# Patient Record
Sex: Female | Born: 1980
Health system: Southern US, Community
[De-identification: ages and names within clinical notes are randomized; demographics above are authoritative.]

## PROBLEM LIST (undated history)

## (undated) DIAGNOSIS — T8859XA Other complications of anesthesia, initial encounter: Secondary | ICD-10-CM

## (undated) DIAGNOSIS — E119 Type 2 diabetes mellitus without complications: Secondary | ICD-10-CM

## (undated) DIAGNOSIS — J45990 Exercise induced bronchospasm: Secondary | ICD-10-CM

## (undated) DIAGNOSIS — Z794 Long term (current) use of insulin: Secondary | ICD-10-CM

## (undated) DIAGNOSIS — F40240 Claustrophobia: Secondary | ICD-10-CM

## (undated) DIAGNOSIS — N200 Calculus of kidney: Secondary | ICD-10-CM

## (undated) DIAGNOSIS — Z87442 Personal history of urinary calculi: Secondary | ICD-10-CM

## (undated) DIAGNOSIS — R3915 Urgency of urination: Secondary | ICD-10-CM

## (undated) DIAGNOSIS — R350 Frequency of micturition: Secondary | ICD-10-CM

## (undated) DIAGNOSIS — K219 Gastro-esophageal reflux disease without esophagitis: Secondary | ICD-10-CM

## (undated) DIAGNOSIS — G629 Polyneuropathy, unspecified: Secondary | ICD-10-CM

## (undated) DIAGNOSIS — N281 Cyst of kidney, acquired: Secondary | ICD-10-CM

## (undated) DIAGNOSIS — Z8719 Personal history of other diseases of the digestive system: Secondary | ICD-10-CM

## (undated) DIAGNOSIS — Z973 Presence of spectacles and contact lenses: Secondary | ICD-10-CM

## (undated) DIAGNOSIS — F319 Bipolar disorder, unspecified: Secondary | ICD-10-CM

## (undated) DIAGNOSIS — R109 Unspecified abdominal pain: Secondary | ICD-10-CM

## (undated) DIAGNOSIS — T4145XA Adverse effect of unspecified anesthetic, initial encounter: Secondary | ICD-10-CM

## (undated) DIAGNOSIS — Z87440 Personal history of urinary (tract) infections: Secondary | ICD-10-CM

## (undated) HISTORY — PX: KNEE ARTHROSCOPY: SUR90

---
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
## 2001-07-12 ENCOUNTER — Emergency Department (HOSPITAL_COMMUNITY): Admission: EM | Admit: 2001-07-12 | Discharge: 2001-07-12 | Payer: Self-pay | Admitting: Emergency Medicine

## 2001-09-03 ENCOUNTER — Emergency Department (HOSPITAL_COMMUNITY): Admission: EM | Admit: 2001-09-03 | Discharge: 2001-09-03 | Payer: Self-pay

## 2002-02-03 ENCOUNTER — Emergency Department (HOSPITAL_COMMUNITY): Admission: EM | Admit: 2002-02-03 | Discharge: 2002-02-04 | Payer: Self-pay | Admitting: Emergency Medicine

## 2002-04-18 ENCOUNTER — Emergency Department (HOSPITAL_COMMUNITY): Admission: EM | Admit: 2002-04-18 | Discharge: 2002-04-18 | Payer: Self-pay | Admitting: *Deleted

## 2002-11-04 ENCOUNTER — Emergency Department (HOSPITAL_COMMUNITY): Admission: EM | Admit: 2002-11-04 | Discharge: 2002-11-04 | Payer: Self-pay | Admitting: Emergency Medicine

## 2003-10-06 ENCOUNTER — Emergency Department (HOSPITAL_COMMUNITY): Admission: EM | Admit: 2003-10-06 | Discharge: 2003-10-06 | Payer: Self-pay | Admitting: Emergency Medicine

## 2004-03-14 ENCOUNTER — Inpatient Hospital Stay (HOSPITAL_COMMUNITY): Admission: AD | Admit: 2004-03-14 | Discharge: 2004-03-14 | Payer: Self-pay | Admitting: Family Medicine

## 2004-08-09 ENCOUNTER — Other Ambulatory Visit: Admission: RE | Admit: 2004-08-09 | Discharge: 2004-08-09 | Payer: Self-pay | Admitting: Obstetrics and Gynecology

## 2004-12-28 ENCOUNTER — Inpatient Hospital Stay (HOSPITAL_COMMUNITY): Admission: AD | Admit: 2004-12-28 | Discharge: 2004-12-28 | Payer: Self-pay | Admitting: Obstetrics and Gynecology

## 2005-01-03 ENCOUNTER — Inpatient Hospital Stay (HOSPITAL_COMMUNITY): Admission: AD | Admit: 2005-01-03 | Discharge: 2005-01-04 | Payer: Self-pay | Admitting: Obstetrics and Gynecology

## 2005-01-22 ENCOUNTER — Inpatient Hospital Stay (HOSPITAL_COMMUNITY): Admission: AD | Admit: 2005-01-22 | Discharge: 2005-01-24 | Payer: Self-pay | Admitting: Obstetrics and Gynecology

## 2005-02-02 ENCOUNTER — Encounter: Admission: RE | Admit: 2005-02-02 | Discharge: 2005-03-04 | Payer: Self-pay | Admitting: Obstetrics and Gynecology

## 2005-09-07 ENCOUNTER — Other Ambulatory Visit: Admission: RE | Admit: 2005-09-07 | Discharge: 2005-09-07 | Payer: Self-pay | Admitting: Obstetrics and Gynecology

## 2006-02-09 ENCOUNTER — Emergency Department (HOSPITAL_COMMUNITY): Admission: EM | Admit: 2006-02-09 | Discharge: 2006-02-09 | Payer: Self-pay | Admitting: Emergency Medicine

## 2006-05-07 ENCOUNTER — Inpatient Hospital Stay (HOSPITAL_COMMUNITY): Admission: AD | Admit: 2006-05-07 | Discharge: 2006-05-07 | Payer: Self-pay | Admitting: Obstetrics and Gynecology

## 2006-10-02 ENCOUNTER — Encounter: Admission: RE | Admit: 2006-10-02 | Discharge: 2006-10-02 | Payer: Self-pay | Admitting: Obstetrics and Gynecology

## 2006-10-22 ENCOUNTER — Inpatient Hospital Stay (HOSPITAL_COMMUNITY): Admission: AD | Admit: 2006-10-22 | Discharge: 2006-10-23 | Payer: Self-pay | Admitting: Obstetrics and Gynecology

## 2006-11-16 ENCOUNTER — Inpatient Hospital Stay (HOSPITAL_COMMUNITY): Admission: AD | Admit: 2006-11-16 | Discharge: 2006-11-18 | Payer: Self-pay | Admitting: Obstetrics and Gynecology

## 2009-05-29 ENCOUNTER — Ambulatory Visit (HOSPITAL_COMMUNITY): Admission: RE | Admit: 2009-05-29 | Discharge: 2009-05-29 | Payer: Self-pay | Admitting: Obstetrics and Gynecology

## 2009-06-15 ENCOUNTER — Inpatient Hospital Stay (HOSPITAL_COMMUNITY): Admission: RE | Admit: 2009-06-15 | Discharge: 2009-06-17 | Payer: Self-pay | Admitting: Obstetrics and Gynecology

## 2009-09-05 ENCOUNTER — Emergency Department (HOSPITAL_COMMUNITY): Admission: EM | Admit: 2009-09-05 | Discharge: 2009-09-05 | Payer: Self-pay | Admitting: Emergency Medicine

## 2010-01-27 ENCOUNTER — Emergency Department (HOSPITAL_COMMUNITY): Admission: EM | Admit: 2010-01-27 | Discharge: 2010-01-27 | Payer: Self-pay | Admitting: Emergency Medicine

## 2010-01-28 ENCOUNTER — Ambulatory Visit (HOSPITAL_COMMUNITY): Admission: RE | Admit: 2010-01-28 | Discharge: 2010-01-28 | Payer: Self-pay | Admitting: Gastroenterology

## 2010-01-28 HISTORY — PX: ERCP W/ SPHICTEROTOMY: SHX1523

## 2010-01-29 ENCOUNTER — Inpatient Hospital Stay (HOSPITAL_COMMUNITY): Admission: EM | Admit: 2010-01-29 | Discharge: 2010-02-21 | Payer: Self-pay | Admitting: Emergency Medicine

## 2010-03-11 ENCOUNTER — Encounter: Admission: RE | Admit: 2010-03-11 | Discharge: 2010-03-11 | Payer: Self-pay | Admitting: Gastroenterology

## 2010-03-23 ENCOUNTER — Ambulatory Visit (HOSPITAL_COMMUNITY): Admission: RE | Admit: 2010-03-23 | Discharge: 2010-03-23 | Payer: Self-pay | Admitting: Gastroenterology

## 2010-03-25 ENCOUNTER — Emergency Department (HOSPITAL_COMMUNITY): Admission: EM | Admit: 2010-03-25 | Discharge: 2010-03-25 | Payer: Self-pay | Admitting: Emergency Medicine

## 2010-03-26 ENCOUNTER — Observation Stay (HOSPITAL_COMMUNITY): Admission: EM | Admit: 2010-03-26 | Discharge: 2010-03-28 | Payer: Self-pay | Admitting: Emergency Medicine

## 2010-07-24 ENCOUNTER — Encounter: Payer: Self-pay | Admitting: Obstetrics and Gynecology

## 2010-07-25 ENCOUNTER — Ambulatory Visit (HOSPITAL_COMMUNITY)
Admission: RE | Admit: 2010-07-25 | Discharge: 2010-07-25 | Payer: Self-pay | Source: Home / Self Care | Attending: General Surgery | Admitting: General Surgery

## 2010-07-25 HISTORY — PX: LAPAROSCOPIC CHOLECYSTECTOMY: SUR755

## 2010-07-25 LAB — COMPREHENSIVE METABOLIC PANEL
Albumin: 3.4 g/dL — ABNORMAL LOW (ref 3.5–5.2)
BUN: 5 mg/dL — ABNORMAL LOW (ref 6–23)
Calcium: 9.5 mg/dL (ref 8.4–10.5)
GFR calc Af Amer: >60 mL/min (ref >60)
Glucose, Bld: 109 mg/dL — ABNORMAL HIGH (ref 70–99)
Potassium: 4.2 mEq/L (ref 3.5–5.1)
Total Bilirubin: 0.3 mg/dL (ref 0.3–1.2)

## 2010-07-25 LAB — CBC
HCT: 36.5 % (ref 36.0–46.0)
Hemoglobin: 12 g/dL (ref 12.0–15.0)
MCHC: 32.9 g/dL (ref 30.0–36.0)
MCV: 86.5 fL (ref 78.0–100.0)

## 2010-07-25 LAB — SURGICAL PCR SCREEN
MRSA, PCR: NEGATIVE
Staphylococcus aureus: NEGATIVE

## 2010-07-26 NOTE — Op Note (Signed)
NAMEKENNIA, [name]              ACCOUNT NO.:  [id number]  MEDICAL RECORD NO.:  [id number]          PATIENT TYPE:  AMB  LOCATION:  SDS                          FACILITY:  [facility]  PHYSICIAN:  Anne Dare. Janee Ortiz, M.D.DATE OF BIRTH:  1981-03-29  DATE OF PROCEDURE:  07/25/2010 DATE OF DISCHARGE:  07/25/2010                              OPERATIVE REPORT   PREOPERATIVE DIAGNOSIS:  Biliary pancreatitis.  POSTOPERATIVE DIAGNOSIS:  Biliary pancreatitis.  PROCEDURE:  Laparoscopic cholecystectomy.  SURGEON:  Anne Dare. Janee Morn, MD.  ASSISTANT:  Anselm Pancoast. Zachery Dakins, MD.  HISTORY OF PRESENT ILLNESS:  [name] is a 30 year old female who had a common bile duct stone.  She had an ERCP with significant postoperative pancreatitis.  We were allowing this to resolve prior to removing her gallbladder when she also became pregnant.  She has continued to have right upper quadrant symptoms.  We will wait until her second trimester of pregnancy in order to proceed with surgery.  She presents today for elective cholecystectomy.  She has had some recent increase in her psychiatric issues; however, these have been brought under control with adjustments in her medication.  PROCEDURE IN DETAIL:  The patient was identified in the preop holding area.  Informed consent was obtained.  She received intravenous antibiotics.  She was brought to the operating room.  General endotracheal anesthesia was administered by anesthesia staff.  Her abdomen was prepped and draped in sterile fashion.  We did time-out procedure.  Supraumbilical region was infiltrated with 0.25% Marcaine with epinephrine.  Supraumbilical incision was made.  Subcutaneous tissues were dissected down revealing the anterior fascia.  This was divided sharply along the midline and the peritoneal cavity is entered under direct vision without difficulty and 0 Vicryl pursestring suture was placed around the fascial opening and the [facility] trocar  was inserted into the abdomen.  The abdomen was insufflated with carbon dioxide in a standard fashion.  Under direct vision, a 5-mm epigastric and two 5-mm lateral ports were placed.  Local anesthetic was used at these port sites as well.  Laparoscopic exploration revealed the gallbladder with some significant filmy adhesions from the omentum and duodenum.  These were carefully and gradually swept down.  They also were stuck up to the liver somewhat.  We were able to clear all of that away without causing any damage.  This allowed [place] to visualize the infundibulum.  Dissection began laterally and progressed medially.  We continued dissecting until a large window was created between the cystic duct and the infundibulum of the gallbladder and liver.  Once we had excellent visualization with critical view, decision was made to forego the cholangiogram as we had good visualization of the anatomy and due to the patient's pregnancy, so the cystic duct was clipped three times proximally and once distally and divided.  There was anterior branch in the cystic artery.  This was dissected around, clipped twice proximally, once distally, and divided. We then began taking the gallbladder off the liver bed with cautery.  We did encounter a posterior branch of cystic artery which was clipped twice proximally and divided distally with cautery.  The  gallbladder was taken off the liver bed and rest away with Bovie cautery and placed in an EndoCatch bag intact and removed from the abdomen via the infraumbilical port site.  We then irrigated the liver bed.  We ensured meticulous hemostasis.  All clips remained in good position.  The irrigation fluid returned clear.  Liver bed was checked one more time and it remained dry.  The ports were removed under direct vision. Pneumoperitoneum was released.  Infraumbilical fascia was closed by tying the 0 Vicryl pursestring suture with care not to trap any  intra- abdominal contents.  All four wounds were copiously irrigated.  The skin of each was closed with running 4-0 Vicryl subcuticular stitch followed by Dermabond.  Sponge, needle, and instrument counts were all correct. The patient tolerated the procedure well without apparent complication and was taken to recovery room in stable condition.     [name], M.D.     BET/MEDQ  D:  07/25/2010  T:  07/26/2010  Job:  [id number]  cc:   Shirley Friar, MD [facility]  Electronically Signed by Violeta Gelinas M.D. on 07/26/2010 03:59:35 PM

## 2010-09-15 LAB — CBC
HCT: 32.4 % — ABNORMAL LOW (ref 36.0–46.0)
HCT: 32.5 % — ABNORMAL LOW (ref 36.0–46.0)
HCT: 35.7 % — ABNORMAL LOW (ref 36.0–46.0)
Hemoglobin: 10.4 g/dL — ABNORMAL LOW (ref 12.0–15.0)
Hemoglobin: 11.2 g/dL — ABNORMAL LOW (ref 12.0–15.0)
Hemoglobin: 12 g/dL (ref 12.0–15.0)
Hemoglobin: 13.3 g/dL (ref 12.0–15.0)
MCH: 27.3 pg (ref 26.0–34.0)
MCH: 27.3 pg (ref 26.0–34.0)
MCHC: 31.2 g/dL (ref 30.0–36.0)
MCHC: 32 g/dL (ref 30.0–36.0)
MCHC: 32.1 g/dL (ref 30.0–36.0)
MCHC: 32.2 g/dL (ref 30.0–36.0)
MCV: 85 fL (ref 78.0–100.0)
RBC: 4.4 MIL/uL (ref 3.87–5.11)
RDW: 14 % (ref 11.5–15.5)
RDW: 14.2 % (ref 11.5–15.5)
RDW: 14.3 % (ref 11.5–15.5)
WBC: 5.1 10*3/uL (ref 4.0–10.5)
WBC: 7 10*3/uL (ref 4.0–10.5)

## 2010-09-15 LAB — COMPREHENSIVE METABOLIC PANEL
ALT: 26 U/L (ref 0–35)
ALT: 31 U/L (ref 0–35)
ALT: 32 U/L (ref 0–35)
ALT: 37 U/L — ABNORMAL HIGH (ref 0–35)
AST: 24 U/L (ref 0–37)
AST: 27 U/L (ref 0–37)
Albumin: 4 g/dL (ref 3.5–5.2)
Alkaline Phosphatase: 72 U/L (ref 39–117)
Alkaline Phosphatase: 74 U/L (ref 39–117)
BUN: 8 mg/dL (ref 6–23)
CO2: 23 mEq/L (ref 19–32)
CO2: 25 mEq/L (ref 19–32)
CO2: 26 mEq/L (ref 19–32)
Calcium: 8.7 mg/dL (ref 8.4–10.5)
Calcium: 9.5 mg/dL (ref 8.4–10.5)
Calcium: 9.8 mg/dL (ref 8.4–10.5)
Chloride: 108 mEq/L (ref 96–112)
Chloride: 110 mEq/L (ref 96–112)
Creatinine, Ser: 0.63 mg/dL (ref 0.4–1.2)
Creatinine, Ser: 0.67 mg/dL (ref 0.4–1.2)
GFR calc Af Amer: >60 mL/min (ref >60)
GFR calc Af Amer: >60 mL/min (ref >60)
GFR calc non Af Amer: >60 mL/min (ref >60)
GFR calc non Af Amer: >60 mL/min (ref >60)
GFR calc non Af Amer: >60 mL/min (ref >60)
GFR calc non Af Amer: >60 mL/min (ref >60)
Glucose, Bld: 103 mg/dL — ABNORMAL HIGH (ref 70–99)
Glucose, Bld: 112 mg/dL — ABNORMAL HIGH (ref 70–99)
Glucose, Bld: 83 mg/dL (ref 70–99)
Potassium: 3.8 mEq/L (ref 3.5–5.1)
Potassium: 3.9 mEq/L (ref 3.5–5.1)
Sodium: 137 mEq/L (ref 135–145)
Sodium: 137 mEq/L (ref 135–145)
Sodium: 141 mEq/L (ref 135–145)
Total Bilirubin: 0.6 mg/dL (ref 0.3–1.2)
Total Bilirubin: 1 mg/dL (ref 0.3–1.2)
Total Protein: 6.7 g/dL (ref 6.0–8.3)
Total Protein: 7.1 g/dL (ref 6.0–8.3)
Total Protein: 8.4 g/dL — ABNORMAL HIGH (ref 6.0–8.3)

## 2010-09-15 LAB — URINALYSIS, ROUTINE W REFLEX MICROSCOPIC
Bilirubin Urine: NEGATIVE
Glucose, UA: NEGATIVE mg/dL
Nitrite: NEGATIVE
Protein, ur: 30 mg/dL — AB
Specific Gravity, Urine: 1.027 (ref 1.005–1.030)
Urobilinogen, UA: 0.2 mg/dL (ref 0.0–1.0)
pH: 5.5 (ref 5.0–8.0)
pH: 5.5 (ref 5.0–8.0)

## 2010-09-15 LAB — URINE MICROSCOPIC-ADD ON

## 2010-09-15 LAB — CULTURE, BLOOD (ROUTINE X 2)
Culture  Setup Time: 201109241342
Culture: NO GROWTH

## 2010-09-15 LAB — DIFFERENTIAL
Basophils Absolute: 0 10*3/uL (ref 0.0–0.1)
Eosinophils Relative: 1 % (ref 0–5)
Lymphocytes Relative: 16 % (ref 12–46)
Lymphocytes Relative: 22 % (ref 12–46)
Lymphs Abs: 2.6 10*3/uL (ref 0.7–4.0)
Monocytes Absolute: 0.6 10*3/uL (ref 0.1–1.0)
Monocytes Relative: 5 % (ref 3–12)
Monocytes Relative: 6 % (ref 3–12)
Neutrophils Relative %: 70 % (ref 43–77)
Neutrophils Relative %: 78 % — ABNORMAL HIGH (ref 43–77)

## 2010-09-15 LAB — LIPASE, BLOOD
Lipase: 39 U/L (ref 11–59)
Lipase: 50 U/L (ref 11–59)
Lipase: 55 U/L (ref 11–59)
Lipase: 73 U/L — ABNORMAL HIGH (ref 11–59)

## 2010-09-15 LAB — POCT PREGNANCY, URINE: Preg Test, Ur: NEGATIVE

## 2010-09-16 LAB — BASIC METABOLIC PANEL
BUN: 6 mg/dL (ref 6–23)
BUN: 7 mg/dL (ref 6–23)
BUN: 7 mg/dL (ref 6–23)
CO2: 28 mEq/L (ref 19–32)
CO2: 30 mEq/L (ref 19–32)
CO2: 30 mEq/L (ref 19–32)
CO2: 30 mEq/L (ref 19–32)
Calcium: 8.7 mg/dL (ref 8.4–10.5)
Calcium: 9 mg/dL (ref 8.4–10.5)
Calcium: 9.1 mg/dL (ref 8.4–10.5)
Calcium: 9.2 mg/dL (ref 8.4–10.5)
Calcium: 9.4 mg/dL (ref 8.4–10.5)
Chloride: 105 mEq/L (ref 96–112)
Creatinine, Ser: 0.53 mg/dL (ref 0.4–1.2)
Creatinine, Ser: 0.59 mg/dL (ref 0.4–1.2)
Creatinine, Ser: 0.6 mg/dL (ref 0.4–1.2)
Creatinine, Ser: 0.67 mg/dL (ref 0.4–1.2)
GFR calc Af Amer: >60 mL/min (ref >60)
GFR calc non Af Amer: >60 mL/min (ref >60)
GFR calc non Af Amer: >60 mL/min (ref >60)
GFR calc non Af Amer: >60 mL/min (ref >60)
GFR calc non Af Amer: >60 mL/min (ref >60)
GFR calc non Af Amer: >60 mL/min (ref >60)
Glucose, Bld: 126 mg/dL — ABNORMAL HIGH (ref 70–99)
Glucose, Bld: 126 mg/dL — ABNORMAL HIGH (ref 70–99)
Glucose, Bld: 143 mg/dL — ABNORMAL HIGH (ref 70–99)
Glucose, Bld: 205 mg/dL — ABNORMAL HIGH (ref 70–99)
Glucose, Bld: 208 mg/dL — ABNORMAL HIGH (ref 70–99)
Potassium: 3.8 mEq/L (ref 3.5–5.1)
Sodium: 135 mEq/L (ref 135–145)
Sodium: 137 mEq/L (ref 135–145)
Sodium: 138 mEq/L (ref 135–145)
Sodium: 138 mEq/L (ref 135–145)
Sodium: 139 mEq/L (ref 135–145)

## 2010-09-16 LAB — DIFFERENTIAL
Basophils Absolute: 0 10*3/uL (ref 0.0–0.1)
Basophils Absolute: 0 10*3/uL (ref 0.0–0.1)
Basophils Absolute: 0 10*3/uL (ref 0.0–0.1)
Basophils Absolute: 0 10*3/uL (ref 0.0–0.1)
Basophils Absolute: 0 10*3/uL (ref 0.0–0.1)
Basophils Absolute: 0 10*3/uL (ref 0.0–0.1)
Basophils Absolute: 0 10*3/uL (ref 0.0–0.1)
Basophils Relative: 0 % (ref 0–1)
Basophils Relative: 0 % (ref 0–1)
Basophils Relative: 0 % (ref 0–1)
Basophils Relative: 0 % (ref 0–1)
Basophils Relative: 0 % (ref 0–1)
Basophils Relative: 1 % (ref 0–1)
Basophils Relative: 0 % (ref 0–1)
Basophils Relative: 0 % (ref 0–1)
Basophils Relative: 0 % (ref 0–1)
Basophils Relative: 0 % (ref 0–1)
Basophils Relative: 0 % (ref 0–1)
Basophils Relative: 1 % (ref 0–1)
Basophils Relative: 1 % (ref 0–1)
Eosinophils Absolute: 0.1 10*3/uL (ref 0.0–0.7)
Eosinophils Absolute: 0.1 10*3/uL (ref 0.0–0.7)
Eosinophils Absolute: 0.2 10*3/uL (ref 0.0–0.7)
Eosinophils Absolute: 0.2 10*3/uL (ref 0.0–0.7)
Eosinophils Absolute: 0.2 10*3/uL (ref 0.0–0.7)
Eosinophils Absolute: 0.2 10*3/uL (ref 0.0–0.7)
Eosinophils Absolute: 0.2 10*3/uL (ref 0.0–0.7)
Eosinophils Absolute: 0 10*3/uL (ref 0.0–0.7)
Eosinophils Absolute: 0 10*3/uL (ref 0.0–0.7)
Eosinophils Absolute: 0 10*3/uL (ref 0.0–0.7)
Eosinophils Absolute: 0.1 10*3/uL (ref 0.0–0.7)
Eosinophils Absolute: 0.2 10*3/uL (ref 0.0–0.7)
Eosinophils Relative: 3 % (ref 0–5)
Eosinophils Relative: 3 % (ref 0–5)
Eosinophils Relative: 4 % (ref 0–5)
Eosinophils Relative: 0 % (ref 0–5)
Eosinophils Relative: 0 % (ref 0–5)
Eosinophils Relative: 0 % (ref 0–5)
Eosinophils Relative: 0 % (ref 0–5)
Eosinophils Relative: 1 % (ref 0–5)
Eosinophils Relative: 1 % (ref 0–5)
Eosinophils Relative: 2 % (ref 0–5)
Eosinophils Relative: 3 % (ref 0–5)
Lymphocytes Relative: 28 % (ref 12–46)
Lymphocytes Relative: 39 % (ref 12–46)
Lymphocytes Relative: 40 % (ref 12–46)
Lymphocytes Relative: 11 % — ABNORMAL LOW (ref 12–46)
Lymphocytes Relative: 12 % (ref 12–46)
Lymphocytes Relative: 12 % (ref 12–46)
Lymphocytes Relative: 15 % (ref 12–46)
Lymphs Abs: 1.7 10*3/uL (ref 0.7–4.0)
Lymphs Abs: 1.4 10*3/uL (ref 0.7–4.0)
Lymphs Abs: 1.5 10*3/uL (ref 0.7–4.0)
Lymphs Abs: 1.5 10*3/uL (ref 0.7–4.0)
Lymphs Abs: 1.8 10*3/uL (ref 0.7–4.0)
Lymphs Abs: 1.9 10*3/uL (ref 0.7–4.0)
Lymphs Abs: 2 10*3/uL (ref 0.7–4.0)
Lymphs Abs: 2 10*3/uL (ref 0.7–4.0)
Monocytes Absolute: 0.4 10*3/uL (ref 0.1–1.0)
Monocytes Absolute: 0.6 10*3/uL (ref 0.1–1.0)
Monocytes Absolute: 0.6 10*3/uL (ref 0.1–1.0)
Monocytes Absolute: 0.6 10*3/uL (ref 0.1–1.0)
Monocytes Absolute: 0.7 10*3/uL (ref 0.1–1.0)
Monocytes Absolute: 0.9 10*3/uL (ref 0.1–1.0)
Monocytes Absolute: 1 10*3/uL (ref 0.1–1.0)
Monocytes Absolute: 1.3 10*3/uL — ABNORMAL HIGH (ref 0.1–1.0)
Monocytes Absolute: 1.3 10*3/uL — ABNORMAL HIGH (ref 0.1–1.0)
Monocytes Relative: 10 % (ref 3–12)
Monocytes Relative: 7 % (ref 3–12)
Monocytes Relative: 9 % (ref 3–12)
Monocytes Relative: 9 % (ref 3–12)
Monocytes Relative: 3 % (ref 3–12)
Monocytes Relative: 6 % (ref 3–12)
Monocytes Relative: 7 % (ref 3–12)
Monocytes Relative: 8 % (ref 3–12)
Monocytes Relative: 8 % (ref 3–12)
Monocytes Relative: 8 % (ref 3–12)
Monocytes Relative: 8 % (ref 3–12)
Monocytes Relative: 9 % (ref 3–12)
Monocytes Relative: 9 % (ref 3–12)
Monocytes Relative: 9 % (ref 3–12)
Monocytes Relative: 9 % (ref 3–12)
Neutro Abs: 2 10*3/uL (ref 1.7–7.7)
Neutro Abs: 2.2 10*3/uL (ref 1.7–7.7)
Neutro Abs: 2.9 10*3/uL (ref 1.7–7.7)
Neutro Abs: 3 10*3/uL (ref 1.7–7.7)
Neutro Abs: 4.8 10*3/uL (ref 1.7–7.7)
Neutro Abs: 5.9 10*3/uL (ref 1.7–7.7)
Neutro Abs: 10.4 10*3/uL — ABNORMAL HIGH (ref 1.7–7.7)
Neutro Abs: 12 10*3/uL — ABNORMAL HIGH (ref 1.7–7.7)
Neutro Abs: 13.1 10*3/uL — ABNORMAL HIGH (ref 1.7–7.7)
Neutro Abs: 13.1 10*3/uL — ABNORMAL HIGH (ref 1.7–7.7)
Neutro Abs: 15.5 10*3/uL — ABNORMAL HIGH (ref 1.7–7.7)
Neutro Abs: 17.2 10*3/uL — ABNORMAL HIGH (ref 1.7–7.7)
Neutro Abs: 19 10*3/uL — ABNORMAL HIGH (ref 1.7–7.7)
Neutro Abs: 5.1 10*3/uL (ref 1.7–7.7)
Neutro Abs: 9 10*3/uL — ABNORMAL HIGH (ref 1.7–7.7)
Neutrophils Relative %: 47 % (ref 43–77)
Neutrophils Relative %: 50 % (ref 43–77)
Neutrophils Relative %: 56 % (ref 43–77)
Neutrophils Relative %: 57 % (ref 43–77)
Neutrophils Relative %: 70 % (ref 43–77)
Neutrophils Relative %: 65 % (ref 43–77)
Neutrophils Relative %: 76 % (ref 43–77)
Neutrophils Relative %: 79 % — ABNORMAL HIGH (ref 43–77)
Neutrophils Relative %: 79 % — ABNORMAL HIGH (ref 43–77)
Neutrophils Relative %: 83 % — ABNORMAL HIGH (ref 43–77)
Neutrophils Relative %: 86 % — ABNORMAL HIGH (ref 43–77)
Neutrophils Relative %: 89 % — ABNORMAL HIGH (ref 43–77)
WBC Morphology: INCREASED
WBC Morphology: INCREASED

## 2010-09-16 LAB — CBC
HCT: 27.7 % — ABNORMAL LOW (ref 36.0–46.0)
HCT: 30 % — ABNORMAL LOW (ref 36.0–46.0)
HCT: 27.6 % — ABNORMAL LOW (ref 36.0–46.0)
HCT: 28.9 % — ABNORMAL LOW (ref 36.0–46.0)
HCT: 30.1 % — ABNORMAL LOW (ref 36.0–46.0)
HCT: 30.4 % — ABNORMAL LOW (ref 36.0–46.0)
HCT: 32.1 % — ABNORMAL LOW (ref 36.0–46.0)
HCT: 33 % — ABNORMAL LOW (ref 36.0–46.0)
HCT: 33.8 % — ABNORMAL LOW (ref 36.0–46.0)
HCT: 34.3 % — ABNORMAL LOW (ref 36.0–46.0)
HCT: 35.6 % — ABNORMAL LOW (ref 36.0–46.0)
Hemoglobin: 8.8 g/dL — ABNORMAL LOW (ref 12.0–15.0)
Hemoglobin: 9.1 g/dL — ABNORMAL LOW (ref 12.0–15.0)
Hemoglobin: 9.2 g/dL — ABNORMAL LOW (ref 12.0–15.0)
Hemoglobin: 10 g/dL — ABNORMAL LOW (ref 12.0–15.0)
Hemoglobin: 10.6 g/dL — ABNORMAL LOW (ref 12.0–15.0)
Hemoglobin: 11 g/dL — ABNORMAL LOW (ref 12.0–15.0)
Hemoglobin: 11.7 g/dL — ABNORMAL LOW (ref 12.0–15.0)
Hemoglobin: 9.2 g/dL — ABNORMAL LOW (ref 12.0–15.0)
Hemoglobin: 9.6 g/dL — ABNORMAL LOW (ref 12.0–15.0)
MCH: 27.5 pg (ref 26.0–34.0)
MCH: 27.6 pg (ref 26.0–34.0)
MCH: 28.2 pg (ref 26.0–34.0)
MCH: 28.4 pg (ref 26.0–34.0)
MCH: 28.8 pg (ref 26.0–34.0)
MCH: 28.6 pg (ref 26.0–34.0)
MCH: 28.7 pg (ref 26.0–34.0)
MCH: 29.2 pg (ref 26.0–34.0)
MCH: 29.2 pg (ref 26.0–34.0)
MCH: 30.4 pg (ref 26.0–34.0)
MCHC: 31.2 g/dL (ref 30.0–36.0)
MCHC: 31.5 g/dL (ref 30.0–36.0)
MCHC: 31.8 g/dL (ref 30.0–36.0)
MCHC: 32.1 g/dL (ref 30.0–36.0)
MCHC: 32.4 g/dL (ref 30.0–36.0)
MCHC: 31.4 g/dL (ref 30.0–36.0)
MCHC: 31.6 g/dL (ref 30.0–36.0)
MCHC: 31.7 g/dL (ref 30.0–36.0)
MCHC: 31.8 g/dL (ref 30.0–36.0)
MCHC: 31.9 g/dL (ref 30.0–36.0)
MCHC: 32.1 g/dL (ref 30.0–36.0)
MCHC: 32.9 g/dL (ref 30.0–36.0)
MCV: 87.1 fL (ref 78.0–100.0)
MCV: 89 fL (ref 78.0–100.0)
MCV: 89 fL (ref 78.0–100.0)
MCV: 89.2 fL (ref 78.0–100.0)
MCV: 88.9 fL (ref 78.0–100.0)
MCV: 88.9 fL (ref 78.0–100.0)
MCV: 89.1 fL (ref 78.0–100.0)
MCV: 89.3 fL (ref 78.0–100.0)
MCV: 89.6 fL (ref 78.0–100.0)
MCV: 89.8 fL (ref 78.0–100.0)
Platelets: 316 10*3/uL (ref 150–400)
Platelets: 330 10*3/uL (ref 150–400)
Platelets: 391 10*3/uL (ref 150–400)
Platelets: 420 10*3/uL — ABNORMAL HIGH (ref 150–400)
Platelets: 452 10*3/uL — ABNORMAL HIGH (ref 150–400)
Platelets: 203 10*3/uL (ref 150–400)
Platelets: 287 10*3/uL (ref 150–400)
Platelets: 315 10*3/uL (ref 150–400)
Platelets: 334 10*3/uL (ref 150–400)
Platelets: 370 10*3/uL (ref 150–400)
Platelets: 439 10*3/uL — ABNORMAL HIGH (ref 150–400)
Platelets: 468 10*3/uL — ABNORMAL HIGH (ref 150–400)
Platelets: 491 10*3/uL — ABNORMAL HIGH (ref 150–400)
Platelets: 498 10*3/uL — ABNORMAL HIGH (ref 150–400)
RBC: 3.3 MIL/uL — ABNORMAL LOW (ref 3.87–5.11)
RBC: 3.37 MIL/uL — ABNORMAL LOW (ref 3.87–5.11)
RBC: 3.18 MIL/uL — ABNORMAL LOW (ref 3.87–5.11)
RBC: 3.22 MIL/uL — ABNORMAL LOW (ref 3.87–5.11)
RBC: 3.33 MIL/uL — ABNORMAL LOW (ref 3.87–5.11)
RBC: 3.41 MIL/uL — ABNORMAL LOW (ref 3.87–5.11)
RBC: 3.63 MIL/uL — ABNORMAL LOW (ref 3.87–5.11)
RBC: 3.69 MIL/uL — ABNORMAL LOW (ref 3.87–5.11)
RBC: 3.83 MIL/uL — ABNORMAL LOW (ref 3.87–5.11)
RBC: 4 MIL/uL (ref 3.87–5.11)
RBC: 4.17 MIL/uL (ref 3.87–5.11)
RDW: 12.7 % (ref 11.5–15.5)
RDW: 12.9 % (ref 11.5–15.5)
RDW: 13 % (ref 11.5–15.5)
RDW: 13.1 % (ref 11.5–15.5)
RDW: 13.2 % (ref 11.5–15.5)
RDW: 13.5 % (ref 11.5–15.5)
RDW: 13.6 % (ref 11.5–15.5)
RDW: 13.7 % (ref 11.5–15.5)
RDW: 13.9 % (ref 11.5–15.5)
RDW: 13.9 % (ref 11.5–15.5)
RDW: 14.1 % (ref 11.5–15.5)
RDW: 14.1 % (ref 11.5–15.5)
RDW: 14.1 % (ref 11.5–15.5)
WBC: 4.3 10*3/uL (ref 4.0–10.5)
WBC: 4.4 10*3/uL (ref 4.0–10.5)
WBC: 6.6 10*3/uL (ref 4.0–10.5)
WBC: 7.7 10*3/uL (ref 4.0–10.5)
WBC: 10 10*3/uL (ref 4.0–10.5)
WBC: 13.6 10*3/uL — ABNORMAL HIGH (ref 4.0–10.5)
WBC: 14 10*3/uL — ABNORMAL HIGH (ref 4.0–10.5)
WBC: 16.6 10*3/uL — ABNORMAL HIGH (ref 4.0–10.5)
WBC: 19.7 10*3/uL — ABNORMAL HIGH (ref 4.0–10.5)
WBC: 21.3 10*3/uL — ABNORMAL HIGH (ref 4.0–10.5)
WBC: 22.1 10*3/uL — ABNORMAL HIGH (ref 4.0–10.5)
WBC: 6.2 10*3/uL (ref 4.0–10.5)

## 2010-09-16 LAB — COMPREHENSIVE METABOLIC PANEL
ALT: 29 U/L (ref 0–35)
ALT: 30 U/L (ref 0–35)
ALT: 36 U/L — ABNORMAL HIGH (ref 0–35)
ALT: 36 U/L — ABNORMAL HIGH (ref 0–35)
ALT: 47 U/L — ABNORMAL HIGH (ref 0–35)
ALT: 66 U/L — ABNORMAL HIGH (ref 0–35)
ALT: 75 U/L — ABNORMAL HIGH (ref 0–35)
ALT: 76 U/L — ABNORMAL HIGH (ref 0–35)
AST: 27 U/L (ref 0–37)
AST: 28 U/L (ref 0–37)
AST: 32 U/L (ref 0–37)
AST: 33 U/L (ref 0–37)
AST: 50 U/L — ABNORMAL HIGH (ref 0–37)
AST: 54 U/L — ABNORMAL HIGH (ref 0–37)
AST: 67 U/L — ABNORMAL HIGH (ref 0–37)
AST: 74 U/L — ABNORMAL HIGH (ref 0–37)
AST: 91 U/L — ABNORMAL HIGH (ref 0–37)
Albumin: 2.9 g/dL — ABNORMAL LOW (ref 3.5–5.2)
Albumin: 2.2 g/dL — ABNORMAL LOW (ref 3.5–5.2)
Albumin: 2.3 g/dL — ABNORMAL LOW (ref 3.5–5.2)
Albumin: 2.3 g/dL — ABNORMAL LOW (ref 3.5–5.2)
Albumin: 2.4 g/dL — ABNORMAL LOW (ref 3.5–5.2)
Albumin: 2.5 g/dL — ABNORMAL LOW (ref 3.5–5.2)
Albumin: 2.6 g/dL — ABNORMAL LOW (ref 3.5–5.2)
Alkaline Phosphatase: 98 U/L (ref 39–117)
Alkaline Phosphatase: 111 U/L (ref 39–117)
Alkaline Phosphatase: 111 U/L (ref 39–117)
Alkaline Phosphatase: 132 U/L — ABNORMAL HIGH (ref 39–117)
Alkaline Phosphatase: 136 U/L — ABNORMAL HIGH (ref 39–117)
Alkaline Phosphatase: 153 U/L — ABNORMAL HIGH (ref 39–117)
Alkaline Phosphatase: 76 U/L (ref 39–117)
Alkaline Phosphatase: 86 U/L (ref 39–117)
BUN: 7 mg/dL (ref 6–23)
BUN: 2 mg/dL — ABNORMAL LOW (ref 6–23)
BUN: 5 mg/dL — ABNORMAL LOW (ref 6–23)
BUN: 5 mg/dL — ABNORMAL LOW (ref 6–23)
BUN: 7 mg/dL (ref 6–23)
BUN: <1 mg/dL — ABNORMAL LOW (ref 6–23)
BUN: <1 mg/dL — ABNORMAL LOW (ref 6–23)
CO2: 23 mEq/L (ref 19–32)
CO2: 26 mEq/L (ref 19–32)
CO2: 26 mEq/L (ref 19–32)
CO2: 29 mEq/L (ref 19–32)
CO2: 32 mEq/L (ref 19–32)
Calcium: 7.5 mg/dL — ABNORMAL LOW (ref 8.4–10.5)
Calcium: 7.8 mg/dL — ABNORMAL LOW (ref 8.4–10.5)
Calcium: 8 mg/dL — ABNORMAL LOW (ref 8.4–10.5)
Calcium: 8.2 mg/dL — ABNORMAL LOW (ref 8.4–10.5)
Calcium: 8.4 mg/dL (ref 8.4–10.5)
Calcium: 8.7 mg/dL (ref 8.4–10.5)
Calcium: 8.7 mg/dL (ref 8.4–10.5)
Calcium: 9.1 mg/dL (ref 8.4–10.5)
Chloride: 96 mEq/L (ref 96–112)
Chloride: 100 mEq/L (ref 96–112)
Chloride: 101 mEq/L (ref 96–112)
Chloride: 104 mEq/L (ref 96–112)
Chloride: 95 mEq/L — ABNORMAL LOW (ref 96–112)
Chloride: 97 mEq/L (ref 96–112)
Chloride: 97 mEq/L (ref 96–112)
Creatinine, Ser: 0.52 mg/dL (ref 0.4–1.2)
Creatinine, Ser: 0.53 mg/dL (ref 0.4–1.2)
Creatinine, Ser: 0.57 mg/dL (ref 0.4–1.2)
Creatinine, Ser: 0.6 mg/dL (ref 0.4–1.2)
Creatinine, Ser: 0.79 mg/dL (ref 0.4–1.2)
GFR calc Af Amer: >60 mL/min (ref >60)
GFR calc Af Amer: >60 mL/min (ref >60)
GFR calc Af Amer: >60 mL/min (ref >60)
GFR calc Af Amer: >60 mL/min (ref >60)
GFR calc Af Amer: >60 mL/min (ref >60)
GFR calc Af Amer: >60 mL/min (ref >60)
GFR calc Af Amer: >60 mL/min (ref >60)
GFR calc Af Amer: >60 mL/min (ref >60)
GFR calc Af Amer: >60 mL/min (ref >60)
GFR calc Af Amer: >60 mL/min (ref >60)
GFR calc non Af Amer: >60 mL/min (ref >60)
GFR calc non Af Amer: >60 mL/min (ref >60)
GFR calc non Af Amer: >60 mL/min (ref >60)
GFR calc non Af Amer: >60 mL/min (ref >60)
GFR calc non Af Amer: >60 mL/min (ref >60)
GFR calc non Af Amer: >60 mL/min (ref >60)
Glucose, Bld: 230 mg/dL — ABNORMAL HIGH (ref 70–99)
Glucose, Bld: 108 mg/dL — ABNORMAL HIGH (ref 70–99)
Glucose, Bld: 122 mg/dL — ABNORMAL HIGH (ref 70–99)
Glucose, Bld: 147 mg/dL — ABNORMAL HIGH (ref 70–99)
Glucose, Bld: 173 mg/dL — ABNORMAL HIGH (ref 70–99)
Glucose, Bld: 188 mg/dL — ABNORMAL HIGH (ref 70–99)
Glucose, Bld: 232 mg/dL — ABNORMAL HIGH (ref 70–99)
Glucose, Bld: 234 mg/dL — ABNORMAL HIGH (ref 70–99)
Glucose, Bld: 307 mg/dL — ABNORMAL HIGH (ref 70–99)
Potassium: 4.2 mEq/L (ref 3.5–5.1)
Potassium: 2.9 mEq/L — ABNORMAL LOW (ref 3.5–5.1)
Potassium: 2.9 mEq/L — ABNORMAL LOW (ref 3.5–5.1)
Potassium: 3.1 mEq/L — ABNORMAL LOW (ref 3.5–5.1)
Potassium: 3.1 mEq/L — ABNORMAL LOW (ref 3.5–5.1)
Potassium: 3.2 mEq/L — ABNORMAL LOW (ref 3.5–5.1)
Potassium: 3.7 mEq/L (ref 3.5–5.1)
Potassium: 4.3 mEq/L (ref 3.5–5.1)
Potassium: 4.5 mEq/L (ref 3.5–5.1)
Sodium: 137 mEq/L (ref 135–145)
Sodium: 130 mEq/L — ABNORMAL LOW (ref 135–145)
Sodium: 135 mEq/L (ref 135–145)
Sodium: 135 mEq/L (ref 135–145)
Sodium: 137 mEq/L (ref 135–145)
Sodium: 137 mEq/L (ref 135–145)
Sodium: 138 mEq/L (ref 135–145)
Sodium: 138 mEq/L (ref 135–145)
Sodium: 138 mEq/L (ref 135–145)
Sodium: 139 mEq/L (ref 135–145)
Total Bilirubin: 0.4 mg/dL (ref 0.3–1.2)
Total Bilirubin: 0.4 mg/dL (ref 0.3–1.2)
Total Bilirubin: 0.7 mg/dL (ref 0.3–1.2)
Total Bilirubin: 0.9 mg/dL (ref 0.3–1.2)
Total Bilirubin: 1.1 mg/dL (ref 0.3–1.2)
Total Bilirubin: 1.3 mg/dL — ABNORMAL HIGH (ref 0.3–1.2)
Total Protein: 5.5 g/dL — ABNORMAL LOW (ref 6.0–8.3)
Total Protein: 6.1 g/dL (ref 6.0–8.3)
Total Protein: 6.2 g/dL (ref 6.0–8.3)
Total Protein: 6.2 g/dL (ref 6.0–8.3)
Total Protein: 6.2 g/dL (ref 6.0–8.3)
Total Protein: 6.4 g/dL (ref 6.0–8.3)
Total Protein: 6.5 g/dL (ref 6.0–8.3)
Total Protein: 6.6 g/dL (ref 6.0–8.3)
Total Protein: 6.6 g/dL (ref 6.0–8.3)
Total Protein: 6.9 g/dL (ref 6.0–8.3)

## 2010-09-16 LAB — LIPASE, BLOOD
Lipase: 101 U/L — ABNORMAL HIGH (ref 11–59)
Lipase: 107 U/L — ABNORMAL HIGH (ref 11–59)
Lipase: 89 U/L — ABNORMAL HIGH (ref 11–59)
Lipase: 30 U/L (ref 11–59)
Lipase: 396 U/L — ABNORMAL HIGH (ref 11–59)
Lipase: 60 U/L — ABNORMAL HIGH (ref 11–59)
Lipase: 84 U/L — ABNORMAL HIGH (ref 11–59)
Lipase: 87 U/L — ABNORMAL HIGH (ref 11–59)
Lipase: 89 U/L — ABNORMAL HIGH (ref 11–59)

## 2010-09-16 LAB — GLUCOSE, CAPILLARY
Glucose-Capillary: 104 mg/dL — ABNORMAL HIGH (ref 70–99)
Glucose-Capillary: 112 mg/dL — ABNORMAL HIGH (ref 70–99)
Glucose-Capillary: 117 mg/dL — ABNORMAL HIGH (ref 70–99)
Glucose-Capillary: 117 mg/dL — ABNORMAL HIGH (ref 70–99)
Glucose-Capillary: 126 mg/dL — ABNORMAL HIGH (ref 70–99)
Glucose-Capillary: 130 mg/dL — ABNORMAL HIGH (ref 70–99)
Glucose-Capillary: 147 mg/dL — ABNORMAL HIGH (ref 70–99)
Glucose-Capillary: 147 mg/dL — ABNORMAL HIGH (ref 70–99)
Glucose-Capillary: 149 mg/dL — ABNORMAL HIGH (ref 70–99)
Glucose-Capillary: 155 mg/dL — ABNORMAL HIGH (ref 70–99)
Glucose-Capillary: 155 mg/dL — ABNORMAL HIGH (ref 70–99)
Glucose-Capillary: 164 mg/dL — ABNORMAL HIGH (ref 70–99)
Glucose-Capillary: 168 mg/dL — ABNORMAL HIGH (ref 70–99)
Glucose-Capillary: 174 mg/dL — ABNORMAL HIGH (ref 70–99)
Glucose-Capillary: 174 mg/dL — ABNORMAL HIGH (ref 70–99)
Glucose-Capillary: 176 mg/dL — ABNORMAL HIGH (ref 70–99)
Glucose-Capillary: 186 mg/dL — ABNORMAL HIGH (ref 70–99)
Glucose-Capillary: 188 mg/dL — ABNORMAL HIGH (ref 70–99)
Glucose-Capillary: 203 mg/dL — ABNORMAL HIGH (ref 70–99)
Glucose-Capillary: 206 mg/dL — ABNORMAL HIGH (ref 70–99)
Glucose-Capillary: 210 mg/dL — ABNORMAL HIGH (ref 70–99)
Glucose-Capillary: 242 mg/dL — ABNORMAL HIGH (ref 70–99)
Glucose-Capillary: 97 mg/dL (ref 70–99)
Glucose-Capillary: 109 mg/dL — ABNORMAL HIGH (ref 70–99)
Glucose-Capillary: 121 mg/dL — ABNORMAL HIGH (ref 70–99)
Glucose-Capillary: 121 mg/dL — ABNORMAL HIGH (ref 70–99)
Glucose-Capillary: 122 mg/dL — ABNORMAL HIGH (ref 70–99)
Glucose-Capillary: 125 mg/dL — ABNORMAL HIGH (ref 70–99)
Glucose-Capillary: 126 mg/dL — ABNORMAL HIGH (ref 70–99)
Glucose-Capillary: 130 mg/dL — ABNORMAL HIGH (ref 70–99)
Glucose-Capillary: 136 mg/dL — ABNORMAL HIGH (ref 70–99)
Glucose-Capillary: 137 mg/dL — ABNORMAL HIGH (ref 70–99)
Glucose-Capillary: 140 mg/dL — ABNORMAL HIGH (ref 70–99)
Glucose-Capillary: 141 mg/dL — ABNORMAL HIGH (ref 70–99)
Glucose-Capillary: 142 mg/dL — ABNORMAL HIGH (ref 70–99)
Glucose-Capillary: 145 mg/dL — ABNORMAL HIGH (ref 70–99)
Glucose-Capillary: 147 mg/dL — ABNORMAL HIGH (ref 70–99)
Glucose-Capillary: 147 mg/dL — ABNORMAL HIGH (ref 70–99)
Glucose-Capillary: 151 mg/dL — ABNORMAL HIGH (ref 70–99)
Glucose-Capillary: 153 mg/dL — ABNORMAL HIGH (ref 70–99)
Glucose-Capillary: 157 mg/dL — ABNORMAL HIGH (ref 70–99)
Glucose-Capillary: 159 mg/dL — ABNORMAL HIGH (ref 70–99)
Glucose-Capillary: 164 mg/dL — ABNORMAL HIGH (ref 70–99)
Glucose-Capillary: 170 mg/dL — ABNORMAL HIGH (ref 70–99)
Glucose-Capillary: 174 mg/dL — ABNORMAL HIGH (ref 70–99)
Glucose-Capillary: 180 mg/dL — ABNORMAL HIGH (ref 70–99)
Glucose-Capillary: 181 mg/dL — ABNORMAL HIGH (ref 70–99)
Glucose-Capillary: 182 mg/dL — ABNORMAL HIGH (ref 70–99)
Glucose-Capillary: 190 mg/dL — ABNORMAL HIGH (ref 70–99)
Glucose-Capillary: 191 mg/dL — ABNORMAL HIGH (ref 70–99)
Glucose-Capillary: 191 mg/dL — ABNORMAL HIGH (ref 70–99)
Glucose-Capillary: 195 mg/dL — ABNORMAL HIGH (ref 70–99)
Glucose-Capillary: 199 mg/dL — ABNORMAL HIGH (ref 70–99)
Glucose-Capillary: 199 mg/dL — ABNORMAL HIGH (ref 70–99)
Glucose-Capillary: 211 mg/dL — ABNORMAL HIGH (ref 70–99)
Glucose-Capillary: 216 mg/dL — ABNORMAL HIGH (ref 70–99)
Glucose-Capillary: 224 mg/dL — ABNORMAL HIGH (ref 70–99)
Glucose-Capillary: 226 mg/dL — ABNORMAL HIGH (ref 70–99)
Glucose-Capillary: 230 mg/dL — ABNORMAL HIGH (ref 70–99)
Glucose-Capillary: 233 mg/dL — ABNORMAL HIGH (ref 70–99)
Glucose-Capillary: 233 mg/dL — ABNORMAL HIGH (ref 70–99)
Glucose-Capillary: 238 mg/dL — ABNORMAL HIGH (ref 70–99)
Glucose-Capillary: 262 mg/dL — ABNORMAL HIGH (ref 70–99)
Glucose-Capillary: 268 mg/dL — ABNORMAL HIGH (ref 70–99)
Glucose-Capillary: 294 mg/dL — ABNORMAL HIGH (ref 70–99)
Glucose-Capillary: 294 mg/dL — ABNORMAL HIGH (ref 70–99)
Glucose-Capillary: 95 mg/dL (ref 70–99)

## 2010-09-16 LAB — MAGNESIUM
Magnesium: 2.1 mg/dL (ref 1.5–2.5)
Magnesium: 2.2 mg/dL (ref 1.5–2.5)

## 2010-09-16 LAB — URINALYSIS, ROUTINE W REFLEX MICROSCOPIC
Bilirubin Urine: NEGATIVE
Hgb urine dipstick: NEGATIVE
Nitrite: NEGATIVE
Protein, ur: NEGATIVE mg/dL
Specific Gravity, Urine: 1.008 (ref 1.005–1.030)
Urobilinogen, UA: 1 mg/dL (ref 0.0–1.0)

## 2010-09-16 LAB — CK TOTAL AND CKMB (NOT AT ARMC)
Relative Index: INVALID (ref 0.0–2.5)
Total CK: 24 U/L (ref 7–177)

## 2010-09-16 LAB — URINE CULTURE

## 2010-09-16 LAB — CULTURE, BLOOD (ROUTINE X 2)
Culture: NO GROWTH
Culture: NO GROWTH

## 2010-09-16 LAB — RPR: RPR Ser Ql: NONREACTIVE

## 2010-09-16 LAB — PHOSPHORUS: Phosphorus: 1.8 mg/dL — ABNORMAL LOW (ref 2.3–4.6)

## 2010-09-17 LAB — COMPREHENSIVE METABOLIC PANEL
ALT: 156 U/L — ABNORMAL HIGH (ref 0–35)
AST: 178 U/L — ABNORMAL HIGH (ref 0–37)
Albumin: 3.6 g/dL (ref 3.5–5.2)
Albumin: 4 g/dL (ref 3.5–5.2)
Alkaline Phosphatase: 152 U/L — ABNORMAL HIGH (ref 39–117)
Alkaline Phosphatase: 171 U/L — ABNORMAL HIGH (ref 39–117)
BUN: 23 mg/dL (ref 6–23)
BUN: 36 mg/dL — ABNORMAL HIGH (ref 6–23)
BUN: 9 mg/dL (ref 6–23)
CO2: 21 mEq/L (ref 19–32)
CO2: 24 mEq/L (ref 19–32)
Calcium: 8.1 mg/dL — ABNORMAL LOW (ref 8.4–10.5)
Chloride: 101 mEq/L (ref 96–112)
Chloride: 109 mEq/L (ref 96–112)
Creatinine, Ser: 1.53 mg/dL — ABNORMAL HIGH (ref 0.4–1.2)
GFR calc non Af Amer: 40 mL/min — ABNORMAL LOW (ref >60)
GFR calc non Af Amer: >60 mL/min (ref >60)
GFR calc non Af Amer: >60 mL/min (ref >60)
Glucose, Bld: 177 mg/dL — ABNORMAL HIGH (ref 70–99)
Glucose, Bld: 219 mg/dL — ABNORMAL HIGH (ref 70–99)
Potassium: 4.8 mEq/L (ref 3.5–5.1)
Potassium: 4 mEq/L (ref 3.5–5.1)
Sodium: 138 mEq/L (ref 135–145)
Total Bilirubin: 3.2 mg/dL — ABNORMAL HIGH (ref 0.3–1.2)
Total Bilirubin: 4.3 mg/dL — ABNORMAL HIGH (ref 0.3–1.2)
Total Protein: 6.2 g/dL (ref 6.0–8.3)

## 2010-09-17 LAB — GLUCOSE, CAPILLARY
Glucose-Capillary: 171 mg/dL — ABNORMAL HIGH (ref 70–99)
Glucose-Capillary: 184 mg/dL — ABNORMAL HIGH (ref 70–99)

## 2010-09-17 LAB — CBC
HCT: 48.3 % — ABNORMAL HIGH (ref 36.0–46.0)
HCT: 54.6 % — ABNORMAL HIGH (ref 36.0–46.0)
Hemoglobin: 16.5 g/dL — ABNORMAL HIGH (ref 12.0–15.0)
Hemoglobin: 14.1 g/dL (ref 12.0–15.0)
MCH: 30.2 pg (ref 26.0–34.0)
MCH: 30.4 pg (ref 26.0–34.0)
MCHC: 34.2 g/dL (ref 30.0–36.0)
MCV: 88.5 fL (ref 78.0–100.0)
MCV: 88.8 fL (ref 78.0–100.0)
MCV: 87.9 fL (ref 78.0–100.0)
Platelets: 312 10*3/uL (ref 150–400)
Platelets: 290 10*3/uL (ref 150–400)
RBC: 6.17 MIL/uL — ABNORMAL HIGH (ref 3.87–5.11)
RBC: 4.64 MIL/uL (ref 3.87–5.11)
RDW: 13.1 % (ref 11.5–15.5)
WBC: 8.3 10*3/uL (ref 4.0–10.5)

## 2010-09-17 LAB — URINALYSIS, ROUTINE W REFLEX MICROSCOPIC
Nitrite: POSITIVE — AB
Protein, ur: 100 mg/dL — AB
Specific Gravity, Urine: 1.031 — ABNORMAL HIGH (ref 1.005–1.030)
Urobilinogen, UA: 0.2 mg/dL (ref 0.0–1.0)

## 2010-09-17 LAB — DIFFERENTIAL
Basophils Absolute: 0 10*3/uL (ref 0.0–0.1)
Basophils Absolute: 0 10*3/uL (ref 0.0–0.1)
Basophils Relative: 0 % (ref 0–1)
Basophils Relative: 0 % (ref 0–1)
Eosinophils Absolute: 0 10*3/uL (ref 0.0–0.7)
Eosinophils Absolute: 0 10*3/uL (ref 0.0–0.7)
Eosinophils Relative: 2 % (ref 0–5)
Lymphocytes Relative: 5 % — ABNORMAL LOW (ref 12–46)
Lymphocytes Relative: 6 % — ABNORMAL LOW (ref 12–46)
Monocytes Absolute: 0.9 10*3/uL (ref 0.1–1.0)
Monocytes Absolute: 0.4 10*3/uL (ref 0.1–1.0)
Monocytes Relative: 3 % (ref 3–12)
Neutro Abs: 29 10*3/uL — ABNORMAL HIGH (ref 1.7–7.7)
Neutro Abs: 5.6 10*3/uL (ref 1.7–7.7)
Neutrophils Relative %: 91 % — ABNORMAL HIGH (ref 43–77)
Neutrophils Relative %: 91 % — ABNORMAL HIGH (ref 43–77)
WBC Morphology: INCREASED
WBC Morphology: INCREASED

## 2010-09-17 LAB — MAGNESIUM: Magnesium: 1.8 mg/dL (ref 1.5–2.5)

## 2010-09-17 LAB — CK TOTAL AND CKMB (NOT AT ARMC)
CK, MB: 4.4 ng/mL — ABNORMAL HIGH (ref 0.3–4.0)
Total CK: 46 U/L (ref 7–177)

## 2010-09-17 LAB — TYPE AND SCREEN: Antibody Screen: NEGATIVE

## 2010-09-17 LAB — URINE MICROSCOPIC-ADD ON

## 2010-09-17 LAB — PROTIME-INR
INR: 1.07 (ref 0.00–1.49)
Prothrombin Time: 13.8 seconds (ref 11.6–15.2)

## 2010-09-17 LAB — TROPONIN I
Troponin I: 0.07 ng/mL — ABNORMAL HIGH (ref 0.00–0.06)
Troponin I: 0.09 ng/mL — ABNORMAL HIGH (ref 0.00–0.06)

## 2010-09-17 LAB — HEMOGLOBIN A1C: Mean Plasma Glucose: 120 mg/dL — ABNORMAL HIGH (ref <117)

## 2010-09-17 LAB — MRSA PCR SCREENING: MRSA by PCR: NEGATIVE

## 2010-09-17 LAB — LIPASE, BLOOD
Lipase: 1741 U/L — ABNORMAL HIGH (ref 11–59)
Lipase: 884 U/L — ABNORMAL HIGH (ref 11–59)

## 2010-09-26 LAB — URINE MICROSCOPIC-ADD ON

## 2010-09-26 LAB — DIFFERENTIAL
Basophils Absolute: 0.7 10*3/uL — ABNORMAL HIGH (ref 0.0–0.1)
Lymphs Abs: 1.2 10*3/uL (ref 0.7–4.0)
Monocytes Relative: 3 % (ref 3–12)
Neutro Abs: 22.2 10*3/uL — ABNORMAL HIGH (ref 1.7–7.7)

## 2010-09-26 LAB — URINE CULTURE

## 2010-09-26 LAB — URINALYSIS, ROUTINE W REFLEX MICROSCOPIC
Nitrite: POSITIVE — AB
Specific Gravity, Urine: 1.02 (ref 1.005–1.030)
pH: 6 (ref 5.0–8.0)

## 2010-09-26 LAB — CBC
HCT: 39.5 % (ref 36.0–46.0)
Hemoglobin: 13.7 g/dL (ref 12.0–15.0)
MCHC: 34.6 g/dL (ref 30.0–36.0)
RDW: 13.9 % (ref 11.5–15.5)

## 2010-09-26 LAB — BASIC METABOLIC PANEL
Calcium: 8.6 mg/dL (ref 8.4–10.5)
GFR calc Af Amer: >60 mL/min (ref >60)
GFR calc non Af Amer: >60 mL/min (ref >60)
Glucose, Bld: 191 mg/dL — ABNORMAL HIGH (ref 70–99)
Sodium: 138 mEq/L (ref 135–145)

## 2010-09-26 LAB — GC/CHLAMYDIA PROBE AMP, GENITAL: Chlamydia, DNA Probe: NEGATIVE

## 2010-09-26 LAB — PREGNANCY, URINE: Preg Test, Ur: NEGATIVE

## 2010-09-26 LAB — WET PREP, GENITAL: Clue Cells Wet Prep HPF POC: NONE SEEN

## 2010-10-04 LAB — CBC
HCT: 33.7 % — ABNORMAL LOW (ref 36.0–46.0)
MCV: 86.6 fL (ref 78.0–100.0)
Platelets: 209 10*3/uL (ref 150–400)
RBC: 3.63 MIL/uL — ABNORMAL LOW (ref 3.87–5.11)
WBC: 10.3 10*3/uL (ref 4.0–10.5)
WBC: 9.2 10*3/uL (ref 4.0–10.5)

## 2010-10-04 LAB — RPR: RPR Ser Ql: NONREACTIVE

## 2010-10-04 LAB — GLUCOSE, RANDOM: Glucose, Bld: 74 mg/dL (ref 70–99)

## 2010-11-01 ENCOUNTER — Observation Stay (HOSPITAL_COMMUNITY)
Admission: AD | Admit: 2010-11-01 | Discharge: 2010-11-02 | DRG: 781 | Disposition: A | Discharge status: Home / Self Care | Payer: Medicaid Other | Source: Ambulatory Visit | Attending: Obstetrics and Gynecology | Admitting: Obstetrics and Gynecology

## 2010-11-01 DIAGNOSIS — E119 Type 2 diabetes mellitus without complications: Secondary | ICD-10-CM | POA: Insufficient documentation

## 2010-11-01 DIAGNOSIS — O24919 Unspecified diabetes mellitus in pregnancy, unspecified trimester: Principal | ICD-10-CM | POA: Insufficient documentation

## 2010-11-01 LAB — GLUCOSE, CAPILLARY
Glucose-Capillary: 115 mg/dL — ABNORMAL HIGH (ref 70–99)
Glucose-Capillary: 128 mg/dL — ABNORMAL HIGH (ref 70–99)
Glucose-Capillary: 172 mg/dL — ABNORMAL HIGH (ref 70–99)

## 2010-11-02 LAB — GLUCOSE, CAPILLARY
Glucose-Capillary: 94 mg/dL (ref 70–99)
Glucose-Capillary: 138 mg/dL — ABNORMAL HIGH (ref 70–99)

## 2010-11-07 NOTE — H&P (Signed)
[name], [name]              ACCOUNT NO.:  [id number]  MEDICAL RECORD NO.:  [id number]           PATIENT TYPE:  I  LOCATION:  9152                          [facility]ILITY:  [facility]  PHYSICIAN:  Huel Cote, M.D. DATE OF BIRTH:  1981-06-12  DATE OF ADMISSION:  11/01/2010 DATE OF DISCHARGE:                             HISTORY & PHYSICAL   HISTORY OF PRESENT ILLNESS:  Patient is a 30 year old G5, P 3-0-1-3 who is coming in at 20 weeks' gestation with an estimated due date of January 17, 2011 and that is by a early ultrasound performed at 8 weeks' gestation.  The patient is being admitted to the antepartum unit for diabetes control on outpatient basis.  She has been managed with gestational diabetes, diagnosed at approximately 20 weeks of gestation when she presented for prenatal care and has tried diet controlled as well as glyburide and then was changed to insulin most recently despite adjusting her insulin ranges, the patient's blood sugars have been suboptimally controlled with her reporting in the last few days postprandial 190-300 as well as fasting blood sugars, which are over 120 at times.  She is currently on a regimen of NPH insulin and these was last adjusted up to 22 units and 22 units.  However, her sugars have actually worsened over that time.  She states she is eating appropriately on the diet and does not feel that this is the issue. Other prenatal issues are a laparoscopic cholecystectomy that the patient had performed shortly after the first trimester on July 25, 2010 for significant gallbladder disease.  She also has a history of bipolar disorder which has been stable on Zyprexa, Depakote and Zoloft and for which she sees a counselor regularly.  Her prenatal labs are as follows; blood type is O+, antibody screen negative, rubella immune, RPR nonreactive, hepatitis B surface antigen negative, HIV negative, GC negative, Chlamydia negative, cystic fibrosis negative,  quad screen negative.  Her 1-hour Glucola early was 188 and her 3-hour Glucola was abnormal at 97, 237, 156 and 116.  She had a normal anatomic ultrasound on June 07, 2010 that showed normal anatomy and a female fetus.  PAST MEDICAL HISTORY:  As stated is significant for the bipolar disease, history of gestational diabetes in her other pregnancies, some gestational hypertension in the past and pancreatitis which went with her gallbladder disease prior to surgery.  PAST SURGICAL HISTORY:  Significant for the laparoscopic cholecystectomy performed in January 2012.  PAST OBSTETRICAL HISTORY:  She has had three prior vaginal deliveries; the first in 2006 of 6 pounds 5 ounces infant at 39 weeks', second in 2008 of 6 pounds 11 ounces infant and third in 2010 a 6 pounds 11 ounces infant.  She has had one therapeutic abortion in 1998.  ALLERGIES:  PENICILLIN.  She has no latex allergy.  CURRENT MEDICATIONS: 1. NPH insulin 22 units in the morning, 22 units at night. 2. Depakote for her bipolar disorder. 3. Zyprexa.4. Zoloft.  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  She is not currently smoking, she quit in the past and denies any alcohol or drug use.  PHYSICAL EXAM:  VITAL SIGNS:  The patient's weight is 174 pounds, blood pressure 110/60. CARDIAC:  Regular rate and rhythm. LUNGS:  Clear. ABDOMEN:  Gravid, nontender. CERVIX:  Deferred.  ASSESSMENT AND PLAN:  The patient was admitted for the plan to gain control of her blood sugars and possibly add short-acting Humalog to her regimen to assist with coverage of her meals.  We will also assess her diet and have her be with the diabetes educator to ensure that this is being followed appropriately.  Hopefully, this will be a short term admission for 24-48 hours and the patient will be able to be discharged home once her regimen is improved.     Huel Cote, M.D.     KR/MEDQ  D:  11/01/2010  T:  11/01/2010   Job:  [id number]  Electronically Signed by Huel Cote M.D. on 11/07/2010 09:15:38 AM
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
## 2010-11-18 NOTE — Discharge Summary (Signed)
NAMEMARGARUITE, [name]              ACCOUNT NO.:  [id number]   MEDICAL RECORD NO.:  [id number]          PATIENT TYPE:  INP   LOCATION:  9103                          [facility]ILITY[facility]  [facility]   PHYSICIAN:  Zenaida Niece, M.D.DATE OF BIRTH:  02-15-1981   DATE OF ADMISSION:  01/22/2005  DATE OF DISCHARGE:  01/24/2005                                 DISCHARGE SUMMARY   ADMISSION DIAGNOSIS:  Intrauterine pregnancy at 38+ weeks and gestational  hypertension.   DISCHARGE DIAGNOSIS:  Intrauterine pregnancy at 38+ weeks and gestational  hypertension.   PROCEDURES:  On January 22, 2005, she had a spontaneous vaginal delivery.   HISTORY AND PHYSICAL:  This is a 30 year old white female gravida 2, para 0-  0-1-0 with an EGA of 38+ weeks who presents with complaint of regular  contractions. On evaluation in triage she was 4 cm dilated with regular  contractions. Prenatal care essentially uncomplicated.   PRENATAL LABS:  Blood type is O+ with a negative antibody screen, RPR  nonreactive, rubella immune, hepatitis B surface antigen negative, HIV  negative, gonorrhea and chlamydia negative, triple screen normal, 1-hour  Glucola 151, 3-hour GTT 81, 123, 173 and 129, group B strep is negative.   PAST OB HISTORY:  One elective termination.   PAST MEDICAL HISTORY:  Of bipolar disorder.   ALLERGIES:  PENICILLIN.   PHYSICAL EXAM:  VITAL SIGNS:  She is afebrile with stable vital signs with  labile blood pressure 130-150 over 80-100. Fetal heart tracing is reactive  with contractions every 2-3 minutes.  ABDOMEN:  Gravid, nontender with an estimated fetal weight 7-1/2 pounds.  PELVIC:  Cervix; on my first exam she was 6,90, 0, vertex presentation,  adequate pelvis and amniotomy revealed clear fluid.   HOSPITAL COURSE:  The patient was admitted and continued to labor on her  own. Blood pressure stabilized. On my first exam I performed amniotomy and  she then received an epidural. She progressed to complete,  pushed well, and  on the afternoon of July 23 had a vaginal delivery of a viable female infant  with Apgars of 9 and 9 with weight 6 pounds 5 ounces. Placenta delivered  spontaneous and was intact. She had a small first-degree laceration on her  right repaired with 3-0 Vicryl for hemostasis and several small abrasions  which were hemostatic. Estimated blood loss was less than 500 mL. Postpartum  she had no significant complications. She breast fed without problems. Pre  delivery hemoglobin was 13.3, postdelivery 10.6. On postpartum day #2 she  was felt to be stable enough for discharge home.   DISCHARGE INSTRUCTIONS:  Regular diet, pelvic rest, follow-up in 6 weeks.   DISCHARGE MEDICATIONS:  1.  Percocet #20 one to two p.o. q.4-6 hours p.r.n. pain  2.  Over-the-counter ibuprofen as needed.   She is also given our discharge pamphlet.       TDM/MEDQ  D:  01/24/2005  T:  01/24/2005  Job:  [id number]

## 2010-12-01 ENCOUNTER — Other Ambulatory Visit (HOSPITAL_COMMUNITY): Payer: Self-pay | Admitting: Obstetrics and Gynecology

## 2010-12-01 ENCOUNTER — Ambulatory Visit (HOSPITAL_COMMUNITY)
Admission: RE | Admit: 2010-12-01 | Discharge: 2010-12-01 | Disposition: A | Discharge status: Home / Self Care | Payer: Medicaid Other | Source: Ambulatory Visit | Attending: Obstetrics and Gynecology | Admitting: Obstetrics and Gynecology

## 2010-12-01 DIAGNOSIS — O288 Other abnormal findings on antenatal screening of mother: Secondary | ICD-10-CM

## 2010-12-01 DIAGNOSIS — O368190 Decreased fetal movements, unspecified trimester, not applicable or unspecified: Secondary | ICD-10-CM | POA: Insufficient documentation

## 2010-12-01 DIAGNOSIS — Z3689 Encounter for other specified antenatal screening: Secondary | ICD-10-CM | POA: Insufficient documentation

## 2010-12-09 NOTE — Discharge Summary (Signed)
  NAMEJI, FAIRBURN              ACCOUNT NO.:  [id number]  MEDICAL RECORD NO.:  [id number]           PATIENT TYPE:  O  LOCATION:  9155                          [facility]  PHYSICIAN:  Huel Cote, M.D. DATE OF BIRTH:  03/31/1981  DATE OF ADMISSION:  11/01/2010 DATE OF DISCHARGE:  11/02/2010                              DISCHARGE SUMMARY   DISCHARGE DIAGNOSES: 1. Preterm pregnancy at 71 weeks' gestation. 2. Uncontrolled gestational diabetes.  DISCHARGE MEDICATIONS: 1. NPH insulin 22 units q.a.m. and q.p.m. 2. NovoLog insulin sliding scale coverage for her meals. 3. Prenatal vitamins.  DISCHARGE FOLLOWUP:  The patient is to follow up in the office in 1 week for review of her blood sugars.  HOSPITAL COURSE:  The patient is a 30 year old G5 P3-0-1-3 who came in at 51 weeks' gestation as an admission to gain control of her blood sugars which had become substantially worse over the week prior to admission.  The patient was initially diagnosed with gestational diabetes at 50 weeks' gestation when she presented for prenatal care and tried diet controlled with glyburide with no success.  She was then changed to insulin and despite increasing her insulin consistently her blood sugars have remained 190-300 in the postprandial range and her fastings were well over 120.  We decided to admit her to see if we could get a good control of her sugars and perhaps add sliding scale coverage to her meals.  She was admitted around lunch time on Nov 01, 2010, and was placed on a diabetic diet.  She was kept in-house for 24 hours and her blood sugars actually were fairly normal.  Her post breakfast 115 post lunch 126 and post dinner 176.  Her fasting blood sugar was 96 on the morning after admission.  We discussed that the patient had a relatively good control of her blood sugars.  She was eating appropriately and she met with the diabetes educator and reviewed how to purchase foods more  appropriately.  We also discussed sliding scale coverage for her carb counting so that if she did eat a meal higher in carb she could account for this with her Humalog after the patient had been educated appropriately she was felt stable for discharge home on her regimen and was to follow up in the office in 1 week.     Huel Cote, M.D.     KR/MEDQ  D:  11/23/2010  T:  11/24/2010  Job:  [id number]  Electronically Signed by Huel Cote M.D. on 12/09/2010 10:58:33 PM

## 2010-12-24 ENCOUNTER — Other Ambulatory Visit: Payer: Self-pay | Admitting: Obstetrics and Gynecology

## 2010-12-24 ENCOUNTER — Inpatient Hospital Stay (HOSPITAL_COMMUNITY)
Admission: AD | Admit: 2010-12-24 | Discharge: 2010-12-26 | DRG: 775 | Disposition: A | Discharge status: Home / Self Care | Payer: Medicaid Other | Source: Ambulatory Visit | Attending: Obstetrics and Gynecology | Admitting: Obstetrics and Gynecology

## 2010-12-24 DIAGNOSIS — O99814 Abnormal glucose complicating childbirth: Principal | ICD-10-CM | POA: Diagnosis present

## 2010-12-24 DIAGNOSIS — O99344 Other mental disorders complicating childbirth: Secondary | ICD-10-CM | POA: Diagnosis present

## 2010-12-24 DIAGNOSIS — F319 Bipolar disorder, unspecified: Secondary | ICD-10-CM | POA: Diagnosis present

## 2010-12-24 LAB — CBC
MCHC: 33.7 g/dL (ref 30.0–36.0)
Platelets: 199 10*3/uL (ref 150–400)
RDW: 15.6 % — ABNORMAL HIGH (ref 11.5–15.5)

## 2010-12-24 LAB — GLUCOSE, CAPILLARY
Glucose-Capillary: 177 mg/dL — ABNORMAL HIGH (ref 70–99)
Glucose-Capillary: 173 mg/dL — ABNORMAL HIGH (ref 70–99)

## 2010-12-25 LAB — GLUCOSE, CAPILLARY: Glucose-Capillary: 118 mg/dL — ABNORMAL HIGH (ref 70–99)

## 2010-12-25 LAB — CBC
HCT: 29.4 % — ABNORMAL LOW (ref 36.0–46.0)
MCV: 86.5 fL (ref 78.0–100.0)
RBC: 3.4 MIL/uL — ABNORMAL LOW (ref 3.87–5.11)
WBC: 10.3 10*3/uL (ref 4.0–10.5)

## 2010-12-28 NOTE — Discharge Summary (Signed)
[name], PEVEHOUSE              ACCOUNT NO.:  [id number]  MEDICAL RECORD NO.:  [id number]  LOCATION:  9108                          [facility]ILITY[facility]  PHYSICIAN:  Malachi Pro. Ambrose Mantle, M.D. DATE OF BIRTH:  05/05/1981  DATE OF ADMISSION:  12/24/2010 DATE OF DISCHARGE:  12/26/2010                              DISCHARGE SUMMARY   A 30 year old white female, para 3-0-1-3, gravida 5, [facility] January 17, 2011 admitted at 9 cm dilatation.  Blood group and type O+, negative antibody.  Pap smear normal, rubella immune, RPR nonreactive.  Urine culture and sensitivity negative, hepatitis B surface antigen negative, HIV negative, GC and Chlamydia negative, cystic fibrosis negative, quad screen negative, 1-hour Glucola 188, 3-hour GTT 97, 237, 158, and 116. Group B strep was negative.  Vaginal ultrasound on June 07, 2010, estimated gestational age [redacted] weeks 0 days, [facility] January 17, 2011.  Ultrasound on September 12, 2010, estimated gestational age [redacted] weeks 3 days, [facility] January 13, 2011.  The patient was begun on glyburide 2.5 mg twice a day on September 12, 2010.  It was increased to 5 mg twice a day on September 27, 2010. Because of poor control, she was begun on NPH insulin 10 units twice daily on April 17 and then on October 18, 2010, it was increased to 16 units in the morning, 18 units in the evening.  On October 27, 2010, NPH was increased to 22 units in the mornings, 22 units at night.  She began a sliding scale of Humalog on Nov 16, 2010.  Nonstress test were reactive by December 15, 2010 with NPH at 26 units in the morning and evening.  Blood sugars were better controlled.  She stopped her Humalog. The patient began contracting at 2:00 a.m. on the day of admission.  She came here and was 8-9 cm.  She has allergy to PENICILLIN.  She had cholecystectomy in 2012.  She has bipolar illness and cholecystitis, gestational diabetes, gestational high blood pressure, and pancreatitis.  MEDICATIONS:  Ambien 10 mg at night.  She also  takes Depakote, Zyprexa, and Zoloft.  FAMILY HISTORY:  Father with bipolar, mother with depression and diabetes.  Maternal grandmother with CVA.  Paternal grandmother with bipolar.  Alcohol, tobacco, and drugs:  None.  OBSTETRICAL HISTORY:  In January 1998, a TAB.  July 2006, 6 pounds 5 ounces female vaginally with epidural.  May 2008, a 6 pound 11 ounce female vaginally with an epidural.  December 2010, 6 pounds 11 ounces female vaginally with an epidural.  PHYSICAL EXAMINATION:  On admission, her temperature, pulse, blood pressure, and respirations were normal.  Heart and lungs normal.  The abdomen was soft.  Fundal height 36 cm on December 15, 2010.  Fetal heart tones were normal.  Cervix 9 cm, 100% vertex at -4.  The patient received an epidural and then had a gush of bloody fluid.  Artificial rupture of membranes produced clear fluid.  There was a suggestion of a partial separation.  The patient pushed with 2 contractions, deliver OA over an intact perineum by Dr. Ambrose Mantle, a living female infant 6 pounds 14 ounces with Apgar's of 7 at 1, 8 at 5, and 10  at 6 minutes.  At 1 minute, 1 was off the tone, 2 off the color at 5 minutes, 1 off the tone and color at 6 minutes.  The Apgar was 10.  Placenta was intact.  There was a probable marginal blood clot.  Uterus was normal.  Blood loss about 400 mL.  Postpartum, the patient did quite well.  Her 1 hour blood sugars were continued since her blood sugars during pregnancy were at times poorly controlled and the 1-hour blood sugars, 4 of them were 149, 114, 144, and 173.  On the second postpartum day, the patient was afebrile.  Blood pressure was normal and she was ready for discharge. Initial hemoglobin 11.6, hematocrit 34.4, white count 15,800, platelet count 199,000.  Followup hematocrit 29.4, RPR was nonreactive.  FINAL DIAGNOSIS:  Intrauterine pregnancy 36 weeks and 4 days, delivered OA, gestational diabetes mellitus on insulin, bipolar  disorder, probable marginal separation of the placenta.  FINAL CONDITION:  Improved.  OPERATIONS:  Spontaneous delivery OA.  The patient is given a discharge instruction booklet.  She declines medication for pain.  She was seen in consultation by the certified social worker and no problems were found. She does have a appointment in August for the [facility].  The patient is advised to return to the office in 6 weeks for followup examination.     Malachi Pro. Ambrose Mantle, M.D.     TFH/MEDQ  D:  12/26/2010  T:  12/26/2010  Job:  [id number]  Electronically Signed by Tracey Harries M.D. on 12/28/2010 12:03:07 PM

## 2011-06-23 ENCOUNTER — Inpatient Hospital Stay (HOSPITAL_COMMUNITY)
Admission: EM | Admit: 2011-06-23 | Discharge: 2011-06-26 | DRG: 204 | Disposition: A | Discharge status: Home / Self Care | Payer: BC Managed Care – PPO | Attending: Internal Medicine | Admitting: Internal Medicine

## 2011-06-23 ENCOUNTER — Inpatient Hospital Stay (HOSPITAL_COMMUNITY): Payer: BC Managed Care – PPO

## 2011-06-23 ENCOUNTER — Encounter: Payer: Self-pay | Admitting: *Deleted

## 2011-06-23 DIAGNOSIS — K859 Acute pancreatitis without necrosis or infection, unspecified: Secondary | ICD-10-CM | POA: Diagnosis present

## 2011-06-23 DIAGNOSIS — K863 Pseudocyst of pancreas: Principal | ICD-10-CM | POA: Diagnosis present

## 2011-06-23 DIAGNOSIS — Z880 Allergy status to penicillin: Secondary | ICD-10-CM

## 2011-06-23 DIAGNOSIS — F319 Bipolar disorder, unspecified: Secondary | ICD-10-CM | POA: Diagnosis present

## 2011-06-23 DIAGNOSIS — D72829 Elevated white blood cell count, unspecified: Secondary | ICD-10-CM | POA: Diagnosis present

## 2011-06-23 DIAGNOSIS — F17200 Nicotine dependence, unspecified, uncomplicated: Secondary | ICD-10-CM | POA: Diagnosis present

## 2011-06-23 DIAGNOSIS — K862 Cyst of pancreas: Principal | ICD-10-CM | POA: Diagnosis present

## 2011-06-23 DIAGNOSIS — R509 Fever, unspecified: Secondary | ICD-10-CM | POA: Diagnosis present

## 2011-06-23 HISTORY — DX: Bipolar disorder, unspecified: F31.9

## 2011-06-23 LAB — CBC
HCT: 38.7 % (ref 36.0–46.0)
Hemoglobin: 13.5 g/dL (ref 12.0–15.0)
MCH: 30.2 pg (ref 26.0–34.0)
MCHC: 34.9 g/dL (ref 30.0–36.0)
RDW: 13.2 % (ref 11.5–15.5)

## 2011-06-23 LAB — DIFFERENTIAL
Basophils Absolute: 0 10*3/uL (ref 0.0–0.1)
Basophils Relative: 0 % (ref 0–1)
Eosinophils Absolute: 0.1 10*3/uL (ref 0.0–0.7)
Eosinophils Relative: 0 % (ref 0–5)
Monocytes Absolute: 1.3 10*3/uL — ABNORMAL HIGH (ref 0.1–1.0)
Monocytes Relative: 8 % (ref 3–12)

## 2011-06-23 LAB — COMPREHENSIVE METABOLIC PANEL
ALT: 41 U/L — ABNORMAL HIGH (ref 0–35)
AST: 27 U/L (ref 0–37)
AST: 29 U/L (ref 0–37)
Albumin: 3.9 g/dL (ref 3.5–5.2)
Albumin: 3.5 g/dL (ref 3.5–5.2)
Alkaline Phosphatase: 73 U/L (ref 39–117)
BUN: 6 mg/dL (ref 6–23)
Calcium: 9.4 mg/dL (ref 8.4–10.5)
Chloride: 99 mEq/L (ref 96–112)
Creatinine, Ser: 0.73 mg/dL (ref 0.50–1.10)
Glucose, Bld: 126 mg/dL — ABNORMAL HIGH (ref 70–99)
Potassium: 3.3 mEq/L — ABNORMAL LOW (ref 3.5–5.1)
Sodium: 134 mEq/L — ABNORMAL LOW (ref 135–145)
Total Bilirubin: 0.8 mg/dL (ref 0.3–1.2)
Total Protein: 8 g/dL (ref 6.0–8.3)
Total Protein: 7.3 g/dL (ref 6.0–8.3)

## 2011-06-23 LAB — URINALYSIS, ROUTINE W REFLEX MICROSCOPIC
Glucose, UA: NEGATIVE mg/dL
Ketones, ur: 15 mg/dL — AB
Protein, ur: 100 mg/dL — AB
pH: 6 (ref 5.0–8.0)

## 2011-06-23 LAB — URINE MICROSCOPIC-ADD ON

## 2011-06-23 LAB — PREGNANCY, URINE: Preg Test, Ur: NEGATIVE

## 2011-06-23 LAB — LIPASE, BLOOD: Lipase: 462 U/L — ABNORMAL HIGH (ref 11–59)

## 2011-06-23 MED ORDER — IOHEXOL 300 MG/ML  SOLN
100.0000 mL | Freq: Once | INTRAMUSCULAR | Status: AC | PRN
  Administered 2011-06-23: 100 mL via INTRAVENOUS

## 2011-06-23 MED ORDER — ONDANSETRON HCL 4 MG PO TABS: 4.0000 mg | ORAL_TABLET | Freq: Four times a day (QID) | ORAL | Status: DC | PRN

## 2011-06-23 MED ORDER — ONDANSETRON HCL 4 MG/2ML IJ SOLN: 4.0000 mg | Freq: Four times a day (QID) | INTRAMUSCULAR | Status: DC | PRN

## 2011-06-23 MED ORDER — SODIUM CHLORIDE 0.9 % IV SOLN
INTRAVENOUS | Status: AC
  Administered 2011-06-23: 15:00:00 via INTRAVENOUS

## 2011-06-23 MED ORDER — DIVALPROEX SODIUM ER 500 MG PO TB24
500.0000 mg | ORAL_TABLET | Freq: Every day | ORAL | Status: DC
  Administered 2011-06-23 – 2011-06-25 (×3): 500 mg via ORAL
  Filled 2011-06-23 (×4): qty 1

## 2011-06-23 MED ORDER — SODIUM CHLORIDE 0.9 % IV BOLUS (SEPSIS)
1000.0000 mL | Freq: Once | INTRAVENOUS | Status: AC
  Administered 2011-06-23: 1000 mL via INTRAVENOUS

## 2011-06-23 MED ORDER — ONDANSETRON HCL 4 MG/2ML IJ SOLN
4.0000 mg | Freq: Three times a day (TID) | INTRAMUSCULAR | Status: DC | PRN
Start: 2011-06-23 — End: 2011-06-23

## 2011-06-23 MED ORDER — HYDROMORPHONE HCL PF 1 MG/ML IJ SOLN
2.0000 mg | INTRAMUSCULAR | Status: DC | PRN
  Administered 2011-06-23 – 2011-06-26 (×14): 2 mg via INTRAVENOUS
  Filled 2011-06-23: qty 1
  Filled 2011-06-23 (×5): qty 2
  Filled 2011-06-23: qty 1
  Filled 2011-06-23 (×6): qty 2
  Filled 2011-06-23 (×2): qty 1
  Filled 2011-06-23: qty 2
  Filled 2011-06-23 (×2): qty 1

## 2011-06-23 MED ORDER — POLYETHYLENE GLYCOL 3350 17 G PO PACK
17.0000 g | PACK | Freq: Every day | ORAL | Status: DC | PRN
  Filled 2011-06-23: qty 1

## 2011-06-23 MED ORDER — SODIUM CHLORIDE 0.9 % IV SOLN: Freq: Once | INTRAVENOUS | Status: DC

## 2011-06-23 MED ORDER — OLANZAPINE 10 MG PO TABS
10.0000 mg | ORAL_TABLET | Freq: Every day | ORAL | Status: DC
  Administered 2011-06-23 – 2011-06-25 (×3): 10 mg via ORAL
  Filled 2011-06-23 (×4): qty 1

## 2011-06-23 MED ORDER — ZOLPIDEM TARTRATE 5 MG PO TABS: 10.0000 mg | ORAL_TABLET | Freq: Every evening | ORAL | Status: DC | PRN

## 2011-06-23 MED ORDER — PANTOPRAZOLE SODIUM 40 MG IV SOLR
40.0000 mg | Freq: Every day | INTRAVENOUS | Status: DC
  Administered 2011-06-23 – 2011-06-25 (×3): 40 mg via INTRAVENOUS
  Filled 2011-06-23 (×4): qty 40

## 2011-06-23 MED ORDER — LAMOTRIGINE 100 MG PO TABS
100.0000 mg | ORAL_TABLET | Freq: Every day | ORAL | Status: DC
  Administered 2011-06-23 – 2011-06-25 (×3): 100 mg via ORAL
  Filled 2011-06-23 (×4): qty 1

## 2011-06-23 MED ORDER — HYDROMORPHONE HCL PF 1 MG/ML IJ SOLN: 1.0000 mg | INTRAMUSCULAR | Status: DC | PRN

## 2011-06-23 MED ORDER — SODIUM CHLORIDE 0.9 % IV SOLN
INTRAVENOUS | Status: DC
  Administered 2011-06-23 – 2011-06-26 (×6): via INTRAVENOUS

## 2011-06-23 MED ORDER — ONDANSETRON HCL 4 MG/2ML IJ SOLN
4.0000 mg | Freq: Once | INTRAMUSCULAR | Status: AC
  Administered 2011-06-23: 4 mg via INTRAVENOUS
  Filled 2011-06-23: qty 2

## 2011-06-23 MED ORDER — HYDROMORPHONE HCL PF 1 MG/ML IJ SOLN
1.0000 mg | Freq: Once | INTRAMUSCULAR | Status: AC
  Administered 2011-06-23: 1 mg via INTRAVENOUS
  Filled 2011-06-23: qty 1

## 2011-06-23 NOTE — ED Notes (Signed)
Patient states that she has had abdominal pain and nausea and low grade fever  x 3 days pain located mostly on right side. Patient denies vomiting. Patient denies chest pain.

## 2011-06-23 NOTE — ED Notes (Signed)
4501-01 READY

## 2011-06-23 NOTE — ED Notes (Signed)
Patient resting with NAD at this time.  

## 2011-06-23 NOTE — ED Notes (Signed)
Patient given contrast

## 2011-06-23 NOTE — ED Notes (Signed)
Patient resting and drinking contrast for CT. NAD at this time.

## 2011-06-23 NOTE — ED Notes (Addendum)
Patient states pain in the abdomen, especially on the left side and nausea with low grade fever x 3 days. Patient denies vomiting. Patient denies eating anything different that has caused her any abdominal problems. Last BM yesterday afternoon.

## 2011-06-23 NOTE — ED Notes (Signed)
Report given to Tammy, RN on 4500. Patient being admitted to 4501-02.

## 2011-06-23 NOTE — H&P (Addendum)
Hospital Admission Note Date: 06/23/2011  PCP: No primary provider on file.  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 We'll have her baby about 6 months ago. He came into the emergency room for abdominal pain the prior to admission. She relates this all started the night prior to admission she took some Dilaudid this helped. The pain progressively got worse. To the floor she cannot take anything by mouth because it hurts her. She relates that intake of food or water reason pills makes her abdominal pain worse the pain medication makes it better. She relates no chills. She had a temperature here in the hospital one 101.3  She relates she last bowel movement was the day prior to admission, he had some nausea but no vomiting. No diarrhea. She's currently having her period. The lateral discharge.  Allergies: Penicillins Past Medical History  Diagnosis Date  . Pancreatitis   . Bipolar 1 disorder    Prior to Admission medications   Medication Sig Start Date End Date Taking? Authorizing Provider  divalproex (DEPAKOTE ER) 500 MG 24 hr tablet Take 500 mg by mouth at bedtime.     Yes Historical Provider, MD  lamoTRIgine (LAMICTAL) 100 MG tablet Take 100 mg by mouth at bedtime.     Yes Historical Provider, MD  OLANZapine (ZYPREXA) 10 MG tablet Take 10 mg by mouth at bedtime.     Yes Historical Provider, MD   Past Surgical History  Procedure Date  . Cholecystectomy    History reviewed. No pertinent family history. History   Social History  . Marital Status: Married    Spouse Name: N/A    Number of Children: N/A  . Years of Education: N/A   Occupational History  . Not on file.   Social History Main Topics  . Smoking status: Current Everyday Smoker -- 0.2 packs/day    Types: Cigarettes  . Smokeless tobacco: Not on file  . Alcohol Use: No  .  Drug Use: No  . Sexually Active: Yes    Birth Control/ Protection: Implant   Other Topics Concern  . Not on file   Social History Narrative  . No narrative on file   Review of Systems: Pertinent items are noted in HPI. Physical Exam: Filed Vitals:   06/23/11 1500 06/23/11 1515 06/23/11 1533 06/23/11 1605  BP: 113/67 119/67 118/68 115/72  Pulse:   114 102  Temp:   99.4 F (37.4 C) 101.1 F (38.4 C)  TempSrc:   Oral Oral  Resp: 26 14 20 20   Height:      Weight:      SpO2:   99% 93%   No intake or output data in the 24 hours ending 06/23/11 1615 BP 115/72  Pulse 102  Temp(Src) 101.1 F (38.4 C) (Oral)  Resp 20  Ht 5\' 2"  (1.575 m)  Wt 76.204 kg (168 lb)  BMI 30.73 kg/m2  SpO2 93%  LMP 06/23/2011  General Appearance:    Alert, cooperative, no distress, appears stated age, obese  Head:    Normocephalic, without obvious abnormality, atraumatic  Eyes:    PERRL, conjunctiva/corneas clear, EOM's intact, fundi    benign, both eyes  Ears:    Normal TM's and external ear canals, both ears  Nose:   Nares normal, septum midline, mucosa normal, no drainage    or sinus tenderness  Throat:   Lips, mucosa, and tongue  normal; teeth and gums normal  Neck:   Supple, symmetrical, trachea midline, no adenopathy;    thyroid:  no enlargement/tenderness/nodules; no carotid   bruit or JVD  Back:     Symmetric, no curvature, ROM normal, no CVA tenderness  Lungs:     Clear to auscultation bilaterally, respirations unlabored  Chest Wall:    No tenderness or deformity   Heart:    Regular rate and rhythm, S1 and S2 normal, no murmur, rub   or gallop  Breast Exam:    No tenderness, masses, or nipple abnormality  Abdomen:     Soft, non-tender, bowel sounds active all four quadrants,    no masses, no organomegaly  Genitalia:  defered  Rectal:  defered  Extremities:   Extremities normal, atraumatic, no cyanosis or edema  Pulses:   2+ and symmetric all extremities  Skin:   Skin color, texture,  turgor normal, no rashes or lesions  Lymph nodes:   Cervical, supraclavicular, and axillary nodes normal  Neurologic:   CNII-XII intact, normal strength, sensation and reflexes    throughout   Lab results:  Basename 06/23/11 1300  NA 136  K 3.5  CL 99  CO2 24  GLUCOSE 169*  BUN 6  CREATININE 0.73  CALCIUM 9.4  MG --  PHOS --    Basename 06/23/11 1300  AST 27  ALT 38*  ALKPHOS 81  BILITOT 0.8  PROT 8.0  ALBUMIN 3.9    Basename 06/23/11 1300  LIPASE 462*  AMYLASE --    Basename 06/23/11 1300  WBC 17.2*  NEUTROABS 13.8*  HGB 13.5  HCT 38.7  MCV 86.6  PLT 196    Imaging results:  No results found. Other results: EKG: none.   Patient Active Hospital Problem List: -Abdominal pain (06/23/2011)  30 year old with past medical history of pancreatitis with a lipase of 462 the most likely cause acute pancreatitis. We'll get a CT scan of the abdomen with contrast to rule out pseudocyst. She previously had a CT scan that showed a pseudocyst.  At this time we'll place the patient n.p.o. will continue IV narcotics Zofran for nausea. She had a mild temperature here that was 101.3 question if necrotizing prostatitis so we'll have to follow up on CT scan. IV at 125 cc/hrs  Urine pregnancy test negative. -Fever (06/23/2011)  Temperature here in the hospital 101.1 we'll get a chest x-ray blood cultures and urine cultures. At this time I will not start her on antibiotics as we have not identified a source. She does have a white count of 17.2, this could be stress demargination. Check a CBC in am.  -Leukocytosis: To be stress demargination. She does have a fever. We'll get a chest x-ray, blood cultures x2 and CT scan of the abdomen and pelvis to evaluate the pancreas for any kind of necrosis. U/a no signs of infection. Marinda Elk M.D. [facility] [phone] 06/23/2011, 4:15 PM

## 2011-06-23 NOTE — ED Provider Notes (Signed)
History     CSN: [id number]  Arrival date & time 06/23/11  1230   First MD Initiated Contact with Patient 06/23/11 1241      No chief complaint on file.   (Consider location/radiation/quality/duration/timing/severity/associated sxs/prior treatment) Patient is a 30 y.o. female presenting with abdominal pain. The history is provided by the patient.  Abdominal Pain The primary symptoms of the illness include abdominal pain and nausea. The primary symptoms of the illness do not include fever, fatigue, shortness of breath, vomiting, diarrhea, dysuria, vaginal discharge or vaginal bleeding. The current episode started 2 days ago. The onset of the illness was gradual. The problem has been gradually worsening.  The patient states that she believes she is currently not pregnant. Additional symptoms associated with the illness include chills and anorexia. Symptoms associated with the illness do not include diaphoresis, constipation, urgency, hematuria, frequency or back pain.  Pt states pain is mainly in the left abdomen. States it is constant. Sharp. Went to [facility] medicine clinic, was told to come here for imaging. Hx of necrotic pancreatitis. Denies fever, vomiting, CP, SOB, diarrhea.  Past Medical History  Diagnosis Date  . Pancreatitis   . Bipolar 1 disorder     Past Surgical History  Procedure Date  . Cholecystectomy     No family history on file.  History  Substance Use Topics  . Smoking status: Current Everyday Smoker  . Smokeless tobacco: Not on file  . Alcohol Use: No    OB History    Grav Para Term Preterm Abortions TAB SAB Ect Mult Living                  Review of Systems  Constitutional: Positive for chills. Negative for fever, diaphoresis and fatigue.  HENT: Negative.   Eyes: Negative.   Respiratory: Negative.  Negative for shortness of breath.   Cardiovascular: Negative.   Gastrointestinal: Positive for nausea, abdominal pain and anorexia. Negative for  vomiting, diarrhea and constipation.  Genitourinary: Negative for dysuria, urgency, frequency, hematuria, vaginal bleeding and vaginal discharge.  Musculoskeletal: Negative.  Negative for back pain.  Skin: Negative.   Neurological: Negative.   Psychiatric/Behavioral: Negative.     Allergies  Penicillins  Home Medications   Current Outpatient Rx  Name Route Sig Dispense Refill  . DIVALPROEX SODIUM ER 500 MG PO TB24 Oral Take 500 mg by mouth at bedtime.      Marland Kitchen LAMOTRIGINE 100 MG PO TABS Oral Take 100 mg by mouth at bedtime.      Marland Kitchen OLANZAPINE 10 MG PO TABS Oral Take 10 mg by mouth at bedtime.        BP 93/44  Pulse 132  Temp(Src) 97.5 F (36.4 C) (Oral)  Resp 20  Ht 5\' 2"  (1.575 m)  Wt 168 lb (76.204 kg)  BMI 30.73 kg/m2  SpO2 96%  Physical Exam  Nursing note and vitals reviewed. Constitutional: She is oriented to person, place, and time. She appears well-developed and well-nourished.       Uncomfortable appearing  Eyes: Conjunctivae are normal.  Cardiovascular: Regular rhythm, normal heart sounds and normal pulses.  Tachycardia present.   Pulmonary/Chest: Effort normal and breath sounds normal. No respiratory distress.  Abdominal: Soft. Bowel sounds are normal. She exhibits no distension. There is tenderness in the left upper quadrant and left lower quadrant. There is guarding. There is no rigidity and no rebound.  Musculoskeletal: Normal range of motion. She exhibits no edema.  Neurological: She is alert and oriented  to person, place, and time.  Skin: Skin is warm and dry. No rash noted.  Psychiatric: She has a normal mood and affect.    ED Course  Procedures (including critical care time)  Pt with hx of pancreatitis due to gallstones and common bile duct stones. Cholecystectomy 6 mon ago. Pain similar to that. Will get labs and will monitor.  Results for orders placed during the hospital encounter of 06/23/11  PREGNANCY, URINE      Component Value Range   Preg  Test, Ur NEGATIVE    URINALYSIS, ROUTINE W REFLEX MICROSCOPIC      Component Value Range   Color, Urine AMBER (*) YELLOW    APPearance CLOUDY (*) CLEAR    Specific Gravity, Urine 1.028  1.005 - 1.030    pH 6.0  5.0 - 8.0    Glucose, UA NEGATIVE  NEGATIVE (mg/dL)   Hgb urine dipstick LARGE (*) NEGATIVE    Bilirubin Urine SMALL (*) NEGATIVE    Ketones, ur 15 (*) NEGATIVE (mg/dL)   Protein, ur 478 (*) NEGATIVE (mg/dL)   Urobilinogen, UA 1.0  0.0 - 1.0 (mg/dL)   Nitrite NEGATIVE  NEGATIVE    Leukocytes, UA SMALL (*) NEGATIVE   CBC      Component Value Range   WBC 17.2 (*) 4.0 - 10.5 (K/uL)   RBC 4.47  3.87 - 5.11 (MIL/uL)   Hemoglobin 13.5  12.0 - 15.0 (g/dL)   HCT 29.5  62.1 - 30.8 (%)   MCV 86.6  78.0 - 100.0 (fL)   MCH 30.2  26.0 - 34.0 (pg)   MCHC 34.9  30.0 - 36.0 (g/dL)   RDW 65.7  84.6 - 96.2 (%)   Platelets 196  150 - 400 (K/uL)  DIFFERENTIAL      Component Value Range   Neutrophils Relative 80 (*) 43 - 77 (%)   Neutro Abs 13.8 (*) 1.7 - 7.7 (K/uL)   Lymphocytes Relative 12  12 - 46 (%)   Lymphs Abs 2.0  0.7 - 4.0 (K/uL)   Monocytes Relative 8  3 - 12 (%)   Monocytes Absolute 1.3 (*) 0.1 - 1.0 (K/uL)   Eosinophils Relative 0  0 - 5 (%)   Eosinophils Absolute 0.1  0.0 - 0.7 (K/uL)   Basophils Relative 0  0 - 1 (%)   Basophils Absolute 0.0  0.0 - 0.1 (K/uL)  COMPREHENSIVE METABOLIC PANEL      Component Value Range   Sodium 136  135 - 145 (mEq/L)   Potassium 3.5  3.5 - 5.1 (mEq/L)   Chloride 99  96 - 112 (mEq/L)   CO2 24  19 - 32 (mEq/L)   Glucose, Bld 169 (*) 70 - 99 (mg/dL)   BUN 6  6 - 23 (mg/dL)   Creatinine, Ser 9.52  0.50 - 1.10 (mg/dL)   Calcium 9.4  8.4 - 84.1 (mg/dL)   Total Protein 8.0  6.0 - 8.3 (g/dL)   Albumin 3.9  3.5 - 5.2 (g/dL)   AST 27  0 - 37 (U/L)   ALT 38 (*) 0 - 35 (U/L)   Alkaline Phosphatase 81  39 - 117 (U/L)   Total Bilirubin 0.8  0.3 - 1.2 (mg/dL)   GFR calc non Af Amer >90  >90 (mL/min)   GFR calc Af Amer >90  >90 (mL/min)  LIPASE,  BLOOD      Component Value Range   Lipase 462 (*) 11 - 59 (U/L)  URINE MICROSCOPIC-ADD ON  Component Value Range   Squamous Epithelial / LPF MANY (*) RARE    WBC, UA 3-6  <3 (WBC/hpf)   RBC / HPF 3-6  <3 (RBC/hpf)   Bacteria, UA FEW (*) RARE    No results found.  WBC elevated, lipase 462. Suspect pancreatitis. Fluids currently running. Pain under control with IV dilaudid and zofran. Will get CT abdomen for further evaluation of cause. VS improving, HR down to 109, BO 106/60. Will continue to monitor and admit.  2:34 PM Spoke with [facility], will admit.   MDM          Lottie Mussel, PA 06/23/11 1435

## 2011-06-24 ENCOUNTER — Inpatient Hospital Stay (HOSPITAL_COMMUNITY): Payer: BC Managed Care – PPO

## 2011-06-24 DIAGNOSIS — K863 Pseudocyst of pancreas: Secondary | ICD-10-CM

## 2011-06-24 DIAGNOSIS — K862 Cyst of pancreas: Secondary | ICD-10-CM

## 2011-06-24 LAB — COMPREHENSIVE METABOLIC PANEL
CO2: 23 mEq/L (ref 19–32)
Calcium: 8.7 mg/dL (ref 8.4–10.5)
Chloride: 104 mEq/L (ref 96–112)
Creatinine, Ser: 0.61 mg/dL (ref 0.50–1.10)
GFR calc Af Amer: >90 mL/min (ref >90)
GFR calc non Af Amer: >90 mL/min (ref >90)
Glucose, Bld: 101 mg/dL — ABNORMAL HIGH (ref 70–99)
Total Bilirubin: 0.8 mg/dL (ref 0.3–1.2)

## 2011-06-24 LAB — CBC
Hemoglobin: 11.2 g/dL — ABNORMAL LOW (ref 12.0–15.0)
MCHC: 33.5 g/dL (ref 30.0–36.0)
Platelets: 154 10*3/uL (ref 150–400)
RBC: 3.78 MIL/uL — ABNORMAL LOW (ref 3.87–5.11)

## 2011-06-24 MED ORDER — SODIUM CHLORIDE 0.9 % IV SOLN
500.0000 mg | Freq: Three times a day (TID) | INTRAVENOUS | Status: DC
  Administered 2011-06-24 (×2): 500 mg via INTRAVENOUS
  Filled 2011-06-24 (×4): qty 500

## 2011-06-24 MED ORDER — SODIUM CHLORIDE 0.9 % IV SOLN: 500.0000 mg | Freq: Three times a day (TID) | INTRAVENOUS | Status: DC

## 2011-06-24 NOTE — Consult Note (Signed)
I spoke at length with the patient and her husband. Questions answered. Patient examined and I agree with the assessment and plan  [name] E

## 2011-06-24 NOTE — Consult Note (Signed)
Reason for Consult:Pancreatitis with pseudocyst Referring Physician: Radonna Ricker   HPI: [name] is an 30 y.o. female who we have seen in the past. She had previously gotten post ERCP pancreatitis in 2011 during pregnancy, but she eventually go a lap chole by Dr. Janee Morn in January of 2012. 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 She has been on IV antibiotics and bowel rest. Surgery consult requested. She has had endoscopic pseudocyst aspiration in the past by GI. She states her pain is some better. She is still having intermittent fevers. States her BMs have been normal. NO current N/V this am.  Past Medical History:  Past Medical History  Diagnosis Date  . Pancreatitis   . Bipolar 1 disorder     Surgical History:  Past Surgical History  Procedure Date  . Cholecystectomy     Family History: History reviewed. No pertinent family history.  Social History:  reports that she has been smoking Cigarettes.  She has been smoking about .25 packs per day. She does not have any smokeless tobacco history on file. She reports that she does not drink alcohol or use illicit drugs.  Allergies:  Allergies  Allergen Reactions  . Penicillins Anaphylaxis    Medications: I have reviewed the patient's current medications.  ROS: See HPI for pertinent findings, otherwise complete 10 system review negative.  Physical Exam: Blood pressure 103/72, pulse 101, temperature 98.4 F (36.9 C), temperature source Oral, resp. rate 20, height 5\' 2"  (1.575 m), weight 76.204 kg (168 lb), last menstrual period 06/23/2011, SpO2 91.00%.  General Appearance:  Alert, cooperative, no distress, appears stated age  Head:  Normocephalic, without obvious abnormality, atraumatic  ENT: Unremarkable  Neck: Supple, symmetrical, trachea midline, no adenopathy, thyroid: not enlarged, symmetric, no tenderness/mass/nodules  Lungs:   Clear to auscultation bilaterally, no w/r/r, respirations unlabored without  use of accessory muscles.  Chest Wall:  No tenderness or deformity  Heart:  Regular rate and rhythm, S1, S2 normal, no murmur, rub or gallop. Carotids 2+ without bruit.  Abdomen:   Soft,mild-moderate tenderness epigastrum and L>R, non distended. Bowel sounds active all four quadrants,  no masses, no organomegaly.     Labs: CBC  Basename 06/24/11 0630 06/23/11 1300  WBC 12.8* 17.2*  HGB 11.2* 13.5  HCT 33.4* 38.7  PLT 154 196   MET  Basename 06/24/11 0630 06/23/11 1748  NA 139 134*  K 3.2* 3.3*  CL 104 101  CO2 23 21  GLUCOSE 101* 126*  BUN 5* 6  CREATININE 0.61 0.58  CALCIUM 8.7 8.4    Basename 06/24/11 0630 06/23/11 1748  PROT 7.1 --  ALBUMIN 3.0* --  AST 27 --  ALT 42* --  ALKPHOS 85 --  BILITOT 0.8 --  BILIDIR -- --  IBILI -- --  LIPASE -- 285*   PT/INR No results found for this basename: LABPROT:2,INR:2 in the last 72 hours ABG No results found for this basename: PHART:2,PCO2:2,PO2:2,HCO3:2 in the last 72 hours    Ct Abdomen Pelvis W Contrast  06/23/2011  *RADIOLOGY REPORT*  Clinical Data: Abdominal pain, nausea, fever, history of pancreatitis  CT ABDOMEN AND PELVIS WITH CONTRAST  Technique:  Multidetector CT imaging of the abdomen and pelvis was performed following the standard protocol during bolus administration of intravenous contrast.  Contrast: OMNIPAQUE IOHEXOL 300 MG/ML IV SOLN  Comparison: CT abdomen pelvis of 03/26/2010.  Findings: The lung bases are clear other than linear scarring the right lower lobe.  The liver appears diffusely  low in attenuation consistent with fatty infiltration with areas of sparing. A low attenuation area in the medial aspect of the medial segment of the left lobe of liver may be postoperative in nature with adjacent surgical clips from prior cholecystectomy noted.  The head of the pancreas is unremarkable.  However there is a large pseudocyst occupying much of the body and proximal tail of the pancreas measuring 9.3 x 6.5  cm.  In addition, there is fluid along the anterior pararenal space on the left tracking into the left pericolic gutter consistent with acute pancreatitis and pancreatic exudate.  There is some edema of the adjacent proximal descending colon as result of this local inflammatory process.  The  kidneys enhance with no calculus or mass and no hydronephrosis is seen.  The abdominal aorta is normal in caliber.  No adenopathy is seen.  The uterus is normal in size.  An IUD is present centrally.  Small ovarian follicles are present.  No free fluid is noted.  The urinary bladder is not well distended but is unremarkable.  No abnormality of the colon is seen.  No bony abnormality is seen.  IMPRESSION:  1.  Large pancreatic pseudocyst occupying much of the mid body and proximal tail of the pancreas. 2.  Strandiness along the tail of the pancreas with fluid in the left anterior pararenal space and left paracolic gutter consistent with acute pancreatitis and pancreatic exudate. 3.  Fatty infiltration of the liver with areas of sparing.  Original Report Authenticated By: Juline Patch, M.D.    Assessment/Plan: Pancreatitis with large Pseudocyst(recurrent) Agree with antibiotics and bowel rest. Recommend GI consult for possible endoscopic pseudocyst aspiration.  Marianna Fuss 06/24/2011, 2:58 PM

## 2011-06-24 NOTE — Progress Notes (Signed)
Subjective: Patient continues to have pain 6/10. Objective: Filed Vitals:   06/23/11 1900 06/24/11 0143 06/24/11 0534 06/24/11 0628  BP: 101/68 111/73 103/69 137/55  Pulse: 98 104 99 51  Temp: 100.8 F (38.2 C) 101.1 F (38.4 C) 98.2 F (36.8 C) 97.5 F (36.4 C)  TempSrc: Oral Oral Oral Oral  Resp: 20 20 20 20   Height:      Weight:      SpO2: 93% 95% 92% 97%   Weight change:  No intake or output data in the 24 hours ending 06/24/11 1334  General: Alert, awake, oriented x3, in no acute distress.  HEENT: No bruits, no goiter.  Heart: Regular rate and rhythm, without murmurs, rubs, gallops.  Lungs: Good air movement and clear to auscultation Abdomen: Soft, positive bowel sounds, tenderness in the left flank no rebound or guarding. Neuro: Grossly intact, nonfocal.   Lab Results:  [facility] 06/24/11 0630 06/23/11 1748  NA 139 134*  K 3.2* 3.3*  CL 104 101  CO2 23 21  GLUCOSE 101* 126*  BUN 5* 6  CREATININE 0.61 0.58  CALCIUM 8.7 8.4  MG -- --  PHOS -- --    Basename 06/24/11 0630 06/23/11 1748  AST 27 29  ALT 42* 41*  ALKPHOS 85 73  BILITOT 0.8 0.7  PROT 7.1 7.3  ALBUMIN 3.0* 3.5    Basename 06/23/11 1748 06/23/11 1300  LIPASE 285* 462*  AMYLASE -- --    Basename 06/24/11 0630 06/23/11 1300  WBC 12.8* 17.2*  NEUTROABS -- 13.8*  HGB 11.2* 13.5  HCT 33.4* 38.7  MCV 88.4 86.6  PLT 154 196     Micro Results: No results found for this or any previous visit (from the past 240 hour(s)).  Studies/Results: Ct Abdomen Pelvis W Contrast  06/23/2011  *RADIOLOGY REPORT*  Clinical Data: Abdominal pain, nausea, fever, history of pancreatitis  CT ABDOMEN AND PELVIS WITH CONTRAST  Technique:  Multidetector CT imaging of the abdomen and pelvis was performed following the standard protocol during bolus administration of intravenous contrast.  Contrast: OMNIPAQUE IOHEXOL 300 MG/ML IV SOLN  Comparison: CT abdomen pelvis of 03/26/2010.  Findings: The lung bases are  clear other than linear scarring the right lower lobe.  The liver appears diffusely low in attenuation consistent with fatty infiltration with areas of sparing. A low attenuation area in the medial aspect of the medial segment of the left lobe of liver may be postoperative in nature with adjacent surgical clips from prior cholecystectomy noted.  The head of the pancreas is unremarkable.  However there is a large pseudocyst occupying much of the body and proximal tail of the pancreas measuring 9.3 x 6.5 cm.  In addition, there is fluid along the anterior pararenal space on the left tracking into the left pericolic gutter consistent with acute pancreatitis and pancreatic exudate.  There is some edema of the adjacent proximal descending colon as result of this local inflammatory process.  The  kidneys enhance with no calculus or mass and no hydronephrosis is seen.  The abdominal aorta is normal in caliber.  No adenopathy is seen.  The uterus is normal in size.  An IUD is present centrally.  Small ovarian follicles are present.  No free fluid is noted.  The urinary bladder is not well distended but is unremarkable.  No abnormality of the colon is seen.  No bony abnormality is seen.  IMPRESSION:  1.  Large pancreatic pseudocyst occupying much of the mid body and  proximal tail of the pancreas. 2.  Strandiness along the tail of the pancreas with fluid in the left anterior pararenal space and left paracolic gutter consistent with acute pancreatitis and pancreatic exudate. 3.  Fatty infiltration of the liver with areas of sparing.  Original Report Authenticated By: Juline Patch, M.D.    Medications: I have reviewed the patient's current medications.   Principal Problem:  *Pancreatitis, acute Active Problems:  Fever  Leukocytosis    Assessment and plan: -Continue n.p.o. May have ice chips. Continue narcotics. Called surgery consult. Minor drain for that pseudocyst. White blood cell count down, continues to have  temperatures. We'll start imipenem.  -Blood cultures pending start imipenem.  LOS: 1 day   Marinda Elk M.D. Pager: [phone] [facility] 06/24/2011, 1:34 PM

## 2011-06-25 DIAGNOSIS — K863 Pseudocyst of pancreas: Secondary | ICD-10-CM | POA: Diagnosis present

## 2011-06-25 MED ORDER — SERTRALINE HCL 50 MG PO TABS
50.0000 mg | ORAL_TABLET | Freq: Every day | ORAL | Status: DC
  Administered 2011-06-25 – 2011-06-26 (×2): 50 mg via ORAL
  Filled 2011-06-25 (×2): qty 1

## 2011-06-25 MED ORDER — DEXTROSE 5 % IV SOLN
1.0000 g | Freq: Three times a day (TID) | INTRAVENOUS | Status: DC
  Administered 2011-06-25 – 2011-06-26 (×4): 1 g via INTRAVENOUS
  Filled 2011-06-25 (×6): qty 1

## 2011-06-25 MED ORDER — CLINDAMYCIN PHOSPHATE 600 MG/50ML IV SOLN
600.0000 mg | Freq: Three times a day (TID) | INTRAVENOUS | Status: DC
  Administered 2011-06-25 – 2011-06-26 (×4): 600 mg via INTRAVENOUS
  Filled 2011-06-25 (×6): qty 50

## 2011-06-25 NOTE — Progress Notes (Addendum)
Late entry; Nurse called me that the patient was tear full, and said that she wanted to "ripped her childs (40 month old) head off". I have order a sitter, put her on suicide precaution and call security. Get social worker involved. Informed the father of the child and separate the child from the mother. I will start her on zoloft which she was in as an outpatient, but was not in her home medication, when she informed me or the pharmacy tech when her med. reconciliation was done. I will go ahead get a psyq. Consult.

## 2011-06-25 NOTE — Progress Notes (Signed)
Subjective: Pain improved today. Patient will like to try something to eat Objective: Filed Vitals:   06/24/11 0628 06/24/11 1405 06/24/11 2108 06/25/11 0609  BP: 137/55 103/72 119/75 112/68  Pulse: 51 101 103 94  Temp: 97.5 F (36.4 C) 98.4 F (36.9 C) 100.7 F (38.2 C) 98.7 F (37.1 C)  TempSrc: Oral Oral Oral Oral  Resp: 20 20 20 20   Height:      Weight:      SpO2: 97% 91% 90% 94%   Weight change:   Intake/Output Summary (Last 24 hours) at 06/25/11 0919 Last data filed at 06/25/11 0700  Gross per 24 hour  Intake   1476 ml  Output      0 ml  Net   1476 ml    General: Alert, awake, oriented x3, in no acute distress.  HEENT: No bruits, no goiter.  Heart: Regular rate and rhythm, without murmurs, rubs, gallops.  Lungs: Good air movement and clear to auscultation Abdomen: Soft, positive bowel sounds, tenderness in the left flank no rebound or guarding. Neuro: Grossly intact, nonfocal.   Lab Results:  [facility] 06/24/11 0630 06/23/11 1748  NA 139 134*  K 3.2* 3.3*  CL 104 101  CO2 23 21  GLUCOSE 101* 126*  BUN 5* 6  CREATININE 0.61 0.58  CALCIUM 8.7 8.4  MG -- --  PHOS -- --    Basename 06/24/11 0630 06/23/11 1748  AST 27 29  ALT 42* 41*  ALKPHOS 85 73  BILITOT 0.8 0.7  PROT 7.1 7.3  ALBUMIN 3.0* 3.5    Basename 06/23/11 1748 06/23/11 1300  LIPASE 285* 462*  AMYLASE -- --    Basename 06/24/11 0630 06/23/11 1300  WBC 12.8* 17.2*  NEUTROABS -- 13.8*  HGB 11.2* 13.5  HCT 33.4* 38.7  MCV 88.4 86.6  PLT 154 196     Micro Results: Recent Results (from the past 240 hour(s))  CULTURE, BLOOD (ROUTINE X 2)     Status: Normal (Preliminary result)   Collection Time   06/23/11  5:58 PM      Component Value Range Status Comment   Specimen Description BLOOD LEFT FOREARM   Final    Special Requests BOTTLES DRAWN AEROBIC AND ANAEROBIC 10CC   Final    Setup Time [id number]   Final    Culture     Final    Value:        BLOOD CULTURE RECEIVED NO GROWTH  TO DATE CULTURE WILL BE HELD FOR 5 DAYS BEFORE ISSUING A FINAL NEGATIVE REPORT   Report Status PENDING   Incomplete   CULTURE, BLOOD (ROUTINE X 2)     Status: Normal (Preliminary result)   Collection Time   06/23/11  6:07 PM      Component Value Range Status Comment   Specimen Description BLOOD LEFT HAND   Final    Special Requests     Final    Value: BOTTLES DRAWN AEROBIC AND ANAEROBIC 10CC AER 7CC ANA   Setup Time [id number]   Final    Culture     Final    Value:        BLOOD CULTURE RECEIVED NO GROWTH TO DATE CULTURE WILL BE HELD FOR 5 DAYS BEFORE ISSUING A FINAL NEGATIVE REPORT   Report Status PENDING   Incomplete     Studies/Results: Ct Abdomen Pelvis W Contrast  06/23/2011  *RADIOLOGY REPORT*  Clinical Data: Abdominal pain, nausea, fever, history of pancreatitis  CT ABDOMEN AND PELVIS WITH  CONTRAST  Technique:  Multidetector CT imaging of the abdomen and pelvis was performed following the standard protocol during bolus administration of intravenous contrast.  Contrast: OMNIPAQUE IOHEXOL 300 MG/ML IV SOLN  Comparison: CT abdomen pelvis of 03/26/2010.  Findings: The lung bases are clear other than linear scarring the right lower lobe.  The liver appears diffusely low in attenuation consistent with fatty infiltration with areas of sparing. A low attenuation area in the medial aspect of the medial segment of the left lobe of liver may be postoperative in nature with adjacent surgical clips from prior cholecystectomy noted.  The head of the pancreas is unremarkable.  However there is a large pseudocyst occupying much of the body and proximal tail of the pancreas measuring 9.3 x 6.5 cm.  In addition, there is fluid along the anterior pararenal space on the left tracking into the left pericolic gutter consistent with acute pancreatitis and pancreatic exudate.  There is some edema of the adjacent proximal descending colon as result of this local inflammatory process.  The  kidneys enhance  with no calculus or mass and no hydronephrosis is seen.  The abdominal aorta is normal in caliber.  No adenopathy is seen.  The uterus is normal in size.  An IUD is present centrally.  Small ovarian follicles are present.  No free fluid is noted.  The urinary bladder is not well distended but is unremarkable.  No abnormality of the colon is seen.  No bony abnormality is seen.  IMPRESSION:  1.  Large pancreatic pseudocyst occupying much of the mid body and proximal tail of the pancreas. 2.  Strandiness along the tail of the pancreas with fluid in the left anterior pararenal space and left paracolic gutter consistent with acute pancreatitis and pancreatic exudate. 3.  Fatty infiltration of the liver with areas of sparing.  Original Report Authenticated By: Juline Patch, M.D.   Dg Chest Port 1 View  06/24/2011  *RADIOLOGY REPORT*  Clinical Data: Shortness of breath, fever.  PORTABLE CHEST - 1 VIEW  Comparison: 02/09/2010  Findings: Heart is normal size.  Minimal left basilar density, felt to most likely atelectasis.  The right lung is clear.  No effusions or acute bony abnormality.  IMPRESSION: Minimal left basilar density, likely atelectasis.  Original Report Authenticated By: Cyndie Chime, M.D.    Medications: I have reviewed the patient's current medications.   Principal Problem:  *Pancreatitis, acute Active Problems:  Fever  Leukocytosis  Pseudocyst, pancreas    Assessment and plan: -Patient's pain improved, we'll start her on full liquid diet she is breast-feeding. As she is breast-feeding (and has anaphylaxis to penicillins) will do Aztreonam and clindamycin have reviewed this with the pharmacist. Call GI for endoscopic drainage they will call back to see if they can do this endoscopically. If not will have to be performed by IR. Surgery consulted agree with the drainage.  -Blood cultures pending start imipenem.  LOS: 2 days   Marinda Elk M.D. Pager: [phone] [facility] 06/25/2011, 9:19 AM

## 2011-06-25 NOTE — Progress Notes (Signed)
Subjective: Pt ok, feeling better. Eating some fulls. No N/V  Objective: Vital signs in last 24 hours: Temp:  [98.4 F (36.9 C)-100.7 F (38.2 C)] 98.7 F (37.1 C) (12/23 0609) Pulse Rate:  [94-103] 94  (12/23 0609) Resp:  [20] 20  (12/23 0609) BP: (103-119)/(68-75) 112/68 mmHg (12/23 0609) SpO2:  [90 %-94 %] 94 % (12/23 0609)    Intake/Output this shift:    Physical Exam: BP 112/68  Pulse 94  Temp(Src) 98.7 F (37.1 C) (Oral)  Resp 20  Ht 5\' 2"  (1.575 m)  Wt 168 lb (76.204 kg)  BMI 30.73 kg/m2  SpO2 94%  LMP 06/23/2011 Abdomen: soft, ND, mildly tender  Labs: CBC  Basename 06/24/11 0630 06/23/11 1300  WBC 12.8* 17.2*  HGB 11.2* 13.5  HCT 33.4* 38.7  PLT 154 196   BMET  Basename 06/24/11 0630 06/23/11 1748  NA 139 134*  K 3.2* 3.3*  CL 104 101  CO2 23 21  GLUCOSE 101* 126*  BUN 5* 6  CREATININE 0.61 0.58  CALCIUM 8.7 8.4   LFT  Basename 06/24/11 0630 06/23/11 1748  PROT 7.1 --  ALBUMIN 3.0* --  AST 27 --  ALT 42* --  ALKPHOS 85 --  BILITOT 0.8 --  BILIDIR -- --  IBILI -- --  LIPASE -- 285*   PT/INR No results found for this basename: LABPROT:2,INR:2 in the last 72 hours ABG No results found for this basename: PHART:2,PCO2:2,PO2:2,HCO3:2 in the last 72 hours  Studies/Results: Ct Abdomen Pelvis W Contrast  06/23/2011  *RADIOLOGY REPORT*  Clinical Data: Abdominal pain, nausea, fever, history of pancreatitis  CT ABDOMEN AND PELVIS WITH CONTRAST  Technique:  Multidetector CT imaging of the abdomen and pelvis was performed following the standard protocol during bolus administration of intravenous contrast.  Contrast: OMNIPAQUE IOHEXOL 300 MG/ML IV SOLN  Comparison: CT abdomen pelvis of 03/26/2010.  Findings: The lung bases are clear other than linear scarring the right lower lobe.  The liver appears diffusely low in attenuation consistent with fatty infiltration with areas of sparing. A low attenuation area in the medial aspect of the medial  segment of the left lobe of liver may be postoperative in nature with adjacent surgical clips from prior cholecystectomy noted.  The head of the pancreas is unremarkable.  However there is a large pseudocyst occupying much of the body and proximal tail of the pancreas measuring 9.3 x 6.5 cm.  In addition, there is fluid along the anterior pararenal space on the left tracking into the left pericolic gutter consistent with acute pancreatitis and pancreatic exudate.  There is some edema of the adjacent proximal descending colon as result of this local inflammatory process.  The  kidneys enhance with no calculus or mass and no hydronephrosis is seen.  The abdominal aorta is normal in caliber.  No adenopathy is seen.  The uterus is normal in size.  An IUD is present centrally.  Small ovarian follicles are present.  No free fluid is noted.  The urinary bladder is not well distended but is unremarkable.  No abnormality of the colon is seen.  No bony abnormality is seen.  IMPRESSION:  1.  Large pancreatic pseudocyst occupying much of the mid body and proximal tail of the pancreas. 2.  Strandiness along the tail of the pancreas with fluid in the left anterior pararenal space and left paracolic gutter consistent with acute pancreatitis and pancreatic exudate. 3.  Fatty infiltration of the liver with areas of sparing.  Original Report Authenticated By: Juline Patch, M.D.   Dg Chest Port 1 View  06/24/2011  *RADIOLOGY REPORT*  Clinical Data: Shortness of breath, fever.  PORTABLE CHEST - 1 VIEW  Comparison: 02/09/2010  Findings: Heart is normal size.  Minimal left basilar density, felt to most likely atelectasis.  The right lung is clear.  No effusions or acute bony abnormality.  IMPRESSION: Minimal left basilar density, likely atelectasis.  Original Report Authenticated By: Cyndie Chime, M.D.    Assessment: Principal Problem:  *Pancreatitis, acute Active Problems:  Fever  Leukocytosis  Pseudocyst, pancreas       Plan: Rec low fat diet Await GI recs, would not recommend external/perc drainage by IR. If cannot do endo drainage, looks like she may be able to go home and followed up as outpt with serial CTs to monitor.  LOS: 2 days    Iona Coach 06/25/2011

## 2011-06-26 MED ORDER — MORPHINE SULFATE 30 MG PO TABS: 30.0000 mg | ORAL_TABLET | ORAL | Status: AC | PRN

## 2011-06-26 MED ORDER — SERTRALINE HCL 50 MG PO TABS: 50.0000 mg | ORAL_TABLET | Freq: Every day | ORAL | Status: DC

## 2011-06-26 MED ORDER — CLINDAMYCIN HCL 150 MG PO CAPS: 300.0000 mg | ORAL_CAPSULE | Freq: Three times a day (TID) | ORAL | Status: AC

## 2011-06-26 NOTE — Consult Note (Signed)
Patient Identification:  [name] Date of Evaluation:  06/26/2011   History of Present Illness:  30 year old Caucasian female with history of bipolar disorder followed by Dr. Tomasa Rand in the outpatient setting. Patient is currently on Depakote Zyprexa and Zoloft. Patient has a history of being admitted to psychiatry when she was less than 43 year old but since then she never been admitted to psych hospital. Patient is married for the last 12 years has 4 kids stays with the husband who is a tow IT trainer. Patient denied that she said anything that she want to hurt her 9 month old baby. Husband reported no current psychosocial stressors. Patient reported sleep and appetite fair.  Pt discussed that she was frustrated on Sunday because she had been told multiple different things about the medical treatment that she needed and was trying to care for her 71 month old child and denies threatening her child.    Mental Status Examination: Patient is currently logical and goal directed not hallucinating or delusional no psychomotor agitation or retardation present. Patient is not suicidal or homicidal. Patient is calm cooperative and pleasant on approach. Her mood is euthymic affect mood congruent. Cognition alert awake oriented x3 attention concentration good abstract ability good insight and judgment intact.    Past Medical History:     Past Medical History  Diagnosis Date  . Pancreatitis   . Bipolar 1 disorder        Past Surgical History  Procedure Date  . Cholecystectomy     Filed Vitals:   06/26/11 0602  BP: 88/58  Pulse: 86  Temp: 98.4 F (36.9 C)  Resp: 18    Lab Results:   BMET    Component Value Date/Time   NA 139 06/24/2011 0630   K 3.2* 06/24/2011 0630   CL 104 06/24/2011 0630   CO2 23 06/24/2011 0630   GLUCOSE 101* 06/24/2011 0630   BUN 5* 06/24/2011 0630   CREATININE 0.61 06/24/2011 0630   CALCIUM 8.7 06/24/2011 0630   GFRNONAA >90 06/24/2011 0630   GFRAA  >90 06/24/2011 0630    Allergies:  Allergies  Allergen Reactions  . Penicillins Anaphylaxis    Current Medications:  Prior to Admission medications   Medication Sig Start Date End Date Taking? Authorizing Provider  divalproex (DEPAKOTE ER) 500 MG 24 hr tablet Take 500 mg by mouth at bedtime.     Yes Historical Provider, MD  lamoTRIgine (LAMICTAL) 100 MG tablet Take 100 mg by mouth at bedtime.     Yes Historical Provider, MD  OLANZapine (ZYPREXA) 10 MG tablet Take 10 mg by mouth at bedtime.     Yes Historical Provider, MD    Social History:    reports that she has been smoking Cigarettes.  She has been smoking about .25 packs per day. She does not have any smokeless tobacco history on file. She reports that she does not drink alcohol or use illicit drugs.   Family History:    History reviewed. No pertinent family history.   DIAGNOSIS:   AXIS I  bipolar disorder by history   AXIS II  Deffered  AXIS III See medical notes.  AXIS IV  none   AXIS V 65     Recommendations:  Patient can be discharged to followup in the outpatient setting.  Husband has no concerns for the safety of the patient and kids.  Patient does not meet criteria at this time to be committed on IVC.   Eulogio Ditch, MD

## 2011-06-26 NOTE — Discharge Summary (Signed)
Admit date: 06/23/2011 Discharge date: 06/26/2011  Primary Care Physician:  No primary provider on file.   Discharge Diagnoses:   Active Hospital Problems  Diagnoses Date Noted   . Pseudocyst, pancreas 06/25/2011     Resolved Hospital Problems  Diagnoses Date Noted Date Resolved  . Pancreatitis, acute 06/23/2011 06/26/2011  . Fever 06/23/2011 06/26/2011  . Leukocytosis 06/23/2011 06/26/2011     DISCHARGE MEDICATION: Current Discharge Medication List    START taking these medications   Details  clindamycin (CLEOCIN) 150 MG capsule Take 2 capsules (300 mg total) by mouth 3 (three) times daily. Qty: 33 capsule, Refills: 0    morphine (MSIR) 30 MG tablet Take 1 tablet (30 mg total) by mouth every 4 (four) hours as needed for pain. Qty: 30 tablet, Refills: 0    sertraline (ZOLOFT) 50 MG tablet Take 1 tablet (50 mg total) by mouth daily.      CONTINUE these medications which have NOT CHANGED   Details  divalproex (DEPAKOTE ER) 500 MG 24 hr tablet Take 500 mg by mouth at bedtime.      OLANZapine (ZYPREXA) 10 MG tablet Take 10 mg by mouth at bedtime.        STOP taking these medications     lamoTRIgine (LAMICTAL) 100 MG tablet            Consults: Treatment Team:  Florencia Reasons, MD Barrie Folk, MD   SIGNIFICANT DIAGNOSTIC STUDIES:  Ct Abdomen Pelvis W Contrast  06/23/2011  *RADIOLOGY REPORT*  Clinical Data: Abdominal pain, nausea, fever, history of pancreatitis  CT ABDOMEN AND PELVIS WITH CONTRAST  Technique:  Multidetector CT imaging of the abdomen and pelvis was performed following the standard protocol during bolus administration of intravenous contrast.  Contrast: OMNIPAQUE IOHEXOL 300 MG/ML IV SOLN  Comparison: CT abdomen pelvis of 03/26/2010.  Findings: The lung bases are clear other than linear scarring the right lower lobe.  The liver appears diffusely low in attenuation consistent with fatty infiltration with areas of sparing. A low attenuation area  in the medial aspect of the medial segment of the left lobe of liver may be postoperative in nature with adjacent surgical clips from prior cholecystectomy noted.  The head of the pancreas is unremarkable.  However there is a large pseudocyst occupying much of the body and proximal tail of the pancreas measuring 9.3 x 6.5 cm.  In addition, there is fluid along the anterior pararenal space on the left tracking into the left pericolic gutter consistent with acute pancreatitis and pancreatic exudate.  There is some edema of the adjacent proximal descending colon as result of this local inflammatory process.  The  kidneys enhance with no calculus or mass and no hydronephrosis is seen.  The abdominal aorta is normal in caliber.  No adenopathy is seen.  The uterus is normal in size.  An IUD is present centrally.  Small ovarian follicles are present.  No free fluid is noted.  The urinary bladder is not well distended but is unremarkable.  No abnormality of the colon is seen.  No bony abnormality is seen.  IMPRESSION:  1.  Large pancreatic pseudocyst occupying much of the mid body and proximal tail of the pancreas. 2.  Strandiness along the tail of the pancreas with fluid in the left anterior pararenal space and left paracolic gutter consistent with acute pancreatitis and pancreatic exudate. 3.  Fatty infiltration of the liver with areas of sparing.  Original Report Authenticated By: Juline Patch,  M.D.   Dg Chest Port 1 View  06/24/2011  *RADIOLOGY REPORT*  Clinical Data: Shortness of breath, fever.  PORTABLE CHEST - 1 VIEW  Comparison: 02/09/2010  Findings: Heart is normal size.  Minimal left basilar density, felt to most likely atelectasis.  The right lung is clear.  No effusions or acute bony abnormality.  IMPRESSION: Minimal left basilar density, likely atelectasis.  Original Report Authenticated By: Cyndie Chime, M.D.     Recent Results (from the past 240 hour(s))  CULTURE, BLOOD (ROUTINE X 2)     Status:  Normal (Preliminary result)   Collection Time   06/23/11  5:58 PM      Component Value Range Status Comment   Specimen Description BLOOD LEFT FOREARM   Final    Special Requests BOTTLES DRAWN AEROBIC AND ANAEROBIC 10CC   Final    Setup Time [id number]   Final    Culture     Final    Value:        BLOOD CULTURE RECEIVED NO GROWTH TO DATE CULTURE WILL BE HELD FOR 5 DAYS BEFORE ISSUING A FINAL NEGATIVE REPORT   Report Status PENDING   Incomplete   CULTURE, BLOOD (ROUTINE X 2)     Status: Normal (Preliminary result)   Collection Time   06/23/11  6:07 PM      Component Value Range Status Comment   Specimen Description BLOOD LEFT HAND   Final    Special Requests     Final    Value: BOTTLES DRAWN AEROBIC AND ANAEROBIC 10CC AER 7CC ANA   Setup Time [id number]   Final    Culture     Final    Value:        BLOOD CULTURE RECEIVED NO GROWTH TO DATE CULTURE WILL BE HELD FOR 5 DAYS BEFORE ISSUING A FINAL NEGATIVE REPORT   Report Status PENDING   Incomplete     BRIEF ADMITTING H & P: This 30 year old with past medical history of 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 We'll have her baby about 6 months ago. He came into the emergency room for abdominal pain the prior to admission. She relates this all started the night prior to admission she took some Dilaudid this helped. The pain progressively got worse. To the floor she cannot take anything by mouth because it hurts her. She relates that intake of food or water reason pills makes her abdominal pain worse the pain medication makes it better. She relates no chills. She had a temperature here in the hospital one 101.3  She relates she last bowel movement was the day prior to admission, he had some nausea but no vomiting. No diarrhea. She's currently having her period. The lateral discharge.   Active Hospital Problems  Diagnoses Date Noted   . Pseudocyst, pancreas: Surgery was consulted and they recommended GI  IR was consulted ulcer agreed. I have called Dr. Dulce Sellar, he relates can do this as an outpatient next week. So she is scheduled followup appointment with him for endoscopic drainage of the pseudocyst. Will send her out on clindamycin only as the patient is breast feeding and has and anaphylaxis to PCN. I talked over with infectious disease and they recommended to treat for a short period (with antibiotics) and have this pseudocyst drain early next week. 06/25/2011     Resolved Hospital Problems  Diagnoses Date Noted Date Resolved  . Pancreatitis, acute: This resolved with conservative management IV fluids, NPO and pain medication.  She will followup with Dr. Dulce Sellar.  06/23/2011 06/26/2011  . Fever: Due to her recurrent fevers she had to be started on Astreonam in am and clindamycin for 48hrs (I explained to her that we needed to observe her for 72 hrs but she did not want to spend christmas in the hospital). The patient wanted to leave on the 24th of December, it was explained to her the risk and benefits of doing this and she still wanted to leave; due to her severe anaphylaxis to penicillin and her breast-feeding. She will go home on clindamycin and for 10 days. She will follow up with GI as an outpatient early next week. 06/23/2011 06/26/2011  . Leukocytosis: Blood cultures were drawn, which were negative x2 this probably secondary stress emargination.  06/23/2011 06/26/2011     Disposition and Follow-up:  Discharge Orders    Future Orders Please Complete By Expires   Diet - low sodium heart healthy      Increase activity slowly        Follow-up Information    Follow up with Freddy Jaksch, MD in 1 week. (endoscopic EUS for aspiration of pseudocyst)    Contact information:   [address] [employer] [facility] [place] [address] [phone]           DISCHARGE EXAM:  See progress  Blood pressure 119/70, pulse 90, temperature 99.7 F  (37.6 C), temperature source Oral, resp. rate 18, height 5\' 2"  (1.575 m), weight 76.204 kg (168 lb), last menstrual period 06/23/2011, SpO2 96.00%.   Basename 06/24/11 0630 06/23/11 1748  NA 139 134*  K 3.2* 3.3*  CL 104 101  CO2 23 21  GLUCOSE 101* 126*  BUN 5* 6  CREATININE 0.61 0.58  CALCIUM 8.7 8.4  MG -- --  PHOS -- --    Basename 06/24/11 0630 06/23/11 1748  AST 27 29  ALT 42* 41*  ALKPHOS 85 73  BILITOT 0.8 0.7  PROT 7.1 7.3  ALBUMIN 3.0* 3.5    Basename 06/23/11 1748  LIPASE 285*  AMYLASE --    Basename 06/24/11 0630  WBC 12.8*  NEUTROABS --  HGB 11.2*  HCT 33.4*  MCV 88.4  PLT 154    Signed: Marinda Elk M.D. 06/26/2011, 4:42 PM
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
## 2011-06-26 NOTE — ED Provider Notes (Signed)
Medical screening examination/treatment/procedure(s) were performed by non-physician practitioner and as supervising physician I was immediately available for consultation/collaboration.   Makinsey Pepitone M Finnis Colee, DO 06/26/11 0928 

## 2011-06-26 NOTE — Progress Notes (Signed)
Utilization review completed. [name] 06/26/2011 
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
## 2011-06-26 NOTE — Progress Notes (Signed)
Patient ID: [name], female   DOB: Mar 22, 1981, 30 y.o.   MRN: [id number]    Subjective: No complaints.  Denies abdominal pain.  +fever last night  Objective: Vital signs in last 24 hours: Temp:  [98.1 F (36.7 C)-101.1 F (38.4 C)] 98.4 F (36.9 C) (12/24 0602) Pulse Rate:  [86-99] 86  (12/24 0602) Resp:  [18-20] 18  (12/24 0602) BP: (88-122)/(58-80) 88/58 mmHg (12/24 0602) SpO2:  [86 %-96 %] 96 % (12/24 0622)    Intake/Output this shift:    Physical Exam: BP 88/58  Pulse 86  Temp(Src) 98.4 F (36.9 C) (Oral)  Resp 18  Ht 5\' 2"  (1.575 m)  Wt 168 lb (76.204 kg)  BMI 30.73 kg/m2  SpO2 96%  LMP 06/23/2011 Abdomen soft, non tender, no guarding  Labs: CBC  Basename 06/24/11 0630 06/23/11 1300  WBC 12.8* 17.2*  HGB 11.2* 13.5  HCT 33.4* 38.7  PLT 154 196   BMET  Basename 06/24/11 0630 06/23/11 1748  NA 139 134*  K 3.2* 3.3*  CL 104 101  CO2 23 21  GLUCOSE 101* 126*  BUN 5* 6  CREATININE 0.61 0.58  CALCIUM 8.7 8.4   LFT  Basename 06/24/11 0630 06/23/11 1748  PROT 7.1 --  ALBUMIN 3.0* --  AST 27 --  ALT 42* --  ALKPHOS 85 --  BILITOT 0.8 --  BILIDIR -- --  IBILI -- --  LIPASE -- 285*   PT/INR No results found for this basename: LABPROT:2,INR:2 in the last 72 hours ABG No results found for this basename: PHART:2,PCO2:2,PO2:2,HCO3:2 in the last 72 hours  Studies/Results: Dg Chest Port 1 View  06/24/2011  *RADIOLOGY REPORT*  Clinical Data: Shortness of breath, fever.  PORTABLE CHEST - 1 VIEW  Comparison: 02/09/2010  Findings: Heart is normal size.  Minimal left basilar density, felt to most likely atelectasis.  The right lung is clear.  No effusions or acute bony abnormality.  IMPRESSION: Minimal left basilar density, likely atelectasis.  Original Report Authenticated By: Cyndie Chime, M.D.    Assessment: Principal Problem:  *Pancreatitis, acute Active Problems:  Fever  Leukocytosis  Pseudocyst, pancreas     Plan: Recommend GI to  attempt endoscopic drainage.  Percutaneous drainage likely to fail.  If cannot be endo drained, would have to mature further for open drainage  LOS: 3 days    Yossef Gilkison A 06/26/2011

## 2011-06-26 NOTE — Progress Notes (Addendum)
Patient ID: [name], female   DOB: 03/25/1981, 30 y.o.   MRN: [id number]   Pt with hx of pancreatic pseudocysts. Has had GI endocyst aspiration previously. Dr Ruel Favors has reviewed all films and notes and agrees With surgery that this should be once again be aspirated/drained in Endo. Pt aware. Thank you

## 2011-06-30 LAB — CULTURE, BLOOD (ROUTINE X 2)
Culture  Setup Time: 201212220105
Culture: NO GROWTH

## 2011-07-31 ENCOUNTER — Encounter (HOSPITAL_COMMUNITY): Payer: Self-pay | Admitting: *Deleted

## 2011-07-31 ENCOUNTER — Encounter (HOSPITAL_COMMUNITY): Payer: Self-pay

## 2011-07-31 ENCOUNTER — Ambulatory Visit (HOSPITAL_COMMUNITY)
Admission: RE | Admit: 2011-07-31 | Discharge: 2011-07-31 | Disposition: A | Discharge status: Home / Self Care | Payer: BC Managed Care – PPO | Source: Ambulatory Visit | Attending: Gastroenterology | Admitting: Gastroenterology

## 2011-07-31 NOTE — Anesthesia Preprocedure Evaluation (Addendum)
Anesthesia Evaluation  Patient identified by MRN, date of birth, ID band Patient awake    Reviewed: Allergy & Precautions, H&P , NPO status , Patient's Chart, lab work & pertinent test results  Airway Mallampati: II TM Distance: >3 FB Neck ROM: Full    Dental No notable dental hx. (+) Dental Advisory Given,    Pulmonary neg pulmonary ROS,  clear to auscultation  Pulmonary exam normal       Cardiovascular neg cardio ROS Regular Normal    Neuro/Psych Bipolar Disorder Negative Neurological ROS  Negative Psych ROS   GI/Hepatic negative GI ROS, Neg liver ROS,   Endo/Other  Negative Endocrine ROSDiabetes mellitus-Gestational DM  Renal/GU negative Renal ROS  Genitourinary negative   Musculoskeletal negative musculoskeletal ROS (+)   Abdominal   Peds negative pediatric ROS (+)  Hematology negative hematology ROS (+)   Anesthesia Other Findings   Reproductive/Obstetrics negative OB ROS                         Anesthesia Physical Anesthesia Plan  ASA: I  Anesthesia Plan: MAC   Post-op Pain Management:    Induction:   Airway Management Planned:   Additional Equipment:   Intra-op Plan:   Post-operative Plan:   Informed Consent: I have reviewed the patients History and Physical, chart, labs and discussed the procedure including the risks, benefits and alternatives for the proposed anesthesia with the patient or authorized representative who has indicated his/her understanding and acceptance.   Dental advisory given  Plan Discussed with:   Anesthesia Plan Comments:         Anesthesia Quick Evaluation

## 2011-08-02 ENCOUNTER — Ambulatory Visit (HOSPITAL_COMMUNITY): Payer: BC Managed Care – PPO | Admitting: Anesthesiology

## 2011-08-02 ENCOUNTER — Encounter (HOSPITAL_COMMUNITY)
Admission: RE | Disposition: A | Discharge status: Home / Self Care | Payer: Self-pay | Source: Ambulatory Visit | Attending: Gastroenterology

## 2011-08-02 ENCOUNTER — Encounter (HOSPITAL_COMMUNITY): Payer: Self-pay | Admitting: *Deleted

## 2011-08-02 ENCOUNTER — Ambulatory Visit (HOSPITAL_COMMUNITY)
Admission: RE | Admit: 2011-08-02 | Discharge: 2011-08-02 | Disposition: A | Discharge status: Home / Self Care | Payer: BC Managed Care – PPO | Source: Ambulatory Visit | Attending: Gastroenterology | Admitting: Gastroenterology

## 2011-08-02 ENCOUNTER — Encounter (HOSPITAL_COMMUNITY): Payer: Self-pay | Admitting: Anesthesiology

## 2011-08-02 DIAGNOSIS — Z9089 Acquired absence of other organs: Secondary | ICD-10-CM | POA: Insufficient documentation

## 2011-08-02 DIAGNOSIS — K863 Pseudocyst of pancreas: Secondary | ICD-10-CM | POA: Insufficient documentation

## 2011-08-02 DIAGNOSIS — Z79899 Other long term (current) drug therapy: Secondary | ICD-10-CM | POA: Insufficient documentation

## 2011-08-02 DIAGNOSIS — F17200 Nicotine dependence, unspecified, uncomplicated: Secondary | ICD-10-CM | POA: Insufficient documentation

## 2011-08-02 DIAGNOSIS — K862 Cyst of pancreas: Secondary | ICD-10-CM | POA: Insufficient documentation

## 2011-08-02 HISTORY — PX: EUS: SHX5427

## 2011-08-02 HISTORY — PX: FINE NEEDLE ASPIRATION: SHX5430

## 2011-08-02 SURGERY — UPPER ENDOSCOPIC ULTRASOUND (EUS) RADIAL
Anesthesia: Monitor Anesthesia Care

## 2011-08-02 MED ORDER — SODIUM CHLORIDE 0.9 % IV SOLN
Freq: Once | INTRAVENOUS | Status: AC
  Administered 2011-08-02: 1000 mL via INTRAVENOUS

## 2011-08-02 MED ORDER — PROPOFOL 10 MG/ML IV EMUL
INTRAVENOUS | Status: DC | PRN
  Administered 2011-08-02: 180 ug/kg/min via INTRAVENOUS

## 2011-08-02 MED ORDER — MIDAZOLAM HCL 5 MG/5ML IJ SOLN
INTRAMUSCULAR | Status: DC | PRN
  Administered 2011-08-02: 2 mg via INTRAVENOUS

## 2011-08-02 MED ORDER — LACTATED RINGERS IV SOLN
INTRAVENOUS | Status: DC | PRN
  Administered 2011-08-02: 08:00:00 via INTRAVENOUS

## 2011-08-02 MED ORDER — FENTANYL CITRATE 0.05 MG/ML IJ SOLN
INTRAMUSCULAR | Status: DC | PRN
  Administered 2011-08-02: 50 ug via INTRAVENOUS

## 2011-08-02 MED ORDER — BUTAMBEN-TETRACAINE-BENZOCAINE 2-2-14 % EX AERO
INHALATION_SPRAY | CUTANEOUS | Status: DC | PRN
  Administered 2011-08-02: 2 via TOPICAL

## 2011-08-02 MED ORDER — KETAMINE HCL 10 MG/ML IJ SOLN
INTRAMUSCULAR | Status: DC | PRN
  Administered 2011-08-02: 5 mg via INTRAVENOUS
  Administered 2011-08-02: 10 mg via INTRAVENOUS

## 2011-08-02 NOTE — Brief Op Note (Signed)
Please see EndoPro note dated 08/02/11.  

## 2011-08-02 NOTE — Op Note (Signed)
[facility] [address] [place], [place]  [address]  ENDOSCOPIC ULTRASOUND PROCEDURE REPORT  PATIENT:  [name], [name]  MR#:  [id number] BIRTHDATE:  01-Jul-1981  GENDER:  female  ENDOSCOPIST:  Willis Modena, MD REFERRED BY:  Duane Lope, M.D.  PROCEDURE DATE:  08/02/2011 PROCEDURE:  Upper EUS ASA CLASS:  Class II INDICATIONS:  recurrent pancreatitis  MEDICATIONS:   Cetacaine spray x 2, MAC sedation, administered by CRNA  DESCRIPTION OF PROCEDURE:   After the risks benefits and alternatives of the procedure were  explained, informed consent was obtained. The patient was then placed in the left, lateral, decubitus postion and IV sedation was administered. Throughout the procedure, the patient's blood pressure, pulse and oxygen saturations were monitored continuously.  Under direct visualization, the G110019(edit list) and ERCP 110520 endoscope was introduced through the mouth and advanced to the second portion of the duodenum.  Water was used as necessary to provide an acoustic interface.  Upon completion of the imaging, water was removed and the patient was sent to the recovery room in satisfactory condition.  <<PROCEDUREIMAGES>>  FINDNGS:  Diffusely hypoechoic body and tail of pancreas, otherwise fairly normal-appearing.  Some pancreatic atrophy towards the tail.  Previously seen cyst in body/tail of pancreas appears to have markedly decreased in size, and is rather amorphous.  Head and uncinate pancreas with more smoldering inflammatory changes.  Bile duct is diminutive with symmetrical (reactive-appearing) wall thickening.  Post-cholecystectomy.  No bile duct stones seen. Diffusely hyperechoic liver consistent with fatty liver.  Few benign-appearing periportal and peripancreatic lymph nodes.  ENDOSCOPIC IMPRESSION:    1.  Some mild smoldering changes from patient's prior pancreatitis. 2.  No bile duct stones seen. 3.  Decrease in size of pancreatic  cyst. 4.  Suspect patient's recent pancreatitis was from passage of bile duct stone.  RECOMMENDATIONS:      1. Watch for potential complications of procedure. 2. Follow conservatively (low fat diet, advancing as tolerated) 3. If patient has recurrent attack of pancreatitis, consider evaluation for autoimmune pancreatitis             and perhaps consider evaluation for Sphinter of Oddi dysfunction. 4.  OV 4-6 weeks.  ______________________________ Willis Modena  CC:  n. eSIGNEDWillis Modena at 08/02/2011 09:23 AM  Ebony Cargo, [phone]

## 2011-08-02 NOTE — H&P (Signed)
Patient interval history reviewed.  Patient examined again.  There has been no change from documented H/P dated 08/01/2011 (scanned into chart from our office) except as documented above.

## 2011-08-02 NOTE — Anesthesia Postprocedure Evaluation (Signed)
  Anesthesia Post-op Note  Patient: [name]  Procedure(s) Performed:  UPPER ENDOSCOPIC ULTRASOUND (EUS) RADIAL; FINE NEEDLE ASPIRATION (FNA) RADIAL  Patient Location: PACU  Anesthesia Type: MAC  Level of Consciousness: awake and alert   Airway and Oxygen Therapy: Patient Spontanous Breathing  Post-op Pain: mild  Post-op Assessment: Post-op Vital signs reviewed, Patient's Cardiovascular Status Stable, Respiratory Function Stable, Patent Airway and No signs of Nausea or vomiting  Post-op Vital Signs: stable  Complications: No apparent anesthesia complications

## 2011-08-02 NOTE — Transfer of Care (Signed)
Immediate Anesthesia Transfer of Care Note  Patient: [name]  Procedure(s) Performed:  UPPER ENDOSCOPIC ULTRASOUND (EUS) RADIAL; FINE NEEDLE ASPIRATION (FNA) RADIAL  Patient Location: PACU  Anesthesia Type: MAC  Level of Consciousness: awake and alert   Airway & Oxygen Therapy: Patient Spontanous Breathing  Post-op Assessment: Report given to PACU RN and Post -op Vital signs reviewed and stable  Post vital signs: Reviewed and stable  Complications: No apparent anesthesia complications

## 2011-08-03 ENCOUNTER — Encounter (HOSPITAL_COMMUNITY): Payer: Self-pay | Admitting: Gastroenterology

## 2012-05-16 IMAGING — CT CT ABD-PELV W/ CM
2 of 4 series · 17 of 46 positions shown, 19 images · IV contrast (water/omni  & 100ml omni 300)
Comparison: CT 02/09/2010, 01/29/2010

CLINICAL DATA: Pancreatitis following ERCP.

CT ABDOMEN AND PELVIS WITH CONTRAST
TECHNIQUE: Multidetector CT imaging of the abdomen and pelvis was
performed following the standard protocol during bolus
administration of intravenous contrast.
Contrast: 100 ml Omnipaque 300

[Series 2: routine abdomen · axial · 0.83mm/px · z∈[-428,-8]mm · 14 of 92 slices shown, 16 images]
[im 4/92  soft-tissue]
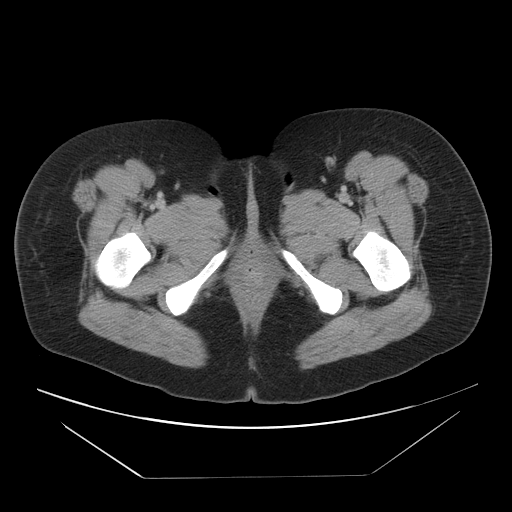
[im 4/92  bone]
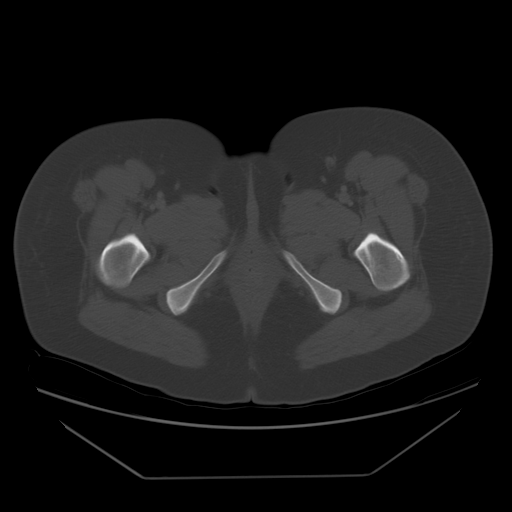
[im 12/92  soft-tissue]
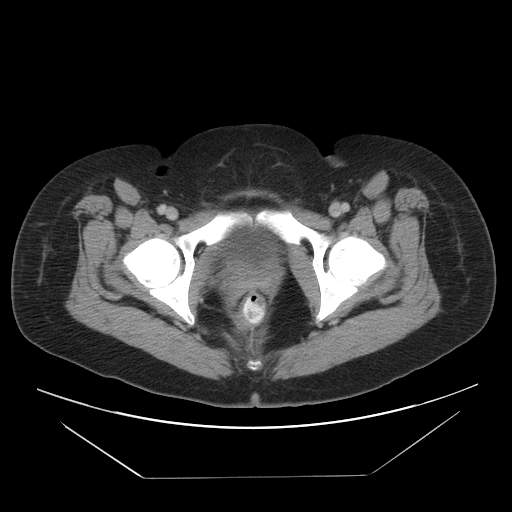
[im 19/92  soft-tissue]
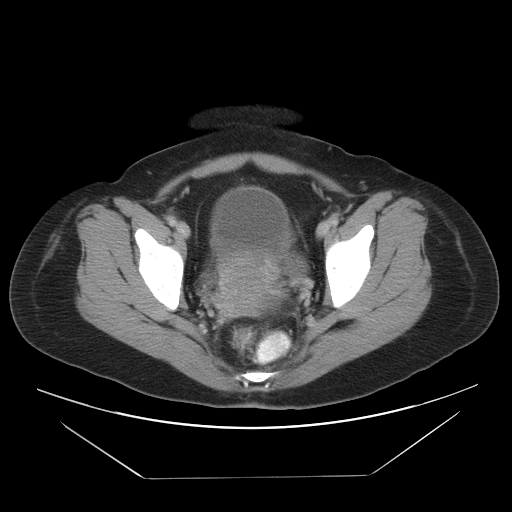
[im 23/92  soft-tissue]
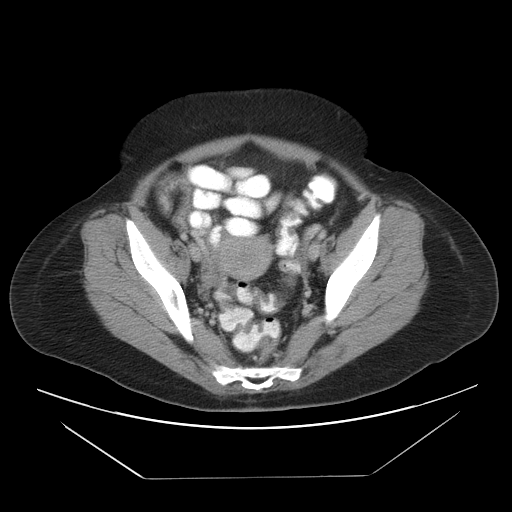
[im 31/92  soft-tissue]
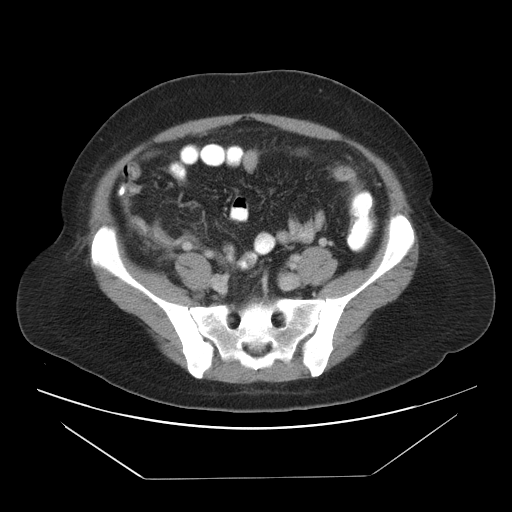
[im 38/92  soft-tissue]
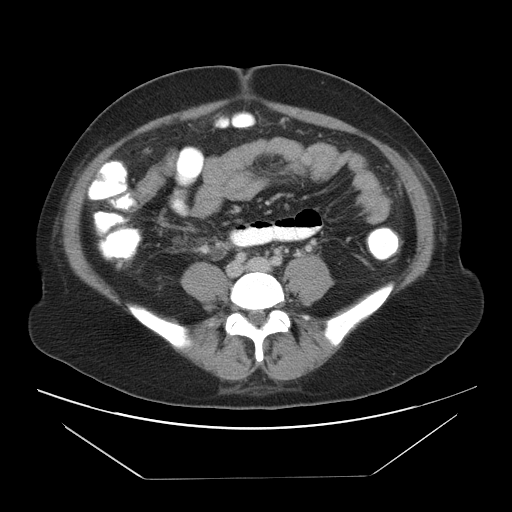
[im 42/92  soft-tissue]
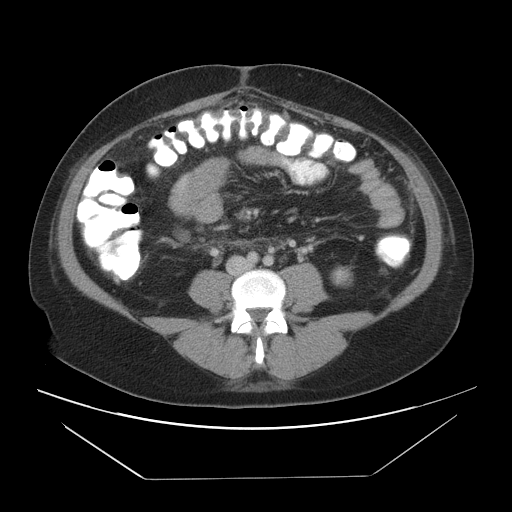
[im 50/92  soft-tissue]
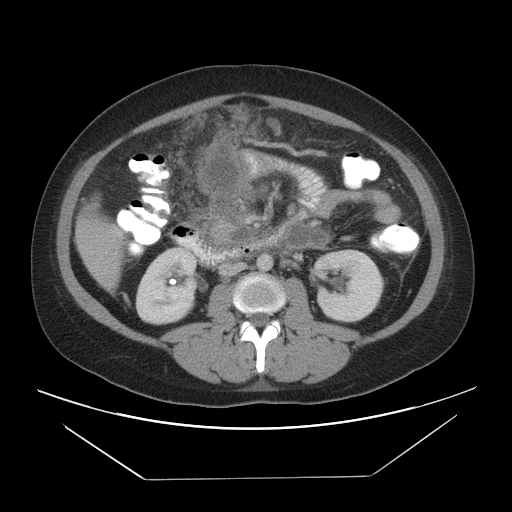
[im 54/92  soft-tissue]
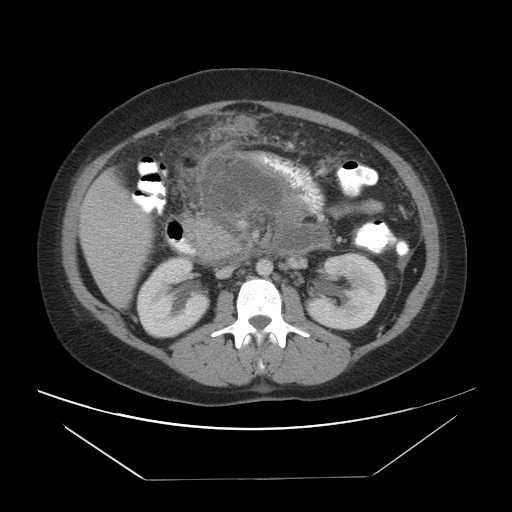
[im 54/92  bone]
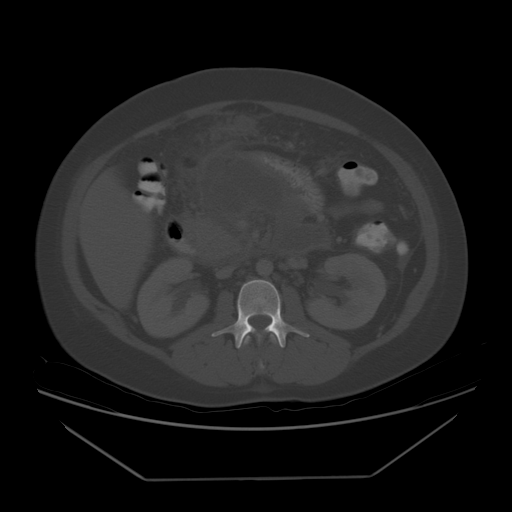
[im 61/92  soft-tissue]
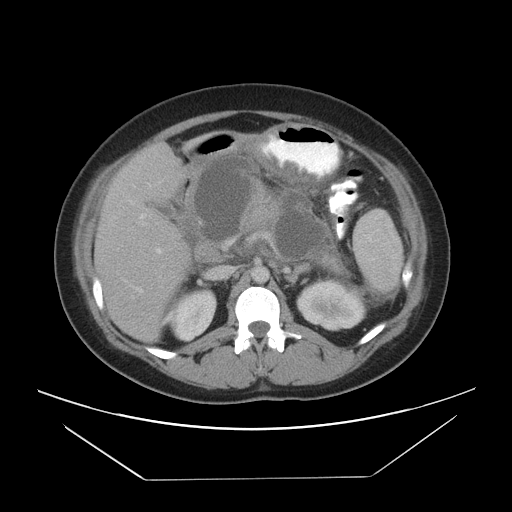
[im 69/92  soft-tissue]
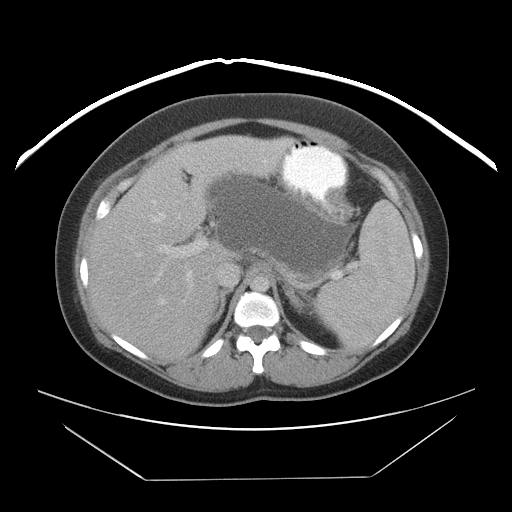
[im 73/92  soft-tissue]
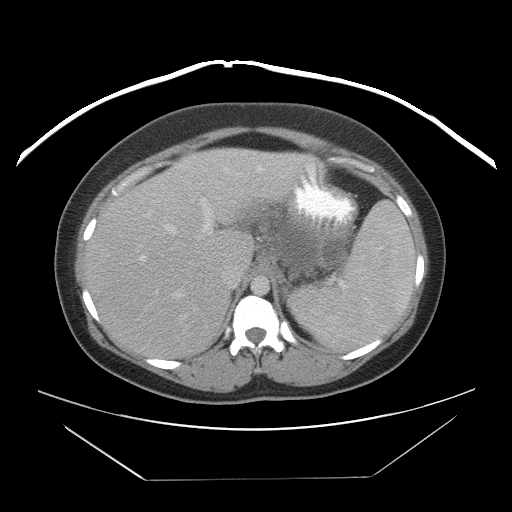
[im 80/92  soft-tissue]
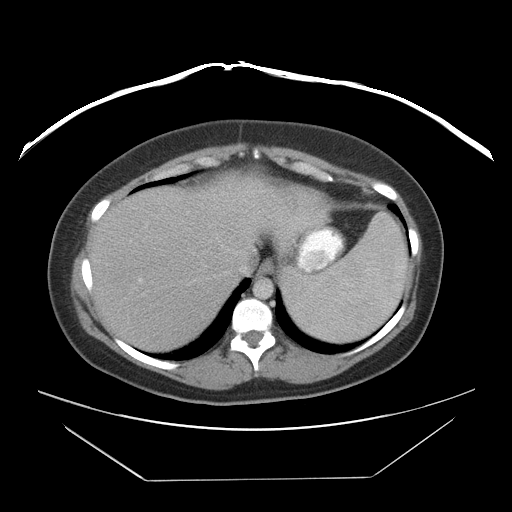
[im 88/92  soft-tissue]
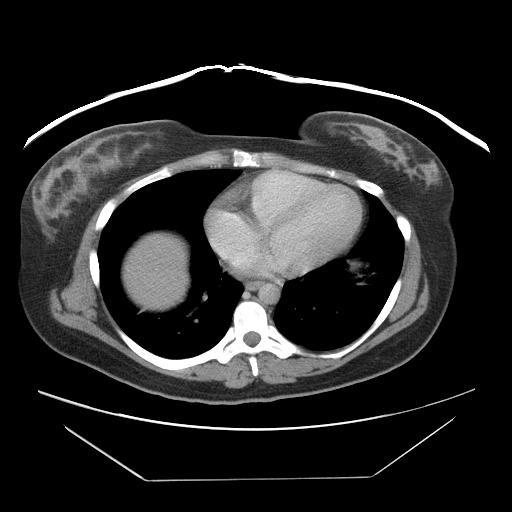

[Series 401: cor · coronal · 0.92mm/px · 3 of 102 slices shown]
[im 34/102  soft-tissue]
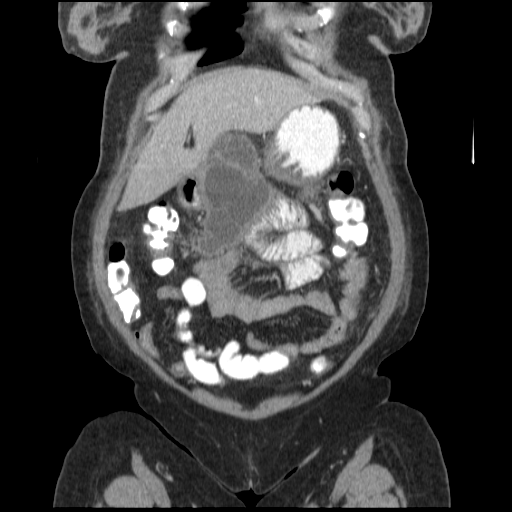
[im 45/102  soft-tissue]
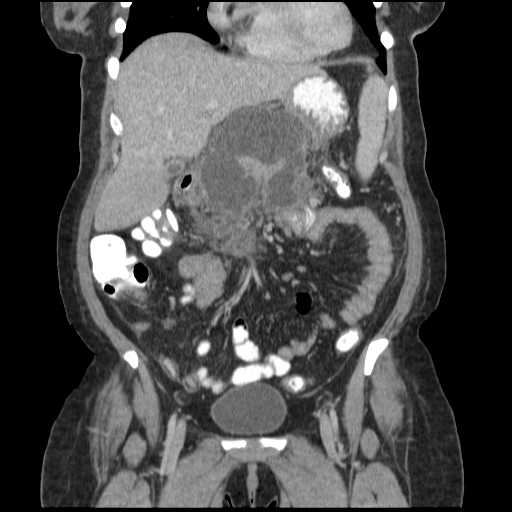
[im 57/102  soft-tissue]
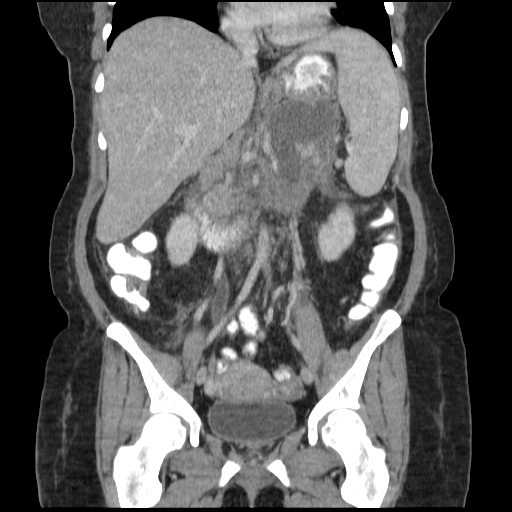

[17 of 46 positions shown; findings below may reference images not displayed]

FINDINGS: Lung bases are clear.  No pericardial effusion.

There is interval development of organized fluid collections with
thin enhancing rim /pseudocapsules consistent with pseudocyst
formation.  There pseudocysts are primarily superior to the
pancreas and along the greater curvature also also extend along the
second portion of the duodenum.  The pseudocysta are multilobulated
but do appear to communicate with each other.  Superiorly
pseudocyst measures 9.6 x 5.3 cm (image 22 )and inferiorly the
pseudocyst measures 6.6 x 10.0 cm (image 36).  The pancreatic head
does enhance and  the tail does enhance.  There is a area of
interruption of pancreatic parenchymal enhancement in the mid body
similar to prior.

No focal hepatic lesion.  The gallbladder is collapsed.  The
spleen, adrenal glands, kidneys appear normal.

There is no evidence of vascular complication at this time.  There
is trace amount free fluid along the pericolic gutters more so on
the left.  There is no evidence of intraperitoneal free air.  There
is inflammatory stranding/thickening within the greater omentum
(image 45). Trace amount free fluid in the pelvis.  The bladder,
uterus, ovaries are normal.

The small bowel and colon appear normal.  Appendix is normal.  No
bony abnormality peri
IMPRESSION: 1.  Interval development of  multilobulated pseudocysts superior
and inferior to the pancreas which appear to communicate with the
each other.
2.  No evidence of vascular complication.
3.  No evidence of intraperitoneal free air or significant free
fluid.

## 2013-05-20 ENCOUNTER — Emergency Department (HOSPITAL_COMMUNITY): Payer: BC Managed Care – PPO

## 2013-05-20 ENCOUNTER — Encounter (HOSPITAL_COMMUNITY): Payer: Self-pay | Admitting: Emergency Medicine

## 2013-05-20 ENCOUNTER — Inpatient Hospital Stay (HOSPITAL_COMMUNITY)
Admission: EM | Admit: 2013-05-20 | Discharge: 2013-05-23 | DRG: 440 | Disposition: A | Discharge status: Home / Self Care | Payer: BC Managed Care – PPO | Attending: Internal Medicine | Admitting: Internal Medicine

## 2013-05-20 DIAGNOSIS — J40 Bronchitis, not specified as acute or chronic: Secondary | ICD-10-CM | POA: Diagnosis present

## 2013-05-20 DIAGNOSIS — K2980 Duodenitis without bleeding: Secondary | ICD-10-CM | POA: Diagnosis present

## 2013-05-20 DIAGNOSIS — F319 Bipolar disorder, unspecified: Secondary | ICD-10-CM | POA: Diagnosis present

## 2013-05-20 DIAGNOSIS — E119 Type 2 diabetes mellitus without complications: Secondary | ICD-10-CM | POA: Diagnosis present

## 2013-05-20 DIAGNOSIS — K859 Acute pancreatitis without necrosis or infection, unspecified: Principal | ICD-10-CM | POA: Diagnosis present

## 2013-05-20 DIAGNOSIS — F17200 Nicotine dependence, unspecified, uncomplicated: Secondary | ICD-10-CM | POA: Diagnosis present

## 2013-05-20 LAB — CBC WITH DIFFERENTIAL/PLATELET
Basophils Absolute: 0 10*3/uL (ref 0.0–0.1)
Basophils Relative: 0 % (ref 0–1)
HCT: 46.2 % — ABNORMAL HIGH (ref 36.0–46.0)
Hemoglobin: 16.7 g/dL — ABNORMAL HIGH (ref 12.0–15.0)
Lymphocytes Relative: 18 % (ref 12–46)
MCHC: 36.1 g/dL — ABNORMAL HIGH (ref 30.0–36.0)
Monocytes Absolute: 0.7 10*3/uL (ref 0.1–1.0)
Monocytes Relative: 5 % (ref 3–12)
Neutro Abs: 11.4 10*3/uL — ABNORMAL HIGH (ref 1.7–7.7)
Neutrophils Relative %: 76 % (ref 43–77)
WBC: 15 10*3/uL — ABNORMAL HIGH (ref 4.0–10.5)

## 2013-05-20 LAB — COMPREHENSIVE METABOLIC PANEL
AST: 21 U/L (ref 0–37)
Albumin: 4.3 g/dL (ref 3.5–5.2)
Alkaline Phosphatase: 110 U/L (ref 39–117)
BUN: 7 mg/dL (ref 6–23)
CO2: 24 mEq/L (ref 19–32)
Chloride: 98 mEq/L (ref 96–112)
GFR calc non Af Amer: >90 mL/min (ref >90)
Potassium: 4 mEq/L (ref 3.5–5.1)
Total Bilirubin: 0.5 mg/dL (ref 0.3–1.2)

## 2013-05-20 LAB — URINE MICROSCOPIC-ADD ON

## 2013-05-20 LAB — URINALYSIS, ROUTINE W REFLEX MICROSCOPIC
Bilirubin Urine: NEGATIVE
Glucose, UA: >1000 mg/dL — AB
Ketones, ur: 40 mg/dL — AB
Protein, ur: >300 mg/dL — AB
pH: 6.5 (ref 5.0–8.0)

## 2013-05-20 MED ORDER — INFLUENZA VAC SPLIT QUAD 0.5 ML IM SUSP
0.5000 mL | INTRAMUSCULAR | Status: AC
  Administered 2013-05-21: 0.5 mL via INTRAMUSCULAR
  Filled 2013-05-20: qty 0.5

## 2013-05-20 MED ORDER — SODIUM CHLORIDE 0.9 % IV SOLN
INTRAVENOUS | Status: DC
  Administered 2013-05-20 (×2): via INTRAVENOUS

## 2013-05-20 MED ORDER — OLANZAPINE 10 MG PO TABS: 10.0000 mg | ORAL_TABLET | Freq: Every day | ORAL | Status: DC

## 2013-05-20 MED ORDER — HYDROMORPHONE HCL PF 1 MG/ML IJ SOLN
1.0000 mg | Freq: Once | INTRAMUSCULAR | Status: AC
  Administered 2013-05-20: 1 mg via INTRAVENOUS
  Filled 2013-05-20: qty 1

## 2013-05-20 MED ORDER — IOHEXOL 300 MG/ML  SOLN
25.0000 mL | Freq: Once | INTRAMUSCULAR | Status: AC | PRN
  Administered 2013-05-20: 25 mL via ORAL

## 2013-05-20 MED ORDER — PANTOPRAZOLE SODIUM 40 MG IV SOLR
40.0000 mg | Freq: Every day | INTRAVENOUS | Status: DC
  Administered 2013-05-20 – 2013-05-22 (×3): 40 mg via INTRAVENOUS
  Filled 2013-05-20 (×4): qty 40

## 2013-05-20 MED ORDER — SODIUM CHLORIDE 0.9 % IV SOLN
INTRAVENOUS | Status: DC
  Administered 2013-05-20 – 2013-05-22 (×4): via INTRAVENOUS

## 2013-05-20 MED ORDER — MORPHINE SULFATE 2 MG/ML IJ SOLN
2.0000 mg | INTRAMUSCULAR | Status: DC | PRN
  Administered 2013-05-21 (×4): 2 mg via INTRAVENOUS
  Filled 2013-05-20 (×4): qty 1

## 2013-05-20 MED ORDER — PNEUMOCOCCAL VAC POLYVALENT 25 MCG/0.5ML IJ INJ
0.5000 mL | INJECTION | INTRAMUSCULAR | Status: AC
  Administered 2013-05-21: 0.5 mL via INTRAMUSCULAR
  Filled 2013-05-20: qty 0.5

## 2013-05-20 MED ORDER — IOHEXOL 300 MG/ML  SOLN
100.0000 mL | Freq: Once | INTRAMUSCULAR | Status: AC | PRN
  Administered 2013-05-20: 100 mL via INTRAVENOUS

## 2013-05-20 MED ORDER — ONDANSETRON HCL 4 MG/2ML IJ SOLN
4.0000 mg | Freq: Once | INTRAMUSCULAR | Status: AC
  Administered 2013-05-20: 4 mg via INTRAVENOUS
  Filled 2013-05-20: qty 2

## 2013-05-20 MED ORDER — LURASIDONE HCL 20 MG PO TABS
20.0000 mg | ORAL_TABLET | Freq: Every day | ORAL | Status: DC
  Administered 2013-05-21 – 2013-05-23 (×3): 20 mg via ORAL
  Filled 2013-05-20 (×3): qty 1

## 2013-05-20 MED ORDER — TRAZODONE HCL 100 MG PO TABS: 200.0000 mg | ORAL_TABLET | Freq: Every day | ORAL | Status: DC

## 2013-05-20 MED ORDER — ONDANSETRON HCL 4 MG/2ML IJ SOLN
4.0000 mg | Freq: Four times a day (QID) | INTRAMUSCULAR | Status: DC | PRN
  Administered 2013-05-21: 4 mg via INTRAVENOUS
  Filled 2013-05-20: qty 2

## 2013-05-20 MED ORDER — TRAZODONE HCL 100 MG PO TABS
200.0000 mg | ORAL_TABLET | Freq: Every day | ORAL | Status: DC
  Administered 2013-05-21 – 2013-05-22 (×3): 200 mg via ORAL
  Filled 2013-05-20 (×4): qty 2

## 2013-05-20 MED ORDER — ONDANSETRON HCL 4 MG PO TABS: 4.0000 mg | ORAL_TABLET | Freq: Four times a day (QID) | ORAL | Status: DC | PRN

## 2013-05-20 MED ORDER — ENOXAPARIN SODIUM 40 MG/0.4ML ~~LOC~~ SOLN
40.0000 mg | SUBCUTANEOUS | Status: DC
  Administered 2013-05-21 – 2013-05-23 (×3): 40 mg via SUBCUTANEOUS
  Filled 2013-05-20 (×3): qty 0.4

## 2013-05-20 MED ORDER — OLANZAPINE 10 MG PO TABS
10.0000 mg | ORAL_TABLET | Freq: Every day | ORAL | Status: DC
  Administered 2013-05-21 – 2013-05-22 (×3): 10 mg via ORAL
  Filled 2013-05-20 (×4): qty 1

## 2013-05-20 MED ORDER — SODIUM CHLORIDE 0.9 % IV BOLUS (SEPSIS)
1000.0000 mL | Freq: Once | INTRAVENOUS | Status: AC
  Administered 2013-05-20: 1000 mL via INTRAVENOUS

## 2013-05-20 NOTE — ED Notes (Signed)
Pt c/o mid ABD pain and vomiting x 12hrs that she verbalizes is similar to previous episodes of pancreatitis post cholecystectomy

## 2013-05-20 NOTE — ED Provider Notes (Signed)
CSN: [id number]     Arrival date & time 05/20/13  1734 History   First MD Initiated Contact with Patient 05/20/13 1816     Chief Complaint  Patient presents with  . Abdominal Pain   (Consider location/radiation/quality/duration/timing/severity/associated sxs/prior Treatment) Patient is a 32 y.o. female presenting with abdominal pain. The history is provided by the patient.  Abdominal Pain  patient complains of midepigastric sharp pain x24 hours. History of pancreatitis and this is similar. Describes nonbilious nonbloody vomiting without associated black or bloody stools. No urinary symptoms. Fever at home up to 101.2. She does have a history of pancreatitis and this is similar. No medications used prior to arrival and nothing makes her symptoms better or worse  Past Medical History  Diagnosis Date  . Pancreatitis   . Bipolar 1 disorder   . Diabetes mellitus     gestational diabeties   Past Surgical History  Procedure Laterality Date  . Cholecystectomy    . Eus  08/02/2011    Procedure: UPPER ENDOSCOPIC ULTRASOUND (EUS) RADIAL;  Surgeon: Freddy Jaksch, MD;  Location: [facility] ENDOSCOPY;  Service: Endoscopy;  Laterality: N/A;  . Fine needle aspiration  08/02/2011    Procedure: FINE NEEDLE ASPIRATION (FNA) RADIAL;  Surgeon: Freddy Jaksch, MD;  Location: [facility] ENDOSCOPY;  Service: Endoscopy;  Laterality: N/A;   History reviewed. No pertinent family history. History  Substance Use Topics  . Smoking status: Current Every Day Smoker -- 0.25 packs/day    Types: Cigarettes  . Smokeless tobacco: Not on file  . Alcohol Use: No   OB History   Grav Para Term Preterm Abortions TAB SAB Ect Mult Living                 Review of Systems  Gastrointestinal: Positive for abdominal pain.  All other systems reviewed and are negative.    Allergies  Penicillins  Home Medications   Current Outpatient Rx  Name  Route  Sig  Dispense  Refill  . divalproex (DEPAKOTE ER) 500 MG 24 hr tablet    Oral   Take 500 mg by mouth at bedtime.           Marland Kitchen OLANZapine (ZYPREXA) 10 MG tablet   Oral   Take 10 mg by mouth at bedtime.           Marland Kitchen EXPIRED: sertraline (ZOLOFT) 50 MG tablet   Oral   Take 1 tablet (50 mg total) by mouth daily.         Marland Kitchen zolpidem (AMBIEN) 10 MG tablet   Oral   Take 10 mg by mouth at bedtime as needed.          BP 144/104  Pulse 113  Temp(Src) 99.1 F (37.3 C) (Oral)  Resp 20  Wt 151 lb 7 oz (68.692 kg)  SpO2 99% Physical Exam  Nursing note and vitals reviewed. Constitutional: She is oriented to person, place, and time. She appears well-developed and well-nourished.  Non-toxic appearance. No distress.  HENT:  Head: Normocephalic and atraumatic.  Eyes: Conjunctivae, EOM and lids are normal. Pupils are equal, round, and reactive to light.  Neck: Normal range of motion. Neck supple. No tracheal deviation present. No mass present.  Cardiovascular: Regular rhythm and normal heart sounds.  Tachycardia present.  Exam reveals no gallop.   No murmur heard. Pulmonary/Chest: Effort normal and breath sounds normal. No stridor. No respiratory distress. She has no decreased breath sounds. She has no wheezes. She has no rhonchi. She has  no rales.  Abdominal: Soft. Normal appearance and bowel sounds are normal. She exhibits no distension. There is tenderness in the epigastric area. There is no rigidity, no rebound, no guarding and no CVA tenderness.  Musculoskeletal: Normal range of motion. She exhibits no edema and no tenderness.  Neurological: She is alert and oriented to person, place, and time. She has normal strength. No cranial nerve deficit or sensory deficit. GCS eye subscore is 4. GCS verbal subscore is 5. GCS motor subscore is 6.  Skin: Skin is warm and dry. No abrasion and no rash noted.  Psychiatric: She has a normal mood and affect. Her speech is normal and behavior is normal.    ED Course  Procedures (including critical care time) Labs  Review Labs Reviewed  CBC WITH DIFFERENTIAL - Abnormal; Notable for the following:    WBC 15.0 (*)    RBC 5.50 (*)    Hemoglobin 16.7 (*)    HCT 46.2 (*)    MCHC 36.1 (*)    Neutro Abs 11.4 (*)    All other components within normal limits  COMPREHENSIVE METABOLIC PANEL  LIPASE, BLOOD  URINALYSIS, ROUTINE W REFLEX MICROSCOPIC   Imaging Review No results found.  EKG Interpretation   None       MDM  No diagnosis found. Patient given IV fluids here as well as anti-medics and opiates. Her pain remains and will be admission for treatment of her pancreatitis.   Toy Baker, MD 05/20/13 2221

## 2013-05-20 NOTE — ED Notes (Signed)
Pt reports mid abd pain since yesterday, having n/v, no bowel movement in over 24 hours. Hx of pancreatitis. No acute distress noted at triage.

## 2013-05-20 NOTE — ED Notes (Signed)
CT waiting on results of pregnancy test.

## 2013-05-20 NOTE — H&P (Signed)
[facility] History and Physical  Patient: [name]  ZOX:[id number]  DOB: [birth year]  DOS: the patient was seen and examined on 05/20/2013 PCP: No primary provider on file.  Chief Complaint: Abdominal pain nausea and vomiting  HPI: [name] is a 32 y.o. female with Past medical history of pancreatitis, cholecystectomy. The patient is coming from home. The patient presents with the complaint of abdominal pain that has been ongoing since yesterday. The pain was located in the upper abdomen region and was felt like a sharp pain. The patient mentions that she has chronic dull abdominal pain in the same region which also that worsen over last 2 days. This morning when she woke up she had fever around 101.2 and throughout the day she continues to have nausea with 2 episodes of vomiting without any blood. She denies any diarrhea or constipation and has been passing gas. She also denies any significant medication change.  Review of Systems: as mentioned in the history of present illness.  A Comprehensive review of the other systems is negative.  Past Medical History  Diagnosis Date  . Pancreatitis   . Bipolar 1 disorder   . Diabetes mellitus     gestational diabeties   Past Surgical History  Procedure Laterality Date  . Cholecystectomy    . Eus  08/02/2011    Procedure: UPPER ENDOSCOPIC ULTRASOUND (EUS) RADIAL;  Surgeon: Freddy Jaksch, MD;  Location: [facility];  Service: Endoscopy;  Laterality: N/A;  . Fine needle aspiration  08/02/2011    Procedure: FINE NEEDLE ASPIRATION (FNA) RADIAL;  Surgeon: Freddy Jaksch, MD;  Location: [facility];  Service: Endoscopy;  Laterality: N/A;   Social History:  reports that she has been smoking Cigarettes.  She has been smoking about 0.25 packs per day. She does not have any smokeless tobacco history on file. She reports that she does not drink alcohol or use illicit drugs. Independent for most of her  ADL.  Allergies   Allergen Reactions  . Penicillins Anaphylaxis    History reviewed. No pertinent family history.  Prior to Admission medications   Medication Sig Start Date End Date Taking? Authorizing Provider  aspirin-sod bicarb-citric acid (ALKA-SELTZER) 325 MG TBEF tablet Take 325 mg by mouth every 6 (six) hours as needed (for cold symtpoms).    Yes Historical Provider, MD  ibuprofen (ADVIL,MOTRIN) 200 MG tablet Take 400 mg by mouth every 6 (six) hours as needed.   Yes Historical Provider, MD  Lurasidone HCl (LATUDA) 20 MG TABS Take 20 mg by mouth daily.   Yes Historical Provider, MD  OLANZapine (ZYPREXA) 10 MG tablet Take 10 mg by mouth at bedtime.     Yes Historical Provider, MD  traZODone (DESYREL) 100 MG tablet Take 200 mg by mouth at bedtime.   Yes Historical Provider, MD    Physical Exam: Filed Vitals:   05/20/13 1740 05/20/13 1921 05/20/13 1930 05/20/13 2015  BP: 144/104 136/81 136/91 126/82  Pulse: 113 105 93 103  Temp: 99.1 F (37.3 C)     TempSrc: Oral     Resp: 20 18    Weight: 68.692 kg (151 lb 7 oz)     SpO2: 99% 98% 93% 92%    General: Alert, Awake and Oriented to Time, Place and Person. Appear in mild distress Eyes: PERRL ENT: Oral Mucosa clear moist. Neck: no JVD Cardiovascular: S1 and S2 Present, no Murmur, Peripheral Pulses Present Respiratory: Bilateral Air entry equal and Decreased, Clear to Auscultation,  no  Crackles,no wheezes Abdomen: Bowel Sound Present, Soft and diffusely tender, no guarding no rigidity  Skin: no Rash Extremities: no Pedal edema, no calf tenderness Neurologic: Grossly Unremarkable.  Labs on Admission:  CBC:  Recent Labs Lab 05/20/13 1744  WBC 15.0*  NEUTROABS 11.4*  HGB 16.7*  HCT 46.2*  MCV 84.0  PLT 256    CMP     Component Value Date/Time   NA 138 05/20/2013 1744   K 4.0 05/20/2013 1744   CL 98 05/20/2013 1744   CO2 24 05/20/2013 1744   GLUCOSE 299* 05/20/2013 1744   BUN 7 05/20/2013 1744   CREATININE 0.54 05/20/2013  1744   CALCIUM 10.2 05/20/2013 1744   PROT 8.4* 05/20/2013 1744   ALBUMIN 4.3 05/20/2013 1744   AST 21 05/20/2013 1744   ALT 39* 05/20/2013 1744   ALKPHOS 110 05/20/2013 1744   BILITOT 0.5 05/20/2013 1744   GFRNONAA >90 05/20/2013 1744   GFRAA >90 05/20/2013 1744     Recent Labs Lab 05/20/13 1744  LIPASE 253*   No results found for this basename: AMMONIA,  in the last 168 hours  No results found for this basename: CKTOTAL, CKMB, CKMBINDEX, TROPONINI,  in the last 168 hours BNP (last 3 results) No results found for this basename: PROBNP,  in the last 8760 hours  Radiological Exams on Admission: Ct Abdomen Pelvis W Contrast  05/20/2013   CLINICAL DATA:  Upper abdominal pain since yesterday. History of pancreatitis previously. Pancreatic pseudocyst on prior imaging.  EXAM: CT ABDOMEN AND PELVIS WITH CONTRAST  TECHNIQUE: Multidetector CT imaging of the abdomen and pelvis was performed using the standard protocol following bolus administration of intravenous contrast.  CONTRAST:  OMNIPAQUE IOHEXOL 300 MG/ML  SOLN  COMPARISON:  06/23/2011  FINDINGS: Subpleural dependent atelectasis is noted at both lung bases.  Hepatic hypodensity suggesting steatosis reidentified. 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 There is focal wall thickening of the duodenal bulb and 2nd portion of the duodenum, with surrounding stranding and prominent porta hepatis nodes measuring 8 mm maximally image 36. Trace fluid is identified in this region and adjacent to the pancreatic head. There is focal pancreatic ductal dilatation at its mid portion measuring 8 mm image 31, with truncation of the pancreatic tail possibly due to partial pancreatectomy core atrophy. No measurable peripancreatic or. Duodenal fluid collection is identified.  Hepatic hypodensity suggests steatosis. Spleen, adrenal glands are unremarkable. 2 mm nonobstructing left lower renal pole calculus image  47. Stable 8 mm right lower renal pole cortical hypodense lesion which is too small for accurate measurements but is unchanged and most likely represents a cyst.  No free air. No colonic or small bowel wall thickening. Normal appendix. IUD in place within the otherwise normal-appearing uterus. Ovaries are normal. No acute osseous finding.  IMPRESSION: Upper abdominal stranding and fluid centered most around the duodenum bulb and 2nd portion of the duodenum, which could represent peptic ulcer disease or less likely neoplasm. There are inflammatory changes around the head of the pancreas and pancreatitis with secondary duodenal involvement could appear similar. No focal fluid collection to suggest abscess or pseudocyst formation at this time.   Electronically Signed   By: Christiana Pellant M.D.   On: 05/20/2013 20:58   Assessment/Plan Principal Problem:   Pancreatitis Active Problems:   Duodenitis   1. Pancreatitis  the patient is presenting with abdominal pain nausea and vomiting she has elevated lipase level and a CT scan done  in the ED is suggestive of mild pancreatitis. With which she will be admitted to hospital med surge floor. She will be kept n.p.o. for bowel rest. IV fluids will be given. IV morphine for pain control and IV Zofran for nausea control. IV Protonix will be given.  2. inflammation of the duodenum The patient has upper abdominal stranding around the duodenum. She denies any weight loss or lymphadenopathy. She has undergone an EUS last year. Which has not found any significant abnormality in the duodenum. At present I will keep her n.p.o. for bowel rest, place her on IV Protonix. She may require GI followup after discharge for further workup of current finding.  3. bipolar disorder Continue current treatment   DVT Prophylaxis: subcutaneous Heparin Nutrition: N.p.o.  Code Status: Full  Family Communication: Husband was present at bedside, opportunity was given to ask  question and all questions were answered satisfactorily at the time of interview. Disposition: Admitted to inpatient in med-surge unit.  Author: Lynden Oxford, MD [facility] Pager: [phone] 05/20/2013, 11:12 PM    If 7PM-7AM, please contact night-coverage [email or web address] Password [email or web address]

## 2013-05-20 NOTE — ED Notes (Signed)
Admitting physician at bedside

## 2013-05-21 LAB — COMPREHENSIVE METABOLIC PANEL
ALT: 28 U/L (ref 0–35)
AST: 15 U/L (ref 0–37)
Alkaline Phosphatase: 90 U/L (ref 39–117)
BUN: 5 mg/dL — ABNORMAL LOW (ref 6–23)
CO2: 23 mEq/L (ref 19–32)
Calcium: 8.6 mg/dL (ref 8.4–10.5)
Chloride: 105 mEq/L (ref 96–112)
GFR calc Af Amer: >90 mL/min (ref >90)
GFR calc non Af Amer: >90 mL/min (ref >90)
Glucose, Bld: 202 mg/dL — ABNORMAL HIGH (ref 70–99)
Potassium: 3.9 mEq/L (ref 3.5–5.1)
Sodium: 139 mEq/L (ref 135–145)

## 2013-05-21 LAB — CBC
MCHC: 35.6 g/dL (ref 30.0–36.0)
RDW: 12.8 % (ref 11.5–15.5)
WBC: 10.4 10*3/uL (ref 4.0–10.5)

## 2013-05-21 LAB — PROTIME-INR
INR: 1.03 (ref 0.00–1.49)
Prothrombin Time: 13.3 seconds (ref 11.6–15.2)

## 2013-05-21 MED ORDER — FLUTICASONE PROPIONATE 50 MCG/ACT NA SUSP
1.0000 | Freq: Every day | NASAL | Status: DC
  Administered 2013-05-21 – 2013-05-23 (×2): 1 via NASAL
  Filled 2013-05-21 (×2): qty 16

## 2013-05-21 MED ORDER — BENZONATATE 100 MG PO CAPS
100.0000 mg | ORAL_CAPSULE | Freq: Two times a day (BID) | ORAL | Status: DC | PRN
  Administered 2013-05-21: 100 mg via ORAL
  Filled 2013-05-21: qty 1

## 2013-05-21 MED ORDER — MENTHOL 3 MG MT LOZG
1.0000 | LOZENGE | OROMUCOSAL | Status: DC | PRN
  Administered 2013-05-21: 3 mg via ORAL
  Filled 2013-05-21: qty 9

## 2013-05-21 MED ORDER — GUAIFENESIN ER 600 MG PO TB12
600.0000 mg | ORAL_TABLET | Freq: Two times a day (BID) | ORAL | Status: DC | PRN
  Administered 2013-05-21: 600 mg via ORAL
  Filled 2013-05-21 (×2): qty 1

## 2013-05-21 MED ORDER — OXYCODONE-ACETAMINOPHEN 5-325 MG PO TABS
1.0000 | ORAL_TABLET | Freq: Four times a day (QID) | ORAL | Status: DC | PRN
  Administered 2013-05-21: 2 via ORAL
  Filled 2013-05-21: qty 2

## 2013-05-21 NOTE — Progress Notes (Signed)
Utilization review completed.  

## 2013-05-21 NOTE — Progress Notes (Signed)
[facility] PROGRESS NOTE      [name] UJW:[id number] DOB: 11-19-1980 DOA: 05/20/2013 PCP: No primary provider on file.  HPI: 32 y.o. female with Past medical history of pancreatitis, cholecystectomy. The patient is coming from home. The patient presents with the complaint of abdominal pain that has been ongoing since yesterday. The pain was located in the upper abdomen region and was felt like a sharp pain. The patient mentions that she has chronic dull abdominal pain in the same region which also that worsen over last 2 days. This morning when she woke up she had fever around 101.2 and throughout the day she continues to have nausea with 2 episodes of vomiting without any blood. She denies any diarrhea or constipation and has been passing gas. She also denies any significant medication change.  Assessment/Plan: Acute pancreatitis - she is regularly followed by Dr. Dulce Sellar. Sharon episode of acute pancreatitis 2 years ago, comfortably by pseudocyst formation, she underwent an EUS with a pseudocyst drainage. CT scan during this admission was only pertinent for pancreatic inflammation. She is fairly better this morning, pain seems to be under control with her current pain regimen. She still complains of nausea, and will continue n.p.o. for today and consider advancing to clear liquids tomorrow. Consider GI consult if her symptoms do not improve. As far as etiology for her pancreatitis, and about a procedure at this point. There's been no new medications for her, and she does not drink alcohol. Bipolar disorder - continue home medications Cough - she has been having "chest congestion" for the past week. Symptomatic treatment for now. Leukocytosis - likely due to #1. Resolved. Hyperglycemia - will check HBA1C  Diet: N.p.o. Fluids: Normal saline DVT Prophylaxis: Lovenox  Code Status: Full code Family Communication: None  Disposition Plan: Home when medically  ready  Consultants:  None  Procedures:  None   Antibiotics  Anti-infectives   None     Antibiotics Given (last 72 hours)   None      HPI/Subjective: -  Endorses abdominal tenderness with good control and pain medications, mild nausea.  Objective: Filed Vitals:   05/20/13 1930 05/20/13 2015 05/20/13 2334 05/21/13 0433  BP: 136/91 126/82 138/102 107/73  Pulse: 93 103 116 90  Temp:   98.8 F (37.1 C) 98.7 F (37.1 C)  TempSrc:   Oral Oral  Resp:   18 17  Height:   5\' 3"  (1.6 m)   Weight:   69.582 kg (153 lb 6.4 oz)   SpO2: 93% 92% 97% 93%    Intake/Output Summary (Last 24 hours) at 05/21/13 0937 Last data filed at 05/21/13 0658  Gross per 24 hour  Intake 716.67 ml  Output      0 ml  Net 716.67 ml   Filed Weights   05/20/13 1740 05/20/13 2334  Weight: 68.692 kg (151 lb 7 oz) 69.582 kg (153 lb 6.4 oz)    Exam:   General:  NAD  Cardiovascular: regular rate and rhythm, without MRG  Respiratory: good air movement, clear to auscultation throughout, no wheezing, ronchi or rales  Abdomen: tender to palpation throughout, mostly in the epigastric area. No rebound tenderness. No guarding.   MSK: no peripheral edema  Neuro: nonfocal   Data Reviewed: Basic Metabolic Panel:  Recent Labs Lab 05/20/13 1744 05/21/13 0600  NA 138 139  K 4.0 3.9  CL 98 105  CO2 24 23  GLUCOSE 299* 202*  BUN 7 5*  CREATININE 0.54 0.49*  CALCIUM  10.2 8.6   Liver Function Tests:  Recent Labs Lab 05/20/13 1744 05/21/13 0600  AST 21 15  ALT 39* 28  ALKPHOS 110 90  BILITOT 0.5 0.5  PROT 8.4* 6.8  ALBUMIN 4.3 3.4*    Recent Labs Lab 05/20/13 1744  LIPASE 253*   CBC:  Recent Labs Lab 05/20/13 1744 05/21/13 0600  WBC 15.0* 10.4  NEUTROABS 11.4*  --   HGB 16.7* 13.7  HCT 46.2* 38.5  MCV 84.0 87.1  PLT 256 184   Studies: Ct Abdomen Pelvis W Contrast  05/20/2013   CLINICAL DATA:  Upper abdominal pain since yesterday. History of pancreatitis  previously. Pancreatic pseudocyst on prior imaging.  EXAM: CT ABDOMEN AND PELVIS WITH CONTRAST  TECHNIQUE: Multidetector CT imaging of the abdomen and pelvis was performed using the standard protocol following bolus administration of intravenous contrast.  CONTRAST:  OMNIPAQUE IOHEXOL 300 MG/ML  SOLN  COMPARISON:  06/23/2011  FINDINGS: Subpleural dependent atelectasis is noted at both lung bases.  Hepatic hypodensity suggesting steatosis reidentified. 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 There is focal wall thickening of the duodenal bulb and 2nd portion of the duodenum, with surrounding stranding and prominent porta hepatis nodes measuring 8 mm maximally image 36. Trace fluid is identified in this region and adjacent to the pancreatic head. There is focal pancreatic ductal dilatation at its mid portion measuring 8 mm image 31, with truncation of the pancreatic tail possibly due to partial pancreatectomy core atrophy. No measurable peripancreatic or. Duodenal fluid collection is identified.  Hepatic hypodensity suggests steatosis. Spleen, adrenal glands are unremarkable. 2 mm nonobstructing left lower renal pole calculus image 47. Stable 8 mm right lower renal pole cortical hypodense lesion which is too small for accurate measurements but is unchanged and most likely represents a cyst.  No free air. No colonic or small bowel wall thickening. Normal appendix. IUD in place within the otherwise normal-appearing uterus. Ovaries are normal. No acute osseous finding.  IMPRESSION: Upper abdominal stranding and fluid centered most around the duodenum bulb and 2nd portion of the duodenum, which could represent peptic ulcer disease or less likely neoplasm. There are inflammatory changes around the head of the pancreas and pancreatitis with secondary duodenal involvement could appear similar. No focal fluid collection to suggest abscess or pseudocyst formation at this time.    Electronically Signed   By: Christiana Pellant M.D.   On: 05/20/2013 20:58    Scheduled Meds: . enoxaparin (LOVENOX) injection  40 mg Subcutaneous Q24H  . influenza vac split quadrivalent PF  0.5 mL Intramuscular Tomorrow-1000  . Lurasidone HCl  20 mg Oral Daily  . OLANZapine  10 mg Oral QHS  . pantoprazole (PROTONIX) IV  40 mg Intravenous QHS  . pneumococcal 23 valent vaccine  0.5 mL Intramuscular Tomorrow-1000  . traZODone  200 mg Oral QHS   Continuous Infusions: . sodium chloride 100 mL/hr at 05/21/13 1324   Principal Problem:   Pancreatitis Active Problems:   Duodenitis  Time spent: 29  Pamella Pert, MD [facility] Pager [phone]. If 7 PM - 7 AM, please contact night-coverage at [email or web address], password [facility] 05/21/2013, 9:37 AM  LOS: 1 day

## 2013-05-21 NOTE — Progress Notes (Signed)
Inpatient Diabetes Program Recommendations  AACE/ADA: New Consensus Statement on Inpatient Glycemic Control (2013)  Target Ranges:  Prepandial:   less than 140 mg/dL      Peak postprandial:   less than 180 mg/dL (1-2 hours)      Critically ill patients:  140 - 180 mg/dL   Reason for Visit: Results for JAKALA, HERFORD (MRN [id number]) as of 05/21/2013 10:44  Ref. Range 05/20/2013 17:44 05/21/2013 06:00  Glucose Latest Range: 70-99 mg/dL 191 (H) 478 (H)   Note elevated glucose levels.  Patient admitted with pancreatitis. May consider adding Novolog sensitive correction q 4 hours.  Also please check A1C to determine pre-hospitalization glycemic control. Will follow.  Beryl Meager, RN, BC-ADM Inpatient Diabetes Coordinator Pager [phone]

## 2013-05-21 NOTE — Progress Notes (Signed)
05/21/13 00:05 Received patient from ED,admitted for abd. pain,N/V.Patient is alert and oriented x4.Oriented to room and unit routines.Patient refused to view safety view,per patient,she had seen it already.Patient advised for any assistance.Call bell within reach. Toluwanimi Radebaugh Joselita,RN

## 2013-05-22 ENCOUNTER — Inpatient Hospital Stay (HOSPITAL_COMMUNITY): Payer: BC Managed Care – PPO

## 2013-05-22 LAB — GLUCOSE, CAPILLARY
Glucose-Capillary: 130 mg/dL — ABNORMAL HIGH (ref 70–99)
Glucose-Capillary: 179 mg/dL — ABNORMAL HIGH (ref 70–99)
Glucose-Capillary: 229 mg/dL — ABNORMAL HIGH (ref 70–99)

## 2013-05-22 LAB — LIPID PANEL
Cholesterol: 198 mg/dL (ref 0–200)
HDL: 21 mg/dL — ABNORMAL LOW (ref >39)
LDL Cholesterol: 119 mg/dL — ABNORMAL HIGH (ref 0–99)
Total CHOL/HDL Ratio: 9.4 ratio
Triglycerides: 288 mg/dL — ABNORMAL HIGH (ref <150)
VLDL: 58 mg/dL — ABNORMAL HIGH (ref 0–40)

## 2013-05-22 MED ORDER — INSULIN ASPART 100 UNIT/ML ~~LOC~~ SOLN
0.0000 [IU] | Freq: Three times a day (TID) | SUBCUTANEOUS | Status: DC
  Administered 2013-05-22 – 2013-05-23 (×4): 2 [IU] via SUBCUTANEOUS

## 2013-05-22 MED ORDER — LIVING WELL WITH DIABETES BOOK
Freq: Once | Status: AC
  Administered 2013-05-22: 11:00:00
  Filled 2013-05-22: qty 1

## 2013-05-22 MED ORDER — AZITHROMYCIN 500 MG PO TABS
500.0000 mg | ORAL_TABLET | Freq: Every day | ORAL | Status: DC
  Administered 2013-05-22 – 2013-05-23 (×2): 500 mg via ORAL
  Filled 2013-05-22 (×2): qty 1

## 2013-05-22 NOTE — Progress Notes (Signed)
05/22/2013 1400 Provided pt with free Glucometer with 10 test strips and lancets from [facility]. Pt's insurance s currently not effective. Will become effective on 06/02/2013. Will do MATCH for pt to receive meds at $3.00 copay price. Waiting final dc orders/RX. Isidoro Donning RN CCM Case Mgmt phone [phone]

## 2013-05-22 NOTE — Progress Notes (Signed)
Inpatient Diabetes Program Recommendations  AACE/ADA: New Consensus Statement on Inpatient Glycemic Control (2013)  Target Ranges:  Prepandial:   less than 140 mg/dL      Peak postprandial:   less than 180 mg/dL (1-2 hours)      Critically ill patients:  140 - 180 mg/dL     Results for [name], [name] (MRN [id number]) as of 05/22/2013 14:17  Ref. Range 05/21/2013 17:01  Hemoglobin A1C Latest Range: <5.7 % 9.5 (H)    **Patient admitted with pancreatitis.  Improved vastly since admission.  Now tolerating PO carbohydrate modified diet.  Diagnosed with diabetes this admission with A1c of 9.5% (05/21/13).    **Spoke with pt about new diagnosis.  Discussed A1C results with her and explained what an A1C is, basic pathophysiology of DM Type 2, basic home care, importance of checking CBGs and maintaining good CBG control to prevent long-term and short-term complications.  Reviewed signs and symptoms of hyperglycemia and hypoglycemia.  Also reviewed basic DM diet information including basic carbohydrate counting and how to read food labels.   RNs to provide ongoing basic DM education at bedside with this patient.  Have ordered educational booklet and DM videos.  Also ordered RD consult for DM diet education.  **When speaking with patient, patient told me she had gestational DM with her last pregnancy and had to take insulin during that pregnancy.  Patient was tearful during our conversation and told me she had to watch her mother die from DM and that she was scared she would die the same way her mother did.  Patient told me she did not want to her 4 children to have to watch her die from DM.  Patient also expressed to me that she has severe financial difficulties and is behind on her rent at home.  Patient stated she gets [facility] assistance for food, however, she can barely afford the co-pays for her Latuda medication for her bipolar diagnosis.  Patient stated she may be able to afford $4 medications at  [facility], however, she also told me she could probably not afford $25 for insulin at [facility].  Patient expressed to me that she doesn't mind taking medications, however, she needs to be able to afford them.    **Gave patient information on purchasing an inexpensive CBG meter and strips at [employer] [facility].  Meter is $16 and a box of 50 strips is $9.  Patient has a CBG meter at home that she used during her last pregnancy, however, she is unsure how much the co-pay on the strips will be with her insurance.  I encouraged patient to purchase the [employer] CBG meter asap after d/c so she can record her CBGs.  Patient told me she plans to follow-up at the [facility] after d/c b/c she cannot afford the co-pays at her regular PCP's office (her co-pays are $40 per visit and she already owes the MD $90).   **MD- Please keep cost consideration in mind when giving patient prescriptions for DM medications at d/c.    -Would patient be a candidate for Metformin despite her issues with pancreatitis?  Metformin can be purchased for $4 at [facility].    -Also, would patient be a candidate for the new SGLT-2 receptor inhibitor medication called Farxiga (dapagliflozin)?  We do have a coupon we could give to patient that would allow her to get the Farxiga for free as long as she has a valid Rx.  Starting dose of  Marcelline Deist is 5mg  once daily in the AM.  Please call DM Coordinator to discuss Marcelline Deist coupon if desired.  Will be glad to give patient the coupon if prescribed.    Will follow. Ambrose Finland RN, MSN, CDE Diabetes Coordinator Inpatient Diabetes Program Team Pager: [phone] (8a-10p)

## 2013-05-22 NOTE — Progress Notes (Signed)
[facility] PROGRESS NOTE      [name] ION:[id number] DOB: 05/30/1981 DOA: 05/20/2013 PCP: No primary provider on file.  HPI: 32 y.o. female with Past medical history of pancreatitis, cholecystectomy. The patient is coming from home. The patient presents with the complaint of abdominal pain that has been ongoing since yesterday. The pain was located in the upper abdomen region and was felt like a sharp pain. The patient mentions that she has chronic dull abdominal pain in the same region which also that worsen over last 2 days. This morning when she woke up she had fever around 101.2 and throughout the day she continues to have nausea with 2 episodes of vomiting without any blood. She denies any diarrhea or constipation and has been passing gas. She also denies any significant medication change.  Assessment/Plan: Acute pancreatitis - she is regularly followed by Dr. Dulce Sellar. She had episode of acute pancreatitis 2 years ago, complicated by pseudocyst formation, she underwent an EUS with a pseudocyst drainage. CT scan during this admission was only pertinent for pancreatic inflammation. She is fairly better this morning, pain seems to be under control with her current pain regimen.  - feeling much better in terms of her abdominal pain. Only on oral pain medications. Will advance diet to clears in am and to full then fat free if she tolerates. Bipolar disorder - continue home medications Cough - she has been having "chest congestion" for the past week. - cough more productive this morning and visibly worse. Start Azithromycin for bronchitis. Will obtain 2 view CXR. Leukocytosis - likely due to #1. Resolved. Type 2 DM - HBA1C 9.5 which is diagnostic of DM. - start SSI. Consult diabetes educator.  Diet: N.p.o. Fluids: Normal saline DVT Prophylaxis: Lovenox  Code Status: Full code Family Communication: None  Disposition Plan: Home when medically  ready  Consultants:  None  Procedures:  None   Antibiotics  Anti-infectives   Start     Dose/Rate Route Frequency Ordered Stop   05/22/13 1000  azithromycin (ZITHROMAX) tablet 500 mg     500 mg Oral Daily 05/22/13 0808       Antibiotics Given (last 72 hours)   None      HPI/Subjective: - abdominal pain much improved this morning, has not required pain medications since last night.   Objective: Filed Vitals:   05/21/13 1402 05/21/13 1700 05/21/13 2147 05/22/13 0540  BP: 112/60 126/74 106/75 106/68  Pulse: 82 80 76 81  Temp: 98.6 F (37 C) 98.4 F (36.9 C) 98.7 F (37.1 C) 99.2 F (37.3 C)  TempSrc: Oral Oral Oral Oral  Resp: 18 18 21 24   Height:   5\' 3"  (1.6 m)   Weight:   69.446 kg (153 lb 1.6 oz)   SpO2: 97% 98% 93% 96%    Intake/Output Summary (Last 24 hours) at 05/22/13 0819 Last data filed at 05/22/13 0500  Gross per 24 hour  Intake 2141.67 ml  Output      0 ml  Net 2141.67 ml   Filed Weights   05/20/13 1740 05/20/13 2334 05/21/13 2147  Weight: 68.692 kg (151 lb 7 oz) 69.582 kg (153 lb 6.4 oz) 69.446 kg (153 lb 1.6 oz)    Exam:   General:  NAD  Cardiovascular: regular rate and rhythm, without MRG  Respiratory: good air movement, clear to auscultation throughout, no wheezing, ronchi or rales  Abdomen: tender to palpation throughout, mostly in the epigastric area, improved. No rebound tenderness. No guarding.  MSK: no peripheral edema  Neuro: nonfocal   Data Reviewed: Basic Metabolic Panel:  Recent Labs Lab 05/20/13 1744 05/21/13 0600  NA 138 139  K 4.0 3.9  CL 98 105  CO2 24 23  GLUCOSE 299* 202*  BUN 7 5*  CREATININE 0.54 0.49*  CALCIUM 10.2 8.6   Liver Function Tests:  Recent Labs Lab 05/20/13 1744 05/21/13 0600  AST 21 15  ALT 39* 28  ALKPHOS 110 90  BILITOT 0.5 0.5  PROT 8.4* 6.8  ALBUMIN 4.3 3.4*    Recent Labs Lab 05/20/13 1744  LIPASE 253*   CBC:  Recent Labs Lab 05/20/13 1744 05/21/13 0600  WBC  15.0* 10.4  NEUTROABS 11.4*  --   HGB 16.7* 13.7  HCT 46.2* 38.5  MCV 84.0 87.1  PLT 256 184   Studies: Ct Abdomen Pelvis W Contrast  05/20/2013   CLINICAL DATA:  Upper abdominal pain since yesterday. History of pancreatitis previously. Pancreatic pseudocyst on prior imaging.  EXAM: CT ABDOMEN AND PELVIS WITH CONTRAST  TECHNIQUE: Multidetector CT imaging of the abdomen and pelvis was performed using the standard protocol following bolus administration of intravenous contrast.  CONTRAST:  OMNIPAQUE IOHEXOL 300 MG/ML  SOLN  COMPARISON:  06/23/2011  FINDINGS: Subpleural dependent atelectasis is noted at both lung bases.  Hepatic hypodensity suggesting steatosis reidentified. 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 There is focal wall thickening of the duodenal bulb and 2nd portion of the duodenum, with surrounding stranding and prominent porta hepatis nodes measuring 8 mm maximally image 36. Trace fluid is identified in this region and adjacent to the pancreatic head. There is focal pancreatic ductal dilatation at its mid portion measuring 8 mm image 31, with truncation of the pancreatic tail possibly due to partial pancreatectomy core atrophy. No measurable peripancreatic or. Duodenal fluid collection is identified.  Hepatic hypodensity suggests steatosis. Spleen, adrenal glands are unremarkable. 2 mm nonobstructing left lower renal pole calculus image 47. Stable 8 mm right lower renal pole cortical hypodense lesion which is too small for accurate measurements but is unchanged and most likely represents a cyst.  No free air. No colonic or small bowel wall thickening. Normal appendix. IUD in place within the otherwise normal-appearing uterus. Ovaries are normal. No acute osseous finding.  IMPRESSION: Upper abdominal stranding and fluid centered most around the duodenum bulb and 2nd portion of the duodenum, which could represent peptic ulcer disease or less  likely neoplasm. There are inflammatory changes around the head of the pancreas and pancreatitis with secondary duodenal involvement could appear similar. No focal fluid collection to suggest abscess or pseudocyst formation at this time.   Electronically Signed   By: Christiana Pellant M.D.   On: 05/20/2013 20:58    Scheduled Meds: . azithromycin  500 mg Oral Daily  . enoxaparin (LOVENOX) injection  40 mg Subcutaneous Q24H  . fluticasone  1 spray Each Nare Daily  . Lurasidone HCl  20 mg Oral Daily  . OLANZapine  10 mg Oral QHS  . pantoprazole (PROTONIX) IV  40 mg Intravenous QHS  . traZODone  200 mg Oral QHS   Continuous Infusions: . sodium chloride 100 mL/hr at 05/21/13 1926   Principal Problem:   Pancreatitis Active Problems:   Duodenitis  Time spent: 25  Pamella Pert, MD [facility] Pager [phone]. If 7 PM - 7 AM, please contact night-coverage at [email or web address], password [facility] 05/22/2013, 8:19 AM  LOS: 2 days

## 2013-05-22 NOTE — Plan of Care (Signed)
Problem: Food- and Nutrition-Related Knowledge Deficit (NB-1.1) Goal: Nutrition education Formal process to instruct or train a patient/client in a skill or to impart knowledge to help patients/clients voluntarily manage or modify food choices and eating behavior to maintain or improve health.  Outcome: Progressing  RD consulted for nutrition education regarding diabetes.     Lab Results  Component Value Date    HGBA1C 9.5* 05/21/2013    RD provided "Carbohydrate Counting for People with Diabetes" handout from the Academy of [employer] and Dietetics. Discussed different food groups and their effects on blood sugar, emphasizing carbohydrate-containing foods. Provided list of carbohydrates and recommended serving sizes of common foods.  Discussed importance of controlled and consistent carbohydrate intake throughout the day. Provided examples of ways to balance meals/snacks and encouraged intake of high-fiber, whole grain complex carbohydrates. Teach back method used. Pt states that she had gestational diabetes when pregnant and was education on diabetes management at that time.  Pt states she is currently "overwhelmed." Reinforced her health is manageable and can be improved related to diabetes care.   Expect good compliance.  Body mass index is 27.13 kg/(m^2). Pt meets criteria for overweight based on current BMI.  Current diet order is CHO Mod Medium, patient is consuming approximately 80% of meals at this time. Labs and medications reviewed. No further nutrition interventions warranted at this time. RD contact information provided. If additional nutrition issues arise, please re-consult RD.  Loyce Dys, MS RD LDN Clinical Inpatient Dietitian Pager: [phone] Weekend/After hours pager: [phone]

## 2013-05-22 NOTE — Progress Notes (Signed)
05/22/2013 1140 NCM spoke to pt and finances are limited. Spoke to pt and about assistance with [employer] and [employer]. She has not seen Dr. Tenny Craw in quite awhile. Has an outstanding balance. Will provide pt with information on [facility] to arrange follow up appt. Pt states she does not have glucometer to check blood sugar. She gets her Latuda for $25.00 with coupon from manufacturer. And other meds run $10.00 for copay. Isidoro Donning RN CCM Case Mgmt phone [phone]

## 2013-05-23 DIAGNOSIS — E119 Type 2 diabetes mellitus without complications: Secondary | ICD-10-CM

## 2013-05-23 LAB — GLUCOSE, CAPILLARY
Glucose-Capillary: 176 mg/dL — ABNORMAL HIGH (ref 70–99)
Glucose-Capillary: 179 mg/dL — ABNORMAL HIGH (ref 70–99)

## 2013-05-23 MED ORDER — AZITHROMYCIN 500 MG PO TABS: 500.0000 mg | ORAL_TABLET | Freq: Every day | ORAL | Status: DC

## 2013-05-23 MED ORDER — METFORMIN HCL 1000 MG PO TABS: 1000.0000 mg | ORAL_TABLET | Freq: Two times a day (BID) | ORAL | Status: DC

## 2013-05-23 MED ORDER — PANTOPRAZOLE SODIUM 40 MG PO TBEC
40.0000 mg | DELAYED_RELEASE_TABLET | Freq: Every day | ORAL | Status: DC
  Administered 2013-05-23: 40 mg via ORAL
  Filled 2013-05-23: qty 1

## 2013-05-23 MED ORDER — "INSULIN SYRINGE 31G X 5/16"" 0.5 ML MISC": 1.0000 | Freq: Three times a day (TID) | Status: DC

## 2013-05-23 MED ORDER — INSULIN ASPART 100 UNIT/ML ~~LOC~~ SOLN: 0.0000 [IU] | Freq: Three times a day (TID) | SUBCUTANEOUS | Status: DC

## 2013-05-23 NOTE — Progress Notes (Signed)
Pt being discharged as a newly diagnosed diabetic with insulin. Handouts reviewed and given to patient including: Signs/symptoms of high and low blood sugar, Diabetes and sick day management, Diabetes eating away from home, Diabetes meal planning guide, Metformin tablets, and Azithromycin tablets.   [name], [name]

## 2013-05-23 NOTE — Progress Notes (Signed)
Discharge instructions reviewed with patient. Prescriptions given to patient. PIV removed. Pt to be discharged home with husband.  Kathlene November, [name]

## 2013-05-23 NOTE — Discharge Summary (Signed)
Physician Discharge Summary  [name] ZOX:[id number] DOB: [birth year] DOA: 05/20/2013  PCP: No primary provider on file.  Admit date: 05/20/2013 Discharge date: 05/23/2013  Time spent: 35 minutes  Recommendations for Outpatient Follow-up:  1. Follow up with her primary care Dr.   Recommendations for primary care physician for things to follow:  Check blood sugar log.  Discharge Diagnoses:  Principal Problem:   Pancreatitis Active Problems:   Duodenitis  Discharge Condition: stable  Diet recommendation: diabetic  Filed Weights   05/20/13 2334 05/21/13 2147 05/22/13 2202  Weight: 69.582 kg (153 lb 6.4 oz) 69.446 kg (153 lb 1.6 oz) 70.081 kg (154 lb 8 oz)   History of present illness:  is a 32 y.o. female with Past medical history of pancreatitis, cholecystectomy. The patient is coming from home. The patient presents with the complaint of abdominal pain that has been ongoing since yesterday. The pain was located in the upper abdomen region and was felt like a sharp pain. The patient mentions that she has chronic dull abdominal pain in the same region which also that worsen over last 2 days. This morning when she woke up she had fever around 101.2 and throughout the day she continues to have nausea with 2 episodes of vomiting without any blood. She denies any diarrhea or constipation and has been passing gas. She also denies any significant medication change.   Hospital Course:  Acute pancreatitis - she is regularly followed by Dr. Dulce Sellar. She had episode of acute pancreatitis 2 years ago, complicated by pseudocyst formation, she underwent an EUS with a pseudocyst drainage. CT scan during this admission was only pertinent for pancreatic inflammation without evidence of pseudocyst formation. Patient improved relatively quickly, on hospital day 2 she was tolerating a fat free diabetic diet. Bipolar disorder - continue home medications.  Type 2 DM - patient persistently  hyperglycemic since admission, a hemoglobin A1c was checked, and he determined that at 9.5. Diabetes educator has been consulted, The patient was started on metformin 1000 twice daily, started 500 twice daily and progressively increase. She had gestational diabetes in the past, and at that time she was on sliding scale insulin, and she tells me that she was using 1-2 units 3 times a day. I gave her prescription for sliding scale insulin as well this time. Advised to check her sugars before breakfast lunch and dinner, and when she has an appointment in adult health clinic to bring her sugar log. Counseled about dieting and exercise. Cough - she has been having "chest congestion" for the past week. Cough more productive. Start Azithromycin for bronchitis and continue that for 2 days following discharge. Improved. Leukocytosis - likely due to #1. Resolved.   Procedures:  none   Consultations:  none  Discharge Exam: Filed Vitals:   05/22/13 1625 05/22/13 2202 05/23/13 0507 05/23/13 0926  BP: 129/85 138/87 136/87 120/75  Pulse: 90 80 64 89  Temp: 98.7 F (37.1 C) 99.2 F (37.3 C) 98.1 F (36.7 C) 97.9 F (36.6 C)  TempSrc:  Oral Oral   Resp: 18 18 18 18   Height:  5\' 3"  (1.6 m)    Weight:  81.191 kg (154 lb 8 oz)    SpO2: 99% 97% 98% 99%    General: NAD Cardiovascular: RRR Respiratory: CTA biL  Discharge Instructions  Discharge Orders   Future Orders Complete By Expires   Ambulatory referral to Nutrition and Diabetic Education  As directed    Comments:     Patient  with newly diagnosed DM.  A1c 9.5% (05/21/13).  Patient stated she would like to wait until after Christmas to attend classes.  Cell phone # for patient is: [phone]  Patient: Please call the [facility] after discharge to schedule an appointment for diabetes education if you do not hear from the center before discharge  [phone]       Medication List          aspirin-sod bicarb-citric acid 325 MG Tbef tablet  Commonly known as:  ALKA-SELTZER  Take 325 mg by mouth every 6 (six) hours as needed (for cold symtpoms).     azithromycin 500 MG tablet  Commonly known as:  ZITHROMAX  Take 1 tablet (500 mg total) by mouth daily.     ibuprofen 200 MG tablet  Commonly known as:  ADVIL,MOTRIN  Take 400 mg by mouth every 6 (six) hours as needed.     insulin aspart 100 UNIT/ML injection  Commonly known as:  novoLOG  - Inject 0-9 Units into the skin 3 (three) times daily with meals. CBG 70 - 120: 0 units     -   CBG 121 - 150: 1 unit     -   CBG 151 - 200: 2 units     -   CBG 201 - 250: 3 units     -   CBG 251 - 300: 4 units  -   CBG > 300 call MD     INSULIN SYRINGE .5CC/31GX5/16" 31G X 5/16" 0.5 ML Misc  1 Syringe by Does not apply route 3 (three) times daily before meals.     LATUDA 20 MG Tabs  Generic drug:  Lurasidone HCl  Take 20 mg by mouth daily.     metFORMIN 1000 MG tablet  Commonly known as:  GLUCOPHAGE  Take 1 tablet (1,000 mg total) by mouth 2 (two) times daily with a meal. Half a tablet twice daily for 5 days then 1 tablet in am and half in pm for 5 days then 1 tablet twice daily.     OLANZapine 10 MG tablet  Commonly known as:  ZYPREXA  Take 10 mg by mouth at bedtime.     traZODone 100 MG tablet  Commonly known as:  DESYREL  Take 200 mg by mouth at bedtime.           Follow-up Information   Follow up with [facility]    . (please call to arrange an appointment)    Contact information:   [address] [place] [address] [phone]      The results of significant diagnostics from this hospitalization (including imaging, microbiology, ancillary and laboratory) are listed below for reference.    Significant Diagnostic Studies: Dg Chest 2 View  05/22/2013   CLINICAL DATA:  Productive cough  EXAM: CHEST  2 VIEW  COMPARISON:  Portable chest x-ray of 06/24/2011  FINDINGS:  Minimal linear scarring at the left lung base remains. No active infiltrate or effusion is seen. Mediastinal contours are stable. The heart is within normal limits in size. No bony abnormality is noted. Surgical clips are present in the right upper quadrant from prior cholecystectomy.  IMPRESSION: No active lung disease. Mild left basilar linear atelectasis remains.   Electronically Signed   By: Dwyane Dee M.D.   On: 05/22/2013 08:41   Ct Abdomen Pelvis W Contrast  05/20/2013   CLINICAL DATA:  Upper abdominal pain since  yesterday. History of pancreatitis previously. Pancreatic pseudocyst on prior imaging.  EXAM: CT ABDOMEN AND PELVIS WITH CONTRAST  TECHNIQUE: Multidetector CT imaging of the abdomen and pelvis was performed using the standard protocol following bolus administration of intravenous contrast.  CONTRAST:  OMNIPAQUE IOHEXOL 300 MG/ML  SOLN  COMPARISON:  06/23/2011  FINDINGS: Subpleural dependent atelectasis is noted at both lung bases.  Hepatic hypodensity suggesting steatosis reidentified. 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 There is focal wall thickening of the duodenal bulb and 2nd portion of the duodenum, with surrounding stranding and prominent porta hepatis nodes measuring 8 mm maximally image 36. Trace fluid is identified in this region and adjacent to the pancreatic head. There is focal pancreatic ductal dilatation at its mid portion measuring 8 mm image 31, with truncation of the pancreatic tail possibly due to partial pancreatectomy core atrophy. No measurable peripancreatic or. Duodenal fluid collection is identified.  Hepatic hypodensity suggests steatosis. Spleen, adrenal glands are unremarkable. 2 mm nonobstructing left lower renal pole calculus image 47. Stable 8 mm right lower renal pole cortical hypodense lesion which is too small for accurate measurements but is unchanged and most likely represents a cyst.  No free air. No  colonic or small bowel wall thickening. Normal appendix. IUD in place within the otherwise normal-appearing uterus. Ovaries are normal. No acute osseous finding.  IMPRESSION: Upper abdominal stranding and fluid centered most around the duodenum bulb and 2nd portion of the duodenum, which could represent peptic ulcer disease or less likely neoplasm. There are inflammatory changes around the head of the pancreas and pancreatitis with secondary duodenal involvement could appear similar. No focal fluid collection to suggest abscess or pseudocyst formation at this time.   Electronically Signed   By: Christiana Pellant M.D.   On: 05/20/2013 20:58    Microbiology: No results found for this or any previous visit (from the past 240 hour(s)).   Labs: Basic Metabolic Panel:  Recent Labs Lab 05/20/13 1744 05/21/13 0600  NA 138 139  K 4.0 3.9  CL 98 105  CO2 24 23  GLUCOSE 299* 202*  BUN 7 5*  CREATININE 0.54 0.49*  CALCIUM 10.2 8.6   Liver Function Tests:  Recent Labs Lab 05/20/13 1744 05/21/13 0600  AST 21 15  ALT 39* 28  ALKPHOS 110 90  BILITOT 0.5 0.5  PROT 8.4* 6.8  ALBUMIN 4.3 3.4*    Recent Labs Lab 05/20/13 1744  LIPASE 253*   No results found for this basename: AMMONIA,  in the last 168 hours CBC:  Recent Labs Lab 05/20/13 1744 05/21/13 0600  WBC 15.0* 10.4  NEUTROABS 11.4*  --   HGB 16.7* 13.7  HCT 46.2* 38.5  MCV 84.0 87.1  PLT 256 184    Recent Labs Lab 05/22/13 1138 05/22/13 1625 05/22/13 2200 05/23/13 0720 05/23/13 1201  GLUCAP 151* 179* 229* 176* 179*   Signed:  Zenovia Justman  [facility] 05/23/2013, 2:27 PM

## 2013-09-29 ENCOUNTER — Other Ambulatory Visit: Payer: Self-pay | Admitting: Internal Medicine

## 2014-05-06 ENCOUNTER — Encounter (HOSPITAL_COMMUNITY): Payer: Self-pay | Admitting: Emergency Medicine

## 2014-05-06 ENCOUNTER — Emergency Department (HOSPITAL_COMMUNITY)
Admission: EM | Admit: 2014-05-06 | Discharge: 2014-05-07 | Disposition: A | Discharge status: Home / Self Care | Payer: No Typology Code available for payment source | Attending: Emergency Medicine | Admitting: Emergency Medicine

## 2014-05-06 DIAGNOSIS — Z720 Tobacco use: Secondary | ICD-10-CM | POA: Diagnosis not present

## 2014-05-06 DIAGNOSIS — F319 Bipolar disorder, unspecified: Secondary | ICD-10-CM | POA: Insufficient documentation

## 2014-05-06 DIAGNOSIS — N390 Urinary tract infection, site not specified: Secondary | ICD-10-CM | POA: Diagnosis not present

## 2014-05-06 DIAGNOSIS — R319 Hematuria, unspecified: Secondary | ICD-10-CM

## 2014-05-06 DIAGNOSIS — Z794 Long term (current) use of insulin: Secondary | ICD-10-CM | POA: Diagnosis not present

## 2014-05-06 DIAGNOSIS — R300 Dysuria: Secondary | ICD-10-CM | POA: Diagnosis present

## 2014-05-06 DIAGNOSIS — E119 Type 2 diabetes mellitus without complications: Secondary | ICD-10-CM | POA: Diagnosis not present

## 2014-05-06 DIAGNOSIS — Z880 Allergy status to penicillin: Secondary | ICD-10-CM | POA: Diagnosis not present

## 2014-05-06 DIAGNOSIS — Z79899 Other long term (current) drug therapy: Secondary | ICD-10-CM | POA: Diagnosis not present

## 2014-05-06 DIAGNOSIS — Z8719 Personal history of other diseases of the digestive system: Secondary | ICD-10-CM | POA: Insufficient documentation

## 2014-05-06 LAB — COMPREHENSIVE METABOLIC PANEL
ALT: 44 U/L — AB (ref 0–35)
AST: 22 U/L (ref 0–37)
Albumin: 3.8 g/dL (ref 3.5–5.2)
Alkaline Phosphatase: 113 U/L (ref 39–117)
Anion gap: 17 — ABNORMAL HIGH (ref 5–15)
BILIRUBIN TOTAL: 0.2 mg/dL — AB (ref 0.3–1.2)
BUN: 6 mg/dL (ref 6–23)
CHLORIDE: 100 meq/L (ref 96–112)
CO2: 25 meq/L (ref 19–32)
CREATININE: 0.7 mg/dL (ref 0.50–1.10)
Calcium: 10.2 mg/dL (ref 8.4–10.5)
GFR calc Af Amer: >90 mL/min (ref >90)
Glucose, Bld: 127 mg/dL — ABNORMAL HIGH (ref 70–99)
Potassium: 3.8 mEq/L (ref 3.7–5.3)
SODIUM: 142 meq/L (ref 137–147)
Total Protein: 8.5 g/dL — ABNORMAL HIGH (ref 6.0–8.3)

## 2014-05-06 LAB — URINE MICROSCOPIC-ADD ON

## 2014-05-06 LAB — CBC
HCT: 38.2 % (ref 36.0–46.0)
HEMOGLOBIN: 13.3 g/dL (ref 12.0–15.0)
MCH: 29.4 pg (ref 26.0–34.0)
MCHC: 34.8 g/dL (ref 30.0–36.0)
MCV: 84.3 fL (ref 78.0–100.0)
Platelets: 300 10*3/uL (ref 150–400)
RBC: 4.53 MIL/uL (ref 3.87–5.11)
RDW: 12.8 % (ref 11.5–15.5)
WBC: 10.3 10*3/uL (ref 4.0–10.5)

## 2014-05-06 LAB — URINALYSIS, ROUTINE W REFLEX MICROSCOPIC
Bilirubin Urine: NEGATIVE
Glucose, UA: NEGATIVE mg/dL
Ketones, ur: 15 mg/dL — AB
Nitrite: NEGATIVE
PH: 6 (ref 5.0–8.0)
Protein, ur: >300 mg/dL — AB
SPECIFIC GRAVITY, URINE: 1.025 (ref 1.005–1.030)
Urobilinogen, UA: 0.2 mg/dL (ref 0.0–1.0)

## 2014-05-06 MED ORDER — ONDANSETRON 4 MG PO TBDP
8.0000 mg | ORAL_TABLET | Freq: Once | ORAL | Status: AC
  Administered 2014-05-07: 8 mg via ORAL
  Filled 2014-05-06: qty 2

## 2014-05-06 MED ORDER — CEPHALEXIN 250 MG PO CAPS
1000.0000 mg | ORAL_CAPSULE | Freq: Once | ORAL | Status: AC
  Administered 2014-05-07: 1000 mg via ORAL
  Filled 2014-05-06: qty 4

## 2014-05-06 MED ORDER — CEPHALEXIN 500 MG PO CAPS: 500.0000 mg | ORAL_CAPSULE | Freq: Three times a day (TID) | ORAL | Status: DC

## 2014-05-06 MED ORDER — ONDANSETRON 8 MG PO TBDP: 8.0000 mg | ORAL_TABLET | Freq: Three times a day (TID) | ORAL | Status: DC | PRN

## 2014-05-06 NOTE — Discharge Instructions (Signed)

## 2014-05-06 NOTE — ED Provider Notes (Signed)
CSN: [id number]     Arrival date & time 05/06/14  1923 History   First MD Initiated Contact with Patient 05/06/14 2333     Chief Complaint  Patient presents with  . Urinary Tract Infection    The patient says she started having difficulty urinating and so made an appointment with her doctor.  The patient said she was not going to be able to be seen until friday.      HPI Patient complains of dysuria and urinary frequency over the past 3-4 days.  She's had nausea recurrently for several months and has not been able to see gastroenterology.  She reports no flank pain.  Low-grade fever last week without it since resolved.  She denies radiating flank pain at this time.  She reports she's had several urinary tract infections over the past couple months which is abnormal for her.  She is a diabetic but reports her blood sugars have been good.  She called her primary care doctor but cannot get in for several days and she was having too much discomfort. No history kidney stones.  Symptoms are mild to moderate in severity   Past Medical History  Diagnosis Date  . Pancreatitis   . Bipolar 1 disorder   . Diabetes mellitus     gestational diabeties   Past Surgical History  Procedure Laterality Date  . Cholecystectomy    . Eus  08/02/2011    Procedure: UPPER ENDOSCOPIC ULTRASOUND (EUS) RADIAL;  Surgeon: Freddy Jaksch, MD;  Location: [facility];  Service: Endoscopy;  Laterality: N/A;  . Fine needle aspiration  08/02/2011    Procedure: FINE NEEDLE ASPIRATION (FNA) RADIAL;  Surgeon: Freddy Jaksch, MD;  Location: [facility];  Service: Endoscopy;  Laterality: N/A;   History reviewed. No pertinent family history. History  Substance Use Topics  . Smoking status: Current Every Day Smoker -- 0.25 packs/day    Types: Cigarettes  . Smokeless tobacco: Not on file  . Alcohol Use: No   OB History    No data available     Review of Systems  All other systems reviewed and are  negative.     Allergies  Penicillins and Lactose intolerance (gi)  Home Medications   Prior to Admission medications   Medication Sig Start Date End Date Taking? Authorizing Provider  acetaminophen (TYLENOL) 500 MG tablet Take 15,000 mg by mouth every 6 (six) hours as needed for moderate pain.   Yes Historical Provider, MD  insulin aspart (NOVOLOG) 100 UNIT/ML injection Inject 0-9 Units into the skin 3 (three) times daily with meals. CBG 70 - 120: 0 units      CBG 121 - 150: 1 unit      CBG 151 - 200: 2 units      CBG 201 - 250: 3 units      CBG 251 - 300: 4 units   CBG > 300 call MD 05/23/13  Yes Costin Otelia Sergeant, MD  metFORMIN (GLUCOPHAGE) 1000 MG tablet Take 1 tablet (1,000 mg total) by mouth 2 (two) times daily with a meal. Half a tablet twice daily for 5 days then 1 tablet in am and half in pm for 5 days then 1 tablet twice daily. Patient taking differently: Take 1,000 mg by mouth 2 (two) times daily with a meal.  05/23/13  Yes Costin Otelia Sergeant, MD  omeprazole (PRILOSEC) 20 MG capsule Take 20 mg by mouth daily.   Yes Historical Provider, MD  Phenazopyridine HCl (AZO TABS  PO) Take 2 tablets by mouth daily as needed (UTI).   Yes Historical Provider, MD  QUEtiapine (SEROQUEL) 400 MG tablet Take 400 mg by mouth at bedtime.   Yes Historical Provider, MD  traZODone (DESYREL) 100 MG tablet Take 200 mg by mouth at bedtime.   Yes Historical Provider, MD  aspirin-sod bicarb-citric acid (ALKA-SELTZER) 325 MG TBEF tablet Take 325 mg by mouth every 6 (six) hours as needed (for cold symtpoms).     Historical Provider, MD  azithromycin (ZITHROMAX) 500 MG tablet Take 1 tablet (500 mg total) by mouth daily. 05/23/13   Costin Otelia SergeantM Gherghe, MD  cephALEXin (KEFLEX) 500 MG capsule Take 1 capsule (500 mg total) by mouth 3 (three) times daily. 05/06/14   Lyanne CoKevin M Ximenna Fonseca, MD  ibuprofen (ADVIL,MOTRIN) 200 MG tablet Take 400 mg by mouth every 6 (six) hours as needed.    Historical Provider, MD  Lurasidone HCl  (LATUDA) 20 MG TABS Take 20 mg by mouth daily.    Historical Provider, MD  OLANZapine (ZYPREXA) 10 MG tablet Take 10 mg by mouth at bedtime.      Historical Provider, MD  ondansetron (ZOFRAN ODT) 8 MG disintegrating tablet Take 1 tablet (8 mg total) by mouth every 8 (eight) hours as needed for nausea or vomiting. 05/06/14   Lyanne CoKevin M Gavan Nordby, MD   BP 125/95 mmHg  Pulse 105  Temp(Src) 98.6 F (37 C)  Resp 16  SpO2 98% Physical Exam  Constitutional: She is oriented to person, place, and time. She appears well-developed and well-nourished. No distress.  HENT:  Head: Normocephalic and atraumatic.  Eyes: EOM are normal.  Neck: Normal range of motion.  Cardiovascular: Normal rate, regular rhythm and normal heart sounds.   Pulmonary/Chest: Effort normal and breath sounds normal.  Abdominal: Soft. She exhibits no distension.  Mild suprapubic tenderness  Musculoskeletal: Normal range of motion.  Neurological: She is alert and oriented to person, place, and time.  Skin: Skin is warm and dry.  Psychiatric: She has a normal mood and affect. Judgment normal.  Nursing note and vitals reviewed.   ED Course  Procedures (including critical care time) Labs Review Labs Reviewed  URINALYSIS, ROUTINE W REFLEX MICROSCOPIC - Abnormal; Notable for the following:    APPearance TURBID (*)    Hgb urine dipstick LARGE (*)    Ketones, ur 15 (*)    Protein, ur >300 (*)    Leukocytes, UA MODERATE (*)    All other components within normal limits  COMPREHENSIVE METABOLIC PANEL - Abnormal; Notable for the following:    Glucose, Bld 127 (*)    Total Protein 8.5 (*)    ALT 44 (*)    Total Bilirubin 0.2 (*)    Anion gap 17 (*)    All other components within normal limits  URINE MICROSCOPIC-ADD ON - Abnormal; Notable for the following:    Bacteria, UA MANY (*)    Casts HYALINE CASTS (*)    Crystals CA OXALATE CRYSTALS (*)    All other components within normal limits  CBC  POC URINE PREG, ED    Imaging  Review No results found.   EKG Interpretation None      MDM   Final diagnoses:  Urinary tract infection with hematuria, site unspecified    Patient overall well-appearing.  Suspect non-complicated urinary tract infection.  Recurrent nausea do not believe this is related to her current urinary tract infection.  Doubt pyelonephritis.  Vital signs look good.  Home with Keflex.  Home with Zofran.  She previously saw [facility] but does not want to go back and see them.  Referral to [facility] gastroenterology.     Lyanne CoKevin M Laure Leone, MD 05/07/14 [id number]

## 2014-05-06 NOTE — ED Notes (Signed)
The patient says she started having difficulty urinating and so made an appointment with her doctor.  The patient said she was not going to be able to be seen until friday.  She advised me that her pain is unbearable and she needs to be seen.  She denies heavy bleeding but does say when she wipes it is pink tinged.

## 2014-08-21 ENCOUNTER — Emergency Department (HOSPITAL_COMMUNITY)
Admission: EM | Admit: 2014-08-21 | Discharge: 2014-08-21 | Disposition: A | Discharge status: Home / Self Care | Payer: No Typology Code available for payment source | Attending: Emergency Medicine | Admitting: Emergency Medicine

## 2014-08-21 ENCOUNTER — Encounter (HOSPITAL_COMMUNITY): Payer: Self-pay | Admitting: *Deleted

## 2014-08-21 DIAGNOSIS — Z3202 Encounter for pregnancy test, result negative: Secondary | ICD-10-CM | POA: Diagnosis not present

## 2014-08-21 DIAGNOSIS — Z720 Tobacco use: Secondary | ICD-10-CM | POA: Diagnosis not present

## 2014-08-21 DIAGNOSIS — Z8719 Personal history of other diseases of the digestive system: Secondary | ICD-10-CM | POA: Diagnosis not present

## 2014-08-21 DIAGNOSIS — E119 Type 2 diabetes mellitus without complications: Secondary | ICD-10-CM | POA: Insufficient documentation

## 2014-08-21 DIAGNOSIS — Z880 Allergy status to penicillin: Secondary | ICD-10-CM | POA: Insufficient documentation

## 2014-08-21 DIAGNOSIS — N12 Tubulo-interstitial nephritis, not specified as acute or chronic: Secondary | ICD-10-CM | POA: Diagnosis not present

## 2014-08-21 DIAGNOSIS — Z79899 Other long term (current) drug therapy: Secondary | ICD-10-CM | POA: Diagnosis not present

## 2014-08-21 DIAGNOSIS — Z794 Long term (current) use of insulin: Secondary | ICD-10-CM | POA: Diagnosis not present

## 2014-08-21 DIAGNOSIS — R509 Fever, unspecified: Secondary | ICD-10-CM | POA: Diagnosis present

## 2014-08-21 DIAGNOSIS — F419 Anxiety disorder, unspecified: Secondary | ICD-10-CM | POA: Insufficient documentation

## 2014-08-21 DIAGNOSIS — Z7982 Long term (current) use of aspirin: Secondary | ICD-10-CM | POA: Insufficient documentation

## 2014-08-21 LAB — COMPREHENSIVE METABOLIC PANEL
ALT: 24 U/L (ref 0–35)
AST: 21 U/L (ref 0–37)
Albumin: 4.2 g/dL (ref 3.5–5.2)
Alkaline Phosphatase: 67 U/L (ref 39–117)
Anion gap: 11 (ref 5–15)
BUN: 8 mg/dL (ref 6–23)
CALCIUM: 9.9 mg/dL (ref 8.4–10.5)
CO2: 25 mmol/L (ref 19–32)
Chloride: 98 mmol/L (ref 96–112)
Creatinine, Ser: 0.88 mg/dL (ref 0.50–1.10)
GFR calc Af Amer: >90 mL/min (ref >90)
GFR, EST NON AFRICAN AMERICAN: 86 mL/min — AB (ref >90)
Glucose, Bld: 159 mg/dL — ABNORMAL HIGH (ref 70–99)
Potassium: 3.8 mmol/L (ref 3.5–5.1)
SODIUM: 134 mmol/L — AB (ref 135–145)
TOTAL PROTEIN: 7.9 g/dL (ref 6.0–8.3)
Total Bilirubin: 0.7 mg/dL (ref 0.3–1.2)

## 2014-08-21 LAB — CBC WITH DIFFERENTIAL/PLATELET
BASOS ABS: 0 10*3/uL (ref 0.0–0.1)
Basophils Relative: 0 % (ref 0–1)
EOS ABS: 0.1 10*3/uL (ref 0.0–0.7)
Eosinophils Relative: 1 % (ref 0–5)
HCT: 37.2 % (ref 36.0–46.0)
Hemoglobin: 12.6 g/dL (ref 12.0–15.0)
LYMPHS PCT: 17 % (ref 12–46)
Lymphs Abs: 1.5 10*3/uL (ref 0.7–4.0)
MCH: 28.9 pg (ref 26.0–34.0)
MCHC: 33.9 g/dL (ref 30.0–36.0)
MCV: 85.3 fL (ref 78.0–100.0)
MONOS PCT: 8 % (ref 3–12)
Monocytes Absolute: 0.7 10*3/uL (ref 0.1–1.0)
NEUTROS ABS: 6.4 10*3/uL (ref 1.7–7.7)
Neutrophils Relative %: 74 % (ref 43–77)
Platelets: 193 10*3/uL (ref 150–400)
RBC: 4.36 MIL/uL (ref 3.87–5.11)
RDW: 13 % (ref 11.5–15.5)
WBC: 8.7 10*3/uL (ref 4.0–10.5)

## 2014-08-21 LAB — URINALYSIS, ROUTINE W REFLEX MICROSCOPIC
Glucose, UA: NEGATIVE mg/dL
Ketones, ur: 15 mg/dL — AB
Nitrite: POSITIVE — AB
Protein, ur: 100 mg/dL — AB
Specific Gravity, Urine: 1.035 — ABNORMAL HIGH (ref 1.005–1.030)
Urobilinogen, UA: 1 mg/dL (ref 0.0–1.0)
pH: 6 (ref 5.0–8.0)

## 2014-08-21 LAB — URINE MICROSCOPIC-ADD ON

## 2014-08-21 LAB — POC URINE PREG, ED: PREG TEST UR: NEGATIVE

## 2014-08-21 LAB — LIPASE, BLOOD: Lipase: 34 U/L (ref 11–59)

## 2014-08-21 MED ORDER — ONDANSETRON 4 MG PO TBDP: ORAL_TABLET | ORAL | Status: DC

## 2014-08-21 MED ORDER — ACETAMINOPHEN 325 MG PO TABS
ORAL_TABLET | ORAL | Status: AC
  Filled 2014-08-21: qty 2

## 2014-08-21 MED ORDER — CIPROFLOXACIN IN D5W 400 MG/200ML IV SOLN
400.0000 mg | Freq: Once | INTRAVENOUS | Status: AC
  Administered 2014-08-21: 400 mg via INTRAVENOUS
  Filled 2014-08-21: qty 200

## 2014-08-21 MED ORDER — ACETAMINOPHEN 325 MG PO TABS
650.0000 mg | ORAL_TABLET | Freq: Once | ORAL | Status: AC
  Administered 2014-08-21: 650 mg via ORAL

## 2014-08-21 MED ORDER — CIPROFLOXACIN HCL 500 MG PO TABS: 500.0000 mg | ORAL_TABLET | Freq: Two times a day (BID) | ORAL | Status: DC

## 2014-08-21 MED ORDER — ONDANSETRON HCL 4 MG/2ML IJ SOLN
4.0000 mg | Freq: Once | INTRAMUSCULAR | Status: AC
  Administered 2014-08-21: 4 mg via INTRAVENOUS
  Filled 2014-08-21: qty 2

## 2014-08-21 MED ORDER — HYDROCODONE-ACETAMINOPHEN 5-325 MG PO TABS: 1.0000 | ORAL_TABLET | Freq: Four times a day (QID) | ORAL | Status: DC | PRN

## 2014-08-21 MED ORDER — HYDROMORPHONE HCL 1 MG/ML IJ SOLN
1.0000 mg | Freq: Once | INTRAMUSCULAR | Status: AC
  Administered 2014-08-21: 1 mg via INTRAVENOUS
  Filled 2014-08-21: qty 1

## 2014-08-21 MED ORDER — ACETAMINOPHEN 500 MG PO TABS
1000.0000 mg | ORAL_TABLET | Freq: Once | ORAL | Status: AC
  Administered 2014-08-21: 1000 mg via ORAL
  Filled 2014-08-21: qty 2

## 2014-08-21 MED ORDER — SODIUM CHLORIDE 0.9 % IV BOLUS (SEPSIS)
2000.0000 mL | Freq: Once | INTRAVENOUS | Status: AC
  Administered 2014-08-21: 2000 mL via INTRAVENOUS

## 2014-08-21 NOTE — ED Provider Notes (Signed)
CSN: [id number]     Arrival date & time 08/21/14  1523 History   First MD Initiated Contact with Patient 08/21/14 1928     Chief Complaint  Patient presents with  . Fever     (Consider location/radiation/quality/duration/timing/severity/associated sxs/prior Treatment) Patient is a 34 y.o. female presenting with abdominal pain. The history is provided by the patient.  Abdominal Pain Pain location:  R flank Pain quality: aching and dull   Pain quality: not heavy and no pressure   Pain radiates to:  Does not radiate Pain severity:  Moderate Onset quality:  Gradual Duration:  1 week Timing:  Constant Progression:  Worsening Chronicity:  New Relieved by:  Nothing Worsened by:  Nothing tried Associated symptoms: chills, fever, nausea and vomiting   Associated symptoms: no chest pain, no cough and no shortness of breath     Past Medical History  Diagnosis Date  . Pancreatitis   . Bipolar 1 disorder   . Diabetes mellitus     gestational diabeties   Past Surgical History  Procedure Laterality Date  . Cholecystectomy    . Eus  08/02/2011    Procedure: UPPER ENDOSCOPIC ULTRASOUND (EUS) RADIAL;  Surgeon: Freddy Jaksch, MD;  Location: [facility];  Service: Endoscopy;  Laterality: N/A;  . Fine needle aspiration  08/02/2011    Procedure: FINE NEEDLE ASPIRATION (FNA) RADIAL;  Surgeon: Freddy Jaksch, MD;  Location: [facility];  Service: Endoscopy;  Laterality: N/A;   No family history on file. History  Substance Use Topics  . Smoking status: Current Every Day Smoker -- 0.25 packs/day    Types: Cigarettes  . Smokeless tobacco: Not on file  . Alcohol Use: No   OB History    No data available     Review of Systems  Constitutional: Positive for fever and chills.  Respiratory: Negative for cough and shortness of breath.   Cardiovascular: Negative for chest pain and leg swelling.  Gastrointestinal: Positive for nausea, vomiting and abdominal pain.  All other systems  reviewed and are negative.     Allergies  Penicillins and Lactose intolerance (gi)  Home Medications   Prior to Admission medications   Medication Sig Start Date End Date Taking? Authorizing Provider  acetaminophen (TYLENOL) 500 MG tablet Take 15,000 mg by mouth every 6 (six) hours as needed for moderate pain.   Yes Historical Provider, MD  aspirin-sod bicarb-citric acid (ALKA-SELTZER) 325 MG TBEF tablet Take 325 mg by mouth every 6 (six) hours as needed (for cold symtpoms).    Yes Historical Provider, MD  ibuprofen (ADVIL,MOTRIN) 200 MG tablet Take 400 mg by mouth every 6 (six) hours as needed for moderate pain.    Yes Historical Provider, MD  insulin aspart (NOVOLOG) 100 UNIT/ML injection Inject 0-9 Units into the skin 3 (three) times daily with meals. CBG 70 - 120: 0 units      CBG 121 - 150: 1 unit      CBG 151 - 200: 2 units      CBG 201 - 250: 3 units      CBG 251 - 300: 4 units   CBG > 300 call MD 05/23/13  Yes Costin Otelia Sergeant, MD  metFORMIN (GLUCOPHAGE) 1000 MG tablet Take 1 tablet (1,000 mg total) by mouth 2 (two) times daily with a meal. Half a tablet twice daily for 5 days then 1 tablet in am and half in pm for 5 days then 1 tablet twice daily. Patient taking differently: Take 1,000 mg  by mouth 2 (two) times daily with a meal.  05/23/13  Yes Costin Otelia SergeantM Gherghe, MD  OLANZapine (ZYPREXA) 10 MG tablet Take 10 mg by mouth at bedtime.     Yes Historical Provider, MD  ondansetron (ZOFRAN ODT) 8 MG disintegrating tablet Take 1 tablet (8 mg total) by mouth every 8 (eight) hours as needed for nausea or vomiting. 05/06/14  Yes Lyanne CoKevin M Campos, MD  QUEtiapine (SEROQUEL) 400 MG tablet Take 400 mg by mouth at bedtime.   Yes Historical Provider, MD  traZODone (DESYREL) 100 MG tablet Take 400 mg by mouth at bedtime.    Yes Historical Provider, MD  azithromycin (ZITHROMAX) 500 MG tablet Take 1 tablet (500 mg total) by mouth daily. Patient not taking: Reported on 08/21/2014 05/23/13   Leatha Gildingostin M  Gherghe, MD  cephALEXin (KEFLEX) 500 MG capsule Take 1 capsule (500 mg total) by mouth 3 (three) times daily. Patient not taking: Reported on 08/21/2014 05/06/14   Lyanne CoKevin M Campos, MD  Lurasidone HCl (LATUDA) 20 MG TABS Take 20 mg by mouth daily.    Historical Provider, MD  Phenazopyridine HCl (AZO TABS PO) Take 2 tablets by mouth daily as needed (UTI).    Historical Provider, MD   BP 127/93 mmHg  Pulse 126  Temp(Src) 100.6 F (38.1 C) (Oral)  Resp 18  Ht 5\' 2"  (1.575 m)  Wt 150 lb (68.04 kg)  BMI 27.43 kg/m2  SpO2 98% Physical Exam  Constitutional: She is oriented to person, place, and time. She appears well-developed and well-nourished. No distress.  HENT:  Head: Normocephalic and atraumatic.  Mouth/Throat: Oropharynx is clear and moist. No oropharyngeal exudate.  Eyes: EOM are normal. Pupils are equal, round, and reactive to light.  Neck: Normal range of motion. Neck supple.  Cardiovascular: Normal rate and regular rhythm.  Exam reveals no friction rub.   No murmur heard. Pulmonary/Chest: Effort normal and breath sounds normal. No respiratory distress. She has no wheezes. She has no rales.  Abdominal: Soft. She exhibits no distension. There is tenderness (R CVA). There is no rebound.  Musculoskeletal: Normal range of motion. She exhibits no edema.  Neurological: She is alert and oriented to person, place, and time.  Skin: Skin is warm. No rash noted. She is not diaphoretic.  Nursing note and vitals reviewed.   ED Course  Procedures (including critical care time) Labs Review Labs Reviewed  COMPREHENSIVE METABOLIC PANEL - Abnormal; Notable for the following:    Sodium 134 (*)    Glucose, Bld 159 (*)    GFR calc non Af Amer 86 (*)    All other components within normal limits  URINALYSIS, ROUTINE W REFLEX MICROSCOPIC - Abnormal; Notable for the following:    Color, Urine AMBER (*)    APPearance CLOUDY (*)    Specific Gravity, Urine 1.035 (*)    Hgb urine dipstick SMALL (*)     Bilirubin Urine SMALL (*)    Ketones, ur 15 (*)    Protein, ur 100 (*)    Nitrite POSITIVE (*)    Leukocytes, UA MODERATE (*)    All other components within normal limits  URINE MICROSCOPIC-ADD ON - Abnormal; Notable for the following:    Bacteria, UA MANY (*)    All other components within normal limits  URINE CULTURE  CBC WITH DIFFERENTIAL/PLATELET  LIPASE, BLOOD  POC URINE PREG, ED    Imaging Review No results found.   EKG Interpretation None      MDM   Final  diagnoses:  Pyelonephritis    64F here with back pain, fever. Sent from PCP's office - had UTI at PCP's office. Chills present with fever for past few days, back pain present for about 1 week. No dysuria.  Here febrile, tachycardia. She does have a white count. Mild R CVA tenderness on exam. No other abdominal pain. Exam c/w pyelonephritis. Patient doing much better after fluids. Stable for discharge, wants to go home.    [name], MD 08/22/14 [phone]

## 2014-08-21 NOTE — ED Notes (Signed)
Charge rn aware of pt and working on room

## 2014-08-21 NOTE — ED Notes (Signed)
The pt has had a fever chills  For 6 days.  She was sent here from her doctors office today . lmp  None iud

## 2014-08-21 NOTE — Discharge Instructions (Signed)
Pyelonephritis, Adult °Pyelonephritis is a kidney infection. In general, there are 2 main types of pyelonephritis: °· Infections that come on quickly without any warning (acute pyelonephritis). °· Infections that persist for a long period of time (chronic pyelonephritis). °CAUSES  °Two main causes of pyelonephritis are: °· Bacteria traveling from the bladder to the kidney. This is a problem especially in pregnant women. The urine in the bladder can become filled with bacteria from multiple causes, including: °¨ Inflammation of the prostate gland (prostatitis). °¨ Sexual intercourse in females. °¨ Bladder infection (cystitis). °· Bacteria traveling from the bloodstream to the tissue part of the kidney. °Problems that may increase your risk of getting a kidney infection include: °· Diabetes. °· Kidney stones or bladder stones. °· Cancer. °· Catheters placed in the bladder. °· Other abnormalities of the kidney or ureter. °SYMPTOMS  °· Abdominal pain. °· Pain in the side or flank area. °· Fever. °· Chills. °· Upset stomach. °· Blood in the urine (dark urine). °· Frequent urination. °· Strong or persistent urge to urinate. °· Burning or stinging when urinating. °DIAGNOSIS  °Your caregiver may diagnose your kidney infection based on your symptoms. A urine sample may also be taken. °TREATMENT  °In general, treatment depends on how severe the infection is.  °· If the infection is mild and caught early, your caregiver may treat you with oral antibiotics and send you home. °· If the infection is more severe, the bacteria may have gotten into the bloodstream. This will require intravenous (IV) antibiotics and a hospital stay. Symptoms may include: °¨ High fever. °¨ Severe flank pain. °¨ Shaking chills. °· Even after a hospital stay, your caregiver may require you to be on oral antibiotics for a period of time. °· Other treatments may be required depending upon the cause of the infection. °HOME CARE INSTRUCTIONS  °· Take your  antibiotics as directed. Finish them even if you start to feel better. °· Make an appointment to have your urine checked to make sure the infection is gone. °· Drink enough fluids to keep your urine clear or pale yellow. °· Take medicines for the bladder if you have urgency and frequency of urination as directed by your caregiver. °SEEK IMMEDIATE MEDICAL CARE IF:  °· You have a fever or persistent symptoms for more than 2-3 days. °· You have a fever and your symptoms suddenly get worse. °· You are unable to take your antibiotics or fluids. °· You develop shaking chills. °· You experience extreme weakness or fainting. °· There is no improvement after 2 days of treatment. °MAKE SURE YOU: °· Understand these instructions. °· Will watch your condition. °· Will get help right away if you are not doing well or get worse. °Document Released: 06/19/2005 Document Revised: 12/19/2011 Document Reviewed: 11/23/2010 °[employer]® Patient Information ©2015 [employer]. This information is not intended to replace advice given to you by your health care provider. Make sure you discuss any questions you have with your health care provider. ° °

## 2014-08-23 LAB — URINE CULTURE: Colony Count: >=100000

## 2014-09-07 ENCOUNTER — Other Ambulatory Visit: Payer: Self-pay | Admitting: Family Medicine

## 2014-09-07 DIAGNOSIS — R109 Unspecified abdominal pain: Secondary | ICD-10-CM

## 2014-09-08 ENCOUNTER — Ambulatory Visit
Admission: RE | Admit: 2014-09-08 | Discharge: 2014-09-08 | Disposition: A | Discharge status: Home / Self Care | Payer: No Typology Code available for payment source | Source: Ambulatory Visit | Attending: Family Medicine | Admitting: Family Medicine

## 2014-09-08 DIAGNOSIS — R109 Unspecified abdominal pain: Secondary | ICD-10-CM

## 2015-02-01 ENCOUNTER — Emergency Department (HOSPITAL_COMMUNITY)
Admission: EM | Admit: 2015-02-01 | Discharge: 2015-02-01 | Disposition: A | Discharge status: Home / Self Care | Payer: No Typology Code available for payment source | Attending: Emergency Medicine | Admitting: Emergency Medicine

## 2015-02-01 ENCOUNTER — Encounter (HOSPITAL_COMMUNITY): Payer: Self-pay | Admitting: Emergency Medicine

## 2015-02-01 DIAGNOSIS — J029 Acute pharyngitis, unspecified: Secondary | ICD-10-CM | POA: Diagnosis not present

## 2015-02-01 DIAGNOSIS — Z880 Allergy status to penicillin: Secondary | ICD-10-CM | POA: Insufficient documentation

## 2015-02-01 DIAGNOSIS — Z8719 Personal history of other diseases of the digestive system: Secondary | ICD-10-CM | POA: Insufficient documentation

## 2015-02-01 DIAGNOSIS — F319 Bipolar disorder, unspecified: Secondary | ICD-10-CM | POA: Insufficient documentation

## 2015-02-01 DIAGNOSIS — M2662 Arthralgia of temporomandibular joint: Secondary | ICD-10-CM | POA: Insufficient documentation

## 2015-02-01 DIAGNOSIS — Z79899 Other long term (current) drug therapy: Secondary | ICD-10-CM | POA: Insufficient documentation

## 2015-02-01 DIAGNOSIS — Z794 Long term (current) use of insulin: Secondary | ICD-10-CM | POA: Insufficient documentation

## 2015-02-01 DIAGNOSIS — R52 Pain, unspecified: Secondary | ICD-10-CM | POA: Insufficient documentation

## 2015-02-01 DIAGNOSIS — Z8632 Personal history of gestational diabetes: Secondary | ICD-10-CM | POA: Diagnosis not present

## 2015-02-01 LAB — I-STAT CHEM 8, ED
BUN: 9 mg/dL (ref 6–20)
CALCIUM ION: 1.19 mmol/L (ref 1.12–1.23)
CHLORIDE: 107 mmol/L (ref 101–111)
Creatinine, Ser: 0.6 mg/dL (ref 0.44–1.00)
Glucose, Bld: 194 mg/dL — ABNORMAL HIGH (ref 65–99)
HEMATOCRIT: 38 % (ref 36.0–46.0)
Hemoglobin: 12.9 g/dL (ref 12.0–15.0)
Potassium: 3.8 mmol/L (ref 3.5–5.1)
Sodium: 137 mmol/L (ref 135–145)
TCO2: 17 mmol/L (ref 0–100)

## 2015-02-01 LAB — CBC WITH DIFFERENTIAL/PLATELET
BASOS PCT: 0 % (ref 0–1)
Basophils Absolute: 0 10*3/uL (ref 0.0–0.1)
EOS ABS: 0.1 10*3/uL (ref 0.0–0.7)
Eosinophils Relative: 2 % (ref 0–5)
HEMATOCRIT: 36.7 % (ref 36.0–46.0)
Hemoglobin: 12.7 g/dL (ref 12.0–15.0)
Lymphocytes Relative: 23 % (ref 12–46)
Lymphs Abs: 1.4 10*3/uL (ref 0.7–4.0)
MCH: 30.1 pg (ref 26.0–34.0)
MCHC: 34.6 g/dL (ref 30.0–36.0)
MCV: 87 fL (ref 78.0–100.0)
MONO ABS: 0.5 10*3/uL (ref 0.1–1.0)
Monocytes Relative: 8 % (ref 3–12)
Neutro Abs: 3.9 10*3/uL (ref 1.7–7.7)
Neutrophils Relative %: 67 % (ref 43–77)
PLATELETS: 170 10*3/uL (ref 150–400)
RBC: 4.22 MIL/uL (ref 3.87–5.11)
RDW: 12.8 % (ref 11.5–15.5)
WBC: 5.9 10*3/uL (ref 4.0–10.5)

## 2015-02-01 LAB — URINALYSIS, ROUTINE W REFLEX MICROSCOPIC
Bilirubin Urine: NEGATIVE
Glucose, UA: NEGATIVE mg/dL
Ketones, ur: NEGATIVE mg/dL
Nitrite: NEGATIVE
PH: 7 (ref 5.0–8.0)
Protein, ur: NEGATIVE mg/dL
Specific Gravity, Urine: 1.011 (ref 1.005–1.030)
Urobilinogen, UA: 0.2 mg/dL (ref 0.0–1.0)

## 2015-02-01 LAB — URINE MICROSCOPIC-ADD ON

## 2015-02-01 LAB — CBG MONITORING, ED
Glucose-Capillary: 191 mg/dL — ABNORMAL HIGH (ref 65–99)
Glucose-Capillary: 200 mg/dL — ABNORMAL HIGH (ref 65–99)

## 2015-02-01 LAB — RAPID STREP SCREEN (MED CTR MEBANE ONLY): Streptococcus, Group A Screen (Direct): NEGATIVE

## 2015-02-01 MED ORDER — OXYCODONE-ACETAMINOPHEN 5-325 MG PO TABS
2.0000 | ORAL_TABLET | Freq: Once | ORAL | Status: AC
  Administered 2015-02-01: 2 via ORAL
  Filled 2015-02-01: qty 2

## 2015-02-01 MED ORDER — METHOCARBAMOL 500 MG PO TABS: 500.0000 mg | ORAL_TABLET | Freq: Two times a day (BID) | ORAL | Status: DC

## 2015-02-01 MED ORDER — METHOCARBAMOL 1000 MG/10ML IJ SOLN
1000.0000 mg | Freq: Once | INTRAMUSCULAR | Status: AC
  Administered 2015-02-01: 1000 mg via INTRAMUSCULAR
  Filled 2015-02-01: qty 10

## 2015-02-01 MED ORDER — SODIUM CHLORIDE 0.9 % IV BOLUS (SEPSIS)
1000.0000 mL | Freq: Once | INTRAVENOUS | Status: AC
  Administered 2015-02-01: 1000 mL via INTRAVENOUS

## 2015-02-01 MED ORDER — KETOROLAC TROMETHAMINE 30 MG/ML IJ SOLN
30.0000 mg | Freq: Once | INTRAMUSCULAR | Status: AC
  Administered 2015-02-01: 30 mg via INTRAVENOUS
  Filled 2015-02-01: qty 1

## 2015-02-01 NOTE — ED Provider Notes (Signed)
  Physical Exam  BP 124/89 mmHg  Pulse 104  Temp(Src) 98.9 F (37.2 C) (Oral)  Resp 18  SpO2 98%  Physical Exam  ED Course  Procedures Patient signed out to me by Elsie Stain, NP. This patient has a history of diabetes and came to the ED for generalized body aches and was given 2 percocet.  All labs were normal with no UTI and no signs of DKA. She continued to have pain. Toradol and robaxin were given 15 minutes ago.  The plan is to discharge the patient and follow up with her pcp.   6:42 Patient states she is starting to feeling much better.  I discussed following up with her pcp and gave her robaxin for muscle aches.     [name], PA-C 02/01/15 1610  Dione Booze, MD 02/01/15 [phone]

## 2015-02-01 NOTE — Discharge Instructions (Signed)
Musculoskeletal Pain Follow up with your primary care physician.  Do not take muscle relaxers while working or driving.  Musculoskeletal pain is muscle and boney aches and pains. These pains can occur in any part of the body. Your caregiver may treat you without knowing the cause of the pain. They may treat you if blood or urine tests, X-rays, and other tests were normal.  CAUSES There is often not a definite cause or reason for these pains. These pains may be caused by a type of germ (virus). The discomfort may also come from overuse. Overuse includes working out too hard when your body is not fit. Boney aches also come from weather changes. Bone is sensitive to atmospheric pressure changes. HOME CARE INSTRUCTIONS   Ask when your test results will be ready. Make sure you get your test results.  Only take over-the-counter or prescription medicines for pain, discomfort, or fever as directed by your caregiver. If you were given medications for your condition, do not drive, operate machinery or power tools, or sign legal documents for 24 hours. Do not drink alcohol. Do not take sleeping pills or other medications that may interfere with treatment.  Continue all activities unless the activities cause more pain. When the pain lessens, slowly resume normal activities. Gradually increase the intensity and duration of the activities or exercise.  During periods of severe pain, bed rest may be helpful. Lay or sit in any position that is comfortable.  Putting ice on the injured area.  Put ice in a bag.  Place a towel between your skin and the bag.  Leave the ice on for 15 to 20 minutes, 3 to 4 times a day.  Follow up with your caregiver for continued problems and no reason can be found for the pain. If the pain becomes worse or does not go away, it may be necessary to repeat tests or do additional testing. Your caregiver may need to look further for a possible cause. SEEK IMMEDIATE MEDICAL CARE  IF:  You have pain that is getting worse and is not relieved by medications.  You develop chest pain that is associated with shortness or breath, sweating, feeling sick to your stomach (nauseous), or throw up (vomit).  Your pain becomes localized to the abdomen.  You develop any new symptoms that seem different or that concern you. MAKE SURE YOU:   Understand these instructions.  Will watch your condition.  Will get help right away if you are not doing well or get worse. Document Released: 06/19/2005 Document Revised: 09/11/2011 Document Reviewed: 02/21/2013 [facility] Patient Information 2015 [place], [place]. This information is not intended to replace advice given to you by your health care provider. Make sure you discuss any questions you have with your health care provider.

## 2015-02-01 NOTE — ED Provider Notes (Signed)
CSN: [id number]     Arrival date & time 02/01/15  0209 History   First MD Initiated Contact with Patient 02/01/15 0234     Chief Complaint  Patient presents with  . Generalized Body Aches     (Consider location/radiation/quality/duration/timing/severity/associated sxs/prior Treatment) HPI Comments:  This is a 34 year old female with complaint of generalized body aches sensitive to even the slightest  Touch  this is been going on since Saturday afternoon   she states on Friday.  She thought she pulled a muscle took a Xanax and when she woke up Saturday morning.  She was stiff all of her denies any fever she said she might have a sore throat.  Denies any nausea, vomiting, diarrhea, cough, URI symptoms, dysuria, vaginal discharge.  States she's been taking ibuprofen with no relief of her discomfort.  She's never had pain like this before  The history is provided by the patient.    Past Medical History  Diagnosis Date  . Pancreatitis   . Bipolar 1 disorder   . Diabetes mellitus     gestational diabeties   Past Surgical History  Procedure Laterality Date  . Cholecystectomy    . Eus  08/02/2011    Procedure: UPPER ENDOSCOPIC ULTRASOUND (EUS) RADIAL;  Surgeon: Freddy Jaksch, MD;  Location: [facility];  Service: Endoscopy;  Laterality: N/A;  . Fine needle aspiration  08/02/2011    Procedure: FINE NEEDLE ASPIRATION (FNA) RADIAL;  Surgeon: Freddy Jaksch, MD;  Location: [facility];  Service: Endoscopy;  Laterality: N/A;   History reviewed. No pertinent family history. History  Substance Use Topics  . Smoking status: Current Every Day Smoker -- 0.25 packs/day    Types: Cigarettes  . Smokeless tobacco: Not on file  . Alcohol Use: No   OB History    No data available     Review of Systems  Constitutional: Negative for fever and chills.  HENT: Positive for sore throat. Negative for rhinorrhea and trouble swallowing.   Respiratory: Negative for cough and shortness of breath.    Cardiovascular: Negative for chest pain and leg swelling.  Gastrointestinal: Negative for nausea, vomiting, abdominal pain, diarrhea and constipation.  Genitourinary: Negative for dysuria, vaginal discharge and pelvic pain.  Musculoskeletal: Positive for arthralgias. Negative for joint swelling.  Skin: Positive for color change. Negative for pallor, rash and wound.        States had bruising for 2 days that spontaneously disappeared  Neurological: Negative for dizziness, weakness, numbness and headaches.  All other systems reviewed and are negative.     Allergies  Penicillins; Depakote er; and Lactose intolerance (gi)  Home Medications   Prior to Admission medications   Medication Sig Start Date End Date Taking? Authorizing Provider  ALPRAZolam [name]) 0.5 MG tablet Take 0.5-1 mg by mouth 3 (three) times daily as needed for anxiety.   Yes Historical Provider, MD  glimepiride (AMARYL) 2 MG tablet Take 2 mg by mouth 3 (three) times daily.   Yes Historical Provider, MD  ibuprofen (ADVIL,MOTRIN) 200 MG tablet Take 800 mg by mouth every 6 (six) hours as needed for moderate pain.    Yes Historical Provider, MD  Melatonin 3 MG TABS Take 3 mg by mouth at bedtime as needed (sleep).   Yes Historical Provider, MD  QUEtiapine (SEROQUEL) 400 MG tablet Take 400 mg by mouth at bedtime.   Yes Historical Provider, MD  traZODone (DESYREL) 150 MG tablet Take 75-300 mg by mouth at bedtime. Pt took only 75mg  tonight  but most of the time takes 300mg    Yes Historical Provider, MD  azithromycin (ZITHROMAX) 500 MG tablet Take 1 tablet (500 mg total) by mouth daily. Patient not taking: Reported on 08/21/2014 05/23/13   Leatha Gilding, MD  cephALEXin (KEFLEX) 500 MG capsule Take 1 capsule (500 mg total) by mouth 3 (three) times daily. Patient not taking: Reported on 08/21/2014 05/06/14   [name], MD  ciprofloxacin (CIPRO) 500 MG tablet Take 1 tablet (500 mg total) by mouth 2 (two) times daily. One po bid x  14 days Patient not taking: Reported on 02/01/2015 08/21/14   [name], MD  HYDROcodone-acetaminophen (NORCO/VICODIN) 5-325 MG per tablet Take 1 tablet by mouth every 6 (six) hours as needed for moderate pain. Patient not taking: Reported on 02/01/2015 08/21/14   [name], MD  insulin aspart (NOVOLOG) 100 UNIT/ML injection Inject 0-9 Units into the skin 3 (three) times daily with meals. CBG 70 - 120: 0 units      CBG 121 - 150: 1 unit      CBG 151 - 200: 2 units      CBG 201 - 250: 3 units      CBG 251 - 300: 4 units   CBG > 300 call MD 05/23/13   Leatha Gilding, MD  metFORMIN (GLUCOPHAGE) 1000 MG tablet Take 1 tablet (1,000 mg total) by mouth 2 (two) times daily with a meal. Half a tablet twice daily for 5 days then 1 tablet in am and half in pm for 5 days then 1 tablet twice daily. Patient not taking: Reported on 02/01/2015 05/23/13   Leatha Gilding, MD  methocarbamol (ROBAXIN) 500 MG tablet Take 1 tablet (500 mg total) by mouth 2 (two) times daily. 02/01/15   Hanna Patel-Mills, PA-C  ondansetron (ZOFRAN ODT) 4 MG disintegrating tablet 4mg  ODT q4 hours prn nausea/vomit Patient not taking: Reported on 02/01/2015 08/21/14   [name], MD  ondansetron (ZOFRAN ODT) 8 MG disintegrating tablet Take 1 tablet (8 mg total) by mouth every 8 (eight) hours as needed for nausea or vomiting. Patient not taking: Reported on 02/01/2015 05/06/14   [name], MD   BP 122/80 mmHg  Pulse 98  Temp(Src) 98.8 F (37.1 C) (Oral)  Resp 18  SpO2 98% Physical Exam  Constitutional: She appears well-developed and well-nourished. No distress.  HENT:  Head: Normocephalic and atraumatic.  Right Ear: External ear normal.  Left Ear: External ear normal.  Mouth/Throat: Oropharynx is clear and moist.  Eyes: Pupils are equal, round, and reactive to light.  Neck: Normal range of motion.  Cardiovascular: Normal rate.   Nursing note and vitals reviewed.   ED Course  Procedures (including critical care time) Labs  Review Labs Reviewed  URINALYSIS, ROUTINE W REFLEX MICROSCOPIC (NOT AT [facility]) - Abnormal; Notable for the following:    Hgb urine dipstick TRACE (*)    Leukocytes, UA SMALL (*)    All other components within normal limits  URINE MICROSCOPIC-ADD ON - Abnormal; Notable for the following:    Squamous Epithelial / LPF MANY (*)    All other components within normal limits  CBG MONITORING, ED - Abnormal; Notable for the following:    Glucose-Capillary 191 (*)    All other components within normal limits  CBG MONITORING, ED - Abnormal; Notable for the following:    Glucose-Capillary 200 (*)    All other components within normal limits  I-STAT CHEM 8, ED - Abnormal; Notable for the following:  Glucose, Bld 194 (*)    All other components within normal limits  RAPID STREP SCREEN (NOT AT [facility])  CULTURE, GROUP A STREP  CBC WITH DIFFERENTIAL/PLATELET    Imaging Review No results found.   EKG Interpretation None      obtain basic labs, urine and throat culture I doubt the any of these will be positive her generalized supersensitivity appears to be supratentorial MDM   Final diagnoses:  Body aches         Earley Favor, NP 02/01/15 2015  Dione Booze, MD 02/02/15 [phone]

## 2015-02-01 NOTE — ED Notes (Addendum)
Pt reports that she recently moved, felt "like I pulled a muscle Friday" reports taking Xanax Friday night, and awoke Saturday stiff. Pt states "the only thing I've done since then is wash dishes." Pt reports tonight pain became unbearable and is "all over." Pt tearful in triage. Pt reports "this is similar to previous pancreatitis flare ups."

## 2015-02-01 NOTE — ED Notes (Signed)
Went to collect labs - pt wants to wait and see the doctor first to see if they order an IV.  RN aware.

## 2015-02-04 ENCOUNTER — Emergency Department (HOSPITAL_COMMUNITY): Payer: No Typology Code available for payment source

## 2015-02-04 ENCOUNTER — Emergency Department (HOSPITAL_COMMUNITY)
Admission: EM | Admit: 2015-02-04 | Discharge: 2015-02-04 | Disposition: A | Discharge status: Home / Self Care | Payer: No Typology Code available for payment source | Attending: Emergency Medicine | Admitting: Emergency Medicine

## 2015-02-04 ENCOUNTER — Encounter (HOSPITAL_COMMUNITY): Payer: Self-pay

## 2015-02-04 DIAGNOSIS — Z8632 Personal history of gestational diabetes: Secondary | ICD-10-CM | POA: Diagnosis not present

## 2015-02-04 DIAGNOSIS — Z3202 Encounter for pregnancy test, result negative: Secondary | ICD-10-CM | POA: Insufficient documentation

## 2015-02-04 DIAGNOSIS — R202 Paresthesia of skin: Secondary | ICD-10-CM | POA: Diagnosis not present

## 2015-02-04 DIAGNOSIS — Z794 Long term (current) use of insulin: Secondary | ICD-10-CM | POA: Insufficient documentation

## 2015-02-04 DIAGNOSIS — Z880 Allergy status to penicillin: Secondary | ICD-10-CM | POA: Insufficient documentation

## 2015-02-04 DIAGNOSIS — R200 Anesthesia of skin: Secondary | ICD-10-CM | POA: Insufficient documentation

## 2015-02-04 DIAGNOSIS — Z79899 Other long term (current) drug therapy: Secondary | ICD-10-CM | POA: Diagnosis not present

## 2015-02-04 DIAGNOSIS — F319 Bipolar disorder, unspecified: Secondary | ICD-10-CM | POA: Diagnosis not present

## 2015-02-04 DIAGNOSIS — Z720 Tobacco use: Secondary | ICD-10-CM | POA: Diagnosis not present

## 2015-02-04 DIAGNOSIS — R29898 Other symptoms and signs involving the musculoskeletal system: Secondary | ICD-10-CM

## 2015-02-04 LAB — CULTURE, GROUP A STREP

## 2015-02-04 LAB — COMPREHENSIVE METABOLIC PANEL
ALT: 34 U/L (ref 14–54)
ANION GAP: 11 (ref 5–15)
AST: 30 U/L (ref 15–41)
Albumin: 4.4 g/dL (ref 3.5–5.0)
Alkaline Phosphatase: 77 U/L (ref 38–126)
BUN: 7 mg/dL (ref 6–20)
CO2: 19 mmol/L — ABNORMAL LOW (ref 22–32)
CREATININE: 0.79 mg/dL (ref 0.44–1.00)
Calcium: 9.3 mg/dL (ref 8.9–10.3)
Chloride: 107 mmol/L (ref 101–111)
GFR calc non Af Amer: >60 mL/min (ref >60)
GLUCOSE: 245 mg/dL — AB (ref 65–99)
Potassium: 4 mmol/L (ref 3.5–5.1)
SODIUM: 137 mmol/L (ref 135–145)
Total Bilirubin: 0.4 mg/dL (ref 0.3–1.2)
Total Protein: 8.3 g/dL — ABNORMAL HIGH (ref 6.5–8.1)

## 2015-02-04 LAB — URINE MICROSCOPIC-ADD ON

## 2015-02-04 LAB — CBC WITH DIFFERENTIAL/PLATELET
BASOS ABS: 0 10*3/uL (ref 0.0–0.1)
BASOS PCT: 0 % (ref 0–1)
EOS ABS: 0.2 10*3/uL (ref 0.0–0.7)
Eosinophils Relative: 3 % (ref 0–5)
HCT: 39.8 % (ref 36.0–46.0)
Hemoglobin: 13.9 g/dL (ref 12.0–15.0)
LYMPHS ABS: 2.1 10*3/uL (ref 0.7–4.0)
LYMPHS PCT: 30 % (ref 12–46)
MCH: 29.6 pg (ref 26.0–34.0)
MCHC: 34.9 g/dL (ref 30.0–36.0)
MCV: 84.9 fL (ref 78.0–100.0)
Monocytes Absolute: 0.4 10*3/uL (ref 0.1–1.0)
Monocytes Relative: 5 % (ref 3–12)
Neutro Abs: 4.3 10*3/uL (ref 1.7–7.7)
Neutrophils Relative %: 62 % (ref 43–77)
Platelets: 226 10*3/uL (ref 150–400)
RBC: 4.69 MIL/uL (ref 3.87–5.11)
RDW: 12.8 % (ref 11.5–15.5)
WBC: 7 10*3/uL (ref 4.0–10.5)

## 2015-02-04 LAB — URINALYSIS, ROUTINE W REFLEX MICROSCOPIC
Bilirubin Urine: NEGATIVE
Glucose, UA: 500 mg/dL — AB
Hgb urine dipstick: NEGATIVE
KETONES UR: NEGATIVE mg/dL
Leukocytes, UA: NEGATIVE
Nitrite: NEGATIVE
PH: 6 (ref 5.0–8.0)
Protein, ur: 100 mg/dL — AB
SPECIFIC GRAVITY, URINE: 1.018 (ref 1.005–1.030)
Urobilinogen, UA: 0.2 mg/dL (ref 0.0–1.0)

## 2015-02-04 LAB — PREGNANCY, URINE: Preg Test, Ur: NEGATIVE

## 2015-02-04 LAB — CBG MONITORING, ED: Glucose-Capillary: 222 mg/dL — ABNORMAL HIGH (ref 65–99)

## 2015-02-04 LAB — PROTIME-INR
INR: 1.02 (ref 0.00–1.49)
Prothrombin Time: 13.6 seconds (ref 11.6–15.2)

## 2015-02-04 MED ORDER — KETOROLAC TROMETHAMINE 30 MG/ML IJ SOLN
30.0000 mg | Freq: Once | INTRAMUSCULAR | Status: AC
  Administered 2015-02-04: 30 mg via INTRAVENOUS
  Filled 2015-02-04: qty 1

## 2015-02-04 MED ORDER — GABAPENTIN 100 MG PO CAPS: 100.0000 mg | ORAL_CAPSULE | Freq: Three times a day (TID) | ORAL | Status: DC

## 2015-02-04 NOTE — ED Provider Notes (Signed)
CSN: [id number]     Arrival date & time 02/04/15  1332 History   First MD Initiated Contact with Patient 02/04/15 1457     Chief Complaint  Patient presents with  . Numbness     (Consider location/radiation/quality/duration/timing/severity/associated sxs/prior Treatment) HPI Comments: 34 year old female with past medical history including type 2 diabetes mellitus, bipolar disorder, pancreatitis he presents with right-sided numbness and pain. The patient states that several months ago, she randomly began having right arm and posterior shoulder as well as right leg pain. She took ibuprofen and it seemed to be controlled. Over the past several days, her pain has been worsening and has intermittently been throughout her entire body but focused on her right leg and arm. Pain has occasionally migrated to left arm. She reports right arm tingling and numbness as well as difficulty moving her right arm due to pain. She denies any trauma or history of injury to her neck or back. She was a value on 8/1 and was discharged home after receiving pain control but she returns today because of the persistence of her symptoms and her current concerned that something is very wrong. She has been off her insulin regimen but has been taking her oral regimen and has recently reacquired Medicaid in order to continue her insulin. She reports that her blood glucose has been under decent control. She denies any fevers, urinary symptoms, dark urine, cough/cold symptoms, sore throat, abdominal pain, headache, rashes, or recent illness.  The history is provided by the patient.    Past Medical History  Diagnosis Date  . Pancreatitis   . Bipolar 1 disorder   . Diabetes mellitus     gestational diabeties   Past Surgical History  Procedure Laterality Date  . Cholecystectomy    . Eus  08/02/2011    Procedure: UPPER ENDOSCOPIC ULTRASOUND (EUS) RADIAL;  Surgeon: Freddy Jaksch, MD;  Location: [facility];  Service: Endoscopy;   Laterality: N/A;  . Fine needle aspiration  08/02/2011    Procedure: FINE NEEDLE ASPIRATION (FNA) RADIAL;  Surgeon: Freddy Jaksch, MD;  Location: [facility];  Service: Endoscopy;  Laterality: N/A;   History reviewed. No pertinent family history. History  Substance Use Topics  . Smoking status: Current Every Day Smoker -- 0.25 packs/day    Types: Cigarettes  . Smokeless tobacco: Not on file  . Alcohol Use: No   OB History    No data available     Review of Systems  10 Systems reviewed and are negative for acute change except as noted in the HPI.   Allergies  Penicillins; Depakote er; and Lactose intolerance (gi)  Home Medications   Prior to Admission medications   Medication Sig Start Date End Date Taking? Authorizing Provider  ALPRAZolam [name]) 0.5 MG tablet Take 0.5-1 mg by mouth 3 (three) times daily as needed for anxiety.   Yes Historical Provider, MD  glimepiride (AMARYL) 2 MG tablet Take 2 mg by mouth 3 (three) times daily.   Yes Historical Provider, MD  ibuprofen (ADVIL,MOTRIN) 200 MG tablet Take 800 mg by mouth every 6 (six) hours as needed for moderate pain.    Yes Historical Provider, MD  insulin aspart (NOVOLOG) 100 UNIT/ML injection Inject 0-9 Units into the skin 3 (three) times daily with meals. CBG 70 - 120: 0 units      CBG 121 - 150: 1 unit      CBG 151 - 200: 2 units      CBG 201 - 250:  3 units      CBG 251 - 300: 4 units   CBG > 300 call MD 05/23/13  Yes Costin Otelia Sergeant, MD  methocarbamol (ROBAXIN) 500 MG tablet Take 1 tablet (500 mg total) by mouth 2 (two) times daily. Patient taking differently: Take 500 mg by mouth 2 (two) times daily. As needed 02/01/15  Yes Hanna Patel-Mills, PA-C  QUEtiapine (SEROQUEL) 400 MG tablet Take 400 mg by mouth at bedtime.   Yes Historical Provider, MD  azithromycin (ZITHROMAX) 500 MG tablet Take 1 tablet (500 mg total) by mouth daily. Patient not taking: Reported on 08/21/2014 05/23/13   Leatha Gilding, MD  cephALEXin  (KEFLEX) 500 MG capsule Take 1 capsule (500 mg total) by mouth 3 (three) times daily. Patient not taking: Reported on 08/21/2014 05/06/14   [name], MD  ciprofloxacin (CIPRO) 500 MG tablet Take 1 tablet (500 mg total) by mouth 2 (two) times daily. One po bid x 14 days Patient not taking: Reported on 02/01/2015 08/21/14   [name], MD  gabapentin (NEURONTIN) 100 MG capsule Take 1 capsule (100 mg total) by mouth 3 (three) times daily. 02/04/15   Laurence Spates, MD  metFORMIN (GLUCOPHAGE) 1000 MG tablet Take 1 tablet (1,000 mg total) by mouth 2 (two) times daily with a meal. Half a tablet twice daily for 5 days then 1 tablet in am and half in pm for 5 days then 1 tablet twice daily. Patient not taking: Reported on 02/01/2015 05/23/13   Leatha Gilding, MD  ondansetron (ZOFRAN ODT) 4 MG disintegrating tablet  ODT q4 hours prn nausea/vomit Patient not taking: Reported on 02/01/2015 08/21/14   [name], MD  ondansetron (ZOFRAN ODT) 8 MG disintegrating tablet Take 1 tablet (8 mg total) by mouth every 8 (eight) hours as needed for nausea or vomiting. Patient not taking: Reported on 02/01/2015 05/06/14   [name], MD   BP 129/93 mmHg  Pulse 85  Temp(Src) 98.1 F (36.7 C) (Oral)  Resp 16  SpO2 100% Physical Exam  Constitutional: She is oriented to person, place, and time. She appears well-developed and well-nourished. No distress.  HENT:  Head: Normocephalic and atraumatic.  Moist mucous membranes  Eyes: Conjunctivae are normal. Pupils are equal, round, and reactive to light.  Neck: Neck supple.  Cardiovascular: Normal rate, regular rhythm and normal heart sounds.   No murmur heard. Pulmonary/Chest: Effort normal and breath sounds normal.  Abdominal: Soft. Bowel sounds are normal. She exhibits no distension. There is no tenderness.  Musculoskeletal: She exhibits no edema.  R upper posterior shoulder tenderness to palpation near R paracervical spine muscles  Neurological: She is alert  and oriented to person, place, and time.  Fluent speech, 5/5 strength b/l LE, effort dependent strength in RUE but achieves 5/5 strength with coaching  Skin: Skin is warm and dry. Rash noted.  Psychiatric: She has a normal mood and affect. Judgment normal.  Nursing note and vitals reviewed.   ED Course  Procedures (including critical care time) Labs Review Labs Reviewed  COMPREHENSIVE METABOLIC PANEL - Abnormal; Notable for the following:    CO2 19 (*)    Glucose, Bld 245 (*)    Total Protein 8.3 (*)    All other components within normal limits  URINALYSIS, ROUTINE W REFLEX MICROSCOPIC (NOT AT [facility]) - Abnormal; Notable for the following:    Glucose, UA 500 (*)    Protein, ur 100 (*)    All other components within normal limits  URINE MICROSCOPIC-ADD ON - Abnormal; Notable for the following:    Squamous Epithelial / LPF FEW (*)    All other components within normal limits  CBG MONITORING, ED - Abnormal; Notable for the following:    Glucose-Capillary 222 (*)    All other components within normal limits  CBC WITH DIFFERENTIAL/PLATELET  PROTIME-INR  PREGNANCY, URINE    Imaging Review Mr Cervical Spine Wo Contrast  02/04/2015   CLINICAL DATA:  Right arm weakness.  Neck pain  EXAM: MRI CERVICAL SPINE WITHOUT CONTRAST  TECHNIQUE: Multiplanar, multisequence MR imaging of the cervical spine was performed. No intravenous contrast was administered.  COMPARISON:  None.  FINDINGS: Normal alignment with straightening of the cervical lordosis. Negative for fracture or mass. Bone marrow signal is homogeneous and appears normal. Spinal cord signal is normal. Cervical medullary junction is normal.  C2-3:  Negative  C3-4:  Negative  C4-5:  Negative  C5-6:  Small central disc bulge without spinal stenosis.  C6-7:  Small central disc bulge without significant spinal stenosis.  C7-T1:  Negative  IMPRESSION: Mild disc bulges C5-6 and C6-7 without spinal stenosis or neural impingement. Otherwise normal.    Electronically Signed   By: Marlan Palau M.D.   On: 02/04/2015 19:57     EKG Interpretation None       Medications  ketorolac (TORADOL) 30 MG/ML injection 30 mg (30 mg Intravenous Given 02/04/15 1648)     MDM   Final diagnoses:  Numbness and tingling    34 year old female who presents with ongoing right arm and leg pain and numbness that has occasionally migrated to left-sided body. Recent evaluation was unremarkable. He should well-appearing with normal vital signs. No reports of infectious symptoms. No lower bladder incontinence, saddle anesthesia, or leg weakness to suggest cauda equina. Basic lab work listed above notable for hyperglycemia at 245. As of the patient's ongoing symptoms that are unilateral and associated with right paraspinal muscle tenderness, obtained MRI of the cervical spine to rule out nerve impingement. Gave the patient Toradol for pain.  MRI showed mild disc bulging with no spinal stenosis or nerve impingement. Based on the chronicity and variability of the patient's symptoms, I do not feel that they are due to a life-threatening cause such as spinal compression. On reexamination, the patient was holding her phone and text in with her right hand without any difficulty. I suspect that she may have diabetic neuropathy given her long-standing diabetes and recent poor blood glucose control. I provided her with a short prescription for low-dose gabapentin and instructions to follow-up with her PCP next week for further evaluation. Patient already has an appointment scheduled. Cautioned about oversedation with Neurontin and other sleep aids at night. Patient voiced understanding of plan and discharged in satisfactory condition.  Laurence Spates, MD 02/04/15 2038

## 2015-02-04 NOTE — ED Notes (Signed)
Pt on stretcher with husband and four children at bedside.  Pt stated she was seen at [facility] hospital on 8/1 at which time she was unable to tolerate eve the slightest touch of a finger.  States she wanted them to tell her what is wrong with her but they did not. She was given robaxin and ibuprofen for her pain which she told me gave her minimal relief at best.  Reports she was feeling somewhat improved two nights ago but now pain is returning.  Last took prescribed meds for this issue >48 hrs ago.  Pt states her pain was lateral right hip to knee and knee/shin to two middle toes, all on right leg. Now states she can feel some pain returning down lower leg and tells me her right arm is now virtually useless to her.  Pt able to move arm, right grip only slightly less than left grip, when asked to grip tighter she was able to do so.  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

## 2015-02-04 NOTE — ED Notes (Signed)
CBG CHECKED 222

## 2015-02-04 NOTE — ED Notes (Signed)
Pt stable, ambulatory, states understanding of discharge instructions 

## 2015-02-04 NOTE — ED Notes (Signed)
Pt at MRI

## 2015-02-04 NOTE — ED Notes (Addendum)
Pt presents with 9 month h/o pain to R arm, denies any injury.  Pt reports on Sunday, she rolled over in bed with onset of "burning" pain that was generalized, seen at [facility] and discharged with robaxin which has not helped.  Pt reports her pain is 8/10, reports onset of numbness to R arm and leg that began yesterday that has been intermittent.  Pt reports she has been out of insulin, CBGs have been over 300 x 2 days.  Pt reports falling last night when R foot became numb.

## 2015-03-22 ENCOUNTER — Emergency Department (HOSPITAL_COMMUNITY): Payer: No Typology Code available for payment source

## 2015-03-22 ENCOUNTER — Encounter (HOSPITAL_COMMUNITY): Payer: Self-pay | Admitting: Emergency Medicine

## 2015-03-22 ENCOUNTER — Emergency Department (HOSPITAL_COMMUNITY)
Admission: EM | Admit: 2015-03-22 | Discharge: 2015-03-23 | Discharge status: Against Medical Advice | Payer: No Typology Code available for payment source | Attending: Emergency Medicine | Admitting: Emergency Medicine

## 2015-03-22 DIAGNOSIS — R079 Chest pain, unspecified: Secondary | ICD-10-CM | POA: Insufficient documentation

## 2015-03-22 DIAGNOSIS — E119 Type 2 diabetes mellitus without complications: Secondary | ICD-10-CM | POA: Insufficient documentation

## 2015-03-22 LAB — BASIC METABOLIC PANEL
Anion gap: 11 (ref 5–15)
BUN: 10 mg/dL (ref 6–20)
CALCIUM: 9.6 mg/dL (ref 8.9–10.3)
CO2: 18 mmol/L — ABNORMAL LOW (ref 22–32)
Chloride: 108 mmol/L (ref 101–111)
Creatinine, Ser: 0.84 mg/dL (ref 0.44–1.00)
GFR calc Af Amer: >60 mL/min (ref >60)
GFR calc non Af Amer: >60 mL/min (ref >60)
GLUCOSE: 229 mg/dL — AB (ref 65–99)
Potassium: 3.5 mmol/L (ref 3.5–5.1)
Sodium: 137 mmol/L (ref 135–145)

## 2015-03-22 LAB — CBC
HCT: 37.7 % (ref 36.0–46.0)
Hemoglobin: 12.9 g/dL (ref 12.0–15.0)
MCH: 29.7 pg (ref 26.0–34.0)
MCHC: 34.2 g/dL (ref 30.0–36.0)
MCV: 86.7 fL (ref 78.0–100.0)
Platelets: 164 10*3/uL (ref 150–400)
RBC: 4.35 MIL/uL (ref 3.87–5.11)
RDW: 12.6 % (ref 11.5–15.5)
WBC: 5.6 10*3/uL (ref 4.0–10.5)

## 2015-03-22 LAB — I-STAT TROPONIN, ED: TROPONIN I, POC: 0 ng/mL (ref 0.00–0.08)

## 2015-03-22 NOTE — ED Notes (Signed)
Patient states has had some chest tightness with shortness of breath.   Patient states she also thinks she has UTI, chest congestion, and ear pain.   Patient denies other symptoms.

## 2015-03-22 NOTE — ED Notes (Signed)
Patient stated that they could not wait any longer so patient decided to leave

## 2015-09-10 ENCOUNTER — Encounter (HOSPITAL_BASED_OUTPATIENT_CLINIC_OR_DEPARTMENT_OTHER): Payer: Self-pay | Admitting: *Deleted

## 2015-09-10 ENCOUNTER — Emergency Department (HOSPITAL_BASED_OUTPATIENT_CLINIC_OR_DEPARTMENT_OTHER): Payer: 59

## 2015-09-10 ENCOUNTER — Emergency Department (HOSPITAL_BASED_OUTPATIENT_CLINIC_OR_DEPARTMENT_OTHER)
Admission: EM | Admit: 2015-09-10 | Discharge: 2015-09-11 | Disposition: A | Discharge status: Home / Self Care | Payer: 59 | Attending: Emergency Medicine | Admitting: Emergency Medicine

## 2015-09-10 DIAGNOSIS — Z8632 Personal history of gestational diabetes: Secondary | ICD-10-CM | POA: Diagnosis not present

## 2015-09-10 DIAGNOSIS — Z794 Long term (current) use of insulin: Secondary | ICD-10-CM | POA: Insufficient documentation

## 2015-09-10 DIAGNOSIS — Z79899 Other long term (current) drug therapy: Secondary | ICD-10-CM | POA: Diagnosis not present

## 2015-09-10 DIAGNOSIS — Z880 Allergy status to penicillin: Secondary | ICD-10-CM | POA: Insufficient documentation

## 2015-09-10 DIAGNOSIS — Z3202 Encounter for pregnancy test, result negative: Secondary | ICD-10-CM | POA: Insufficient documentation

## 2015-09-10 DIAGNOSIS — Z7984 Long term (current) use of oral hypoglycemic drugs: Secondary | ICD-10-CM | POA: Diagnosis not present

## 2015-09-10 DIAGNOSIS — Z8719 Personal history of other diseases of the digestive system: Secondary | ICD-10-CM | POA: Diagnosis not present

## 2015-09-10 DIAGNOSIS — M791 Myalgia: Secondary | ICD-10-CM | POA: Diagnosis not present

## 2015-09-10 DIAGNOSIS — R05 Cough: Secondary | ICD-10-CM | POA: Insufficient documentation

## 2015-09-10 DIAGNOSIS — E860 Dehydration: Secondary | ICD-10-CM

## 2015-09-10 DIAGNOSIS — Z87891 Personal history of nicotine dependence: Secondary | ICD-10-CM | POA: Diagnosis not present

## 2015-09-10 DIAGNOSIS — R509 Fever, unspecified: Secondary | ICD-10-CM | POA: Diagnosis not present

## 2015-09-10 DIAGNOSIS — R51 Headache: Secondary | ICD-10-CM | POA: Insufficient documentation

## 2015-09-10 DIAGNOSIS — R6889 Other general symptoms and signs: Secondary | ICD-10-CM

## 2015-09-10 DIAGNOSIS — F319 Bipolar disorder, unspecified: Secondary | ICD-10-CM | POA: Diagnosis not present

## 2015-09-10 DIAGNOSIS — R519 Headache, unspecified: Secondary | ICD-10-CM

## 2015-09-10 LAB — CBG MONITORING, ED: GLUCOSE-CAPILLARY: 180 mg/dL — AB (ref 65–99)

## 2015-09-10 MED ORDER — SODIUM CHLORIDE 0.9 % IV BOLUS (SEPSIS)
1000.0000 mL | Freq: Once | INTRAVENOUS | Status: AC
  Administered 2015-09-11: 1000 mL via INTRAVENOUS

## 2015-09-10 NOTE — ED Provider Notes (Signed)
CSN: [id number]     Arrival date & time 09/10/15  2242 History   First MD Initiated Contact with Patient 09/10/15 2327     Chief Complaint  Patient presents with  . Cough  . Dizziness     (Consider location/radiation/quality/duration/timing/severity/associated sxs/prior Treatment) HPI   [name] is a(n) 35 y.o. female who presents to the ED with CC of cough, headache, fever, chills, and weaknss. She has a pmh of necrotic pancreatitis s/p cholecystectomy and has IDDM secondarily according to the patient. Her kids have had similar sxs all week. She states that she has had a cough all week that seems to be worsening. She has associated Headache, myalgias,  And fatigue.She denies cp, sob, nausea or urinary sxs.   Past Medical History  Diagnosis Date  . Pancreatitis   . Bipolar 1 disorder (HCC)   . Diabetes mellitus     gestational diabeties   Past Surgical History  Procedure Laterality Date  . Cholecystectomy    . Eus  08/02/2011    Procedure: UPPER ENDOSCOPIC ULTRASOUND (EUS) RADIAL;  Surgeon: Freddy Jaksch, MD;  Location: [facility];  Service: Endoscopy;  Laterality: N/A;  . Fine needle aspiration  08/02/2011    Procedure: FINE NEEDLE ASPIRATION (FNA) RADIAL;  Surgeon: Freddy Jaksch, MD;  Location: [facility];  Service: Endoscopy;  Laterality: N/A;   No family history on file. Social History  Substance Use Topics  . Smoking status: Former Games developer  . Smokeless tobacco: None  . Alcohol Use: No   OB History    No data available     Review of Systems Ten systems reviewed and are negative for acute change, except as noted in the HPI.    Allergies  Penicillins; Depakote er; and Lactose intolerance (gi)  Home Medications   Prior to Admission medications   Medication Sig Start Date End Date Taking? Authorizing Provider  ALPRAZolam [name]) 0.5 MG tablet Take 0.5-1 mg by mouth 3 (three) times daily as needed for anxiety.   Yes Historical Provider, MD   gabapentin (NEURONTIN) 100 MG capsule Take 1 capsule (100 mg total) by mouth 3 (three) times daily. 02/04/15  Yes Ambrose Finland Little, MD  glimepiride (AMARYL) 2 MG tablet Take 2 mg by mouth 3 (three) times daily.   Yes Historical Provider, MD  ibuprofen (ADVIL,MOTRIN) 200 MG tablet Take 800 mg by mouth every 6 (six) hours as needed for moderate pain.    Yes Historical Provider, MD  insulin detemir (LEVEMIR) 100 UNIT/ML injection Inject 20 Units into the skin at bedtime.   Yes Historical Provider, MD  insulin lispro (HUMALOG) 100 UNIT/ML injection Inject into the skin 3 (three) times daily before meals. Sliding scale   Yes Historical Provider, MD  QUEtiapine (SEROQUEL) 400 MG tablet Take 400 mg by mouth at bedtime.   Yes Historical Provider, MD  albuterol (PROVENTIL HFA;VENTOLIN HFA) 108 (90 Base) MCG/ACT inhaler Inhale 1-2 puffs into the lungs every 4 (four) hours as needed for wheezing or shortness of breath. 09/11/15   Arthor Captain, PA-C  azithromycin (ZITHROMAX) 500 MG tablet Take 1 tablet (500 mg total) by mouth daily. Patient not taking: Reported on 08/21/2014 05/23/13   Leatha Gilding, MD  cephALEXin (KEFLEX) 500 MG capsule Take 1 capsule (500 mg total) by mouth 3 (three) times daily. Patient not taking: Reported on 08/21/2014 05/06/14   [name], MD  ciprofloxacin (CIPRO) 500 MG tablet Take 1 tablet (500 mg total) by mouth 2 (two) times daily.  One po bid x 14 days Patient not taking: Reported on 02/01/2015 08/21/14   [name], MD  guaiFENesin-codeine 100-10 MG/5ML syrup Take 5-10 mLs by mouth every 6 (six) hours as needed for cough. 09/11/15   Arthor Captain, PA-C  insulin aspart (NOVOLOG) 100 UNIT/ML injection Inject 0-9 Units into the skin 3 (three) times daily with meals. CBG 70 - 120: 0 units      CBG 121 - 150: 1 unit      CBG 151 - 200: 2 units      CBG 201 - 250: 3 units      CBG 251 - 300: 4 units   CBG > 300 call MD 05/23/13   Leatha Gilding, MD  metFORMIN (GLUCOPHAGE)  1000 MG tablet Take 1 tablet (1,000 mg total) by mouth 2 (two) times daily with a meal. Half a tablet twice daily for 5 days then 1 tablet in am and half in pm for 5 days then 1 tablet twice daily. Patient not taking: Reported on 02/01/2015 05/23/13   Leatha Gilding, MD  methocarbamol (ROBAXIN) 500 MG tablet Take 1 tablet (500 mg total) by mouth 2 (two) times daily. Patient taking differently: Take 500 mg by mouth 2 (two) times daily. As needed 02/01/15   [name], PA-C  ondansetron (ZOFRAN ODT) 4 MG disintegrating tablet  ODT q4 hours prn nausea/vomit Patient not taking: Reported on 02/01/2015 08/21/14   [name], MD  ondansetron (ZOFRAN ODT) 8 MG disintegrating tablet Take 1 tablet (8 mg total) by mouth every 8 (eight) hours as needed for nausea or vomiting. Patient not taking: Reported on 02/01/2015 05/06/14   [name], MD  Spacer/Aero-Holding Chambers (AEROCHAMBER PLUS WITH MASK) inhaler Use as instructed 09/11/15   Arthor Captain, PA-C   BP 145/99 mmHg  Pulse 94  Temp(Src) 98.5 F (36.9 C) (Oral)  Resp 17  Ht  (1.575 m)  Wt 70.308 kg  BMI 28.34 kg/m2  SpO2 100% Physical Exam  Constitutional: She is oriented to person, place, and time. She appears well-developed and well-nourished. No distress.  HENT:  Head: Normocephalic and atraumatic.  Eyes: Conjunctivae are normal. No scleral icterus.  Neck: Normal range of motion.  Cardiovascular: Normal rate, regular rhythm and normal heart sounds.  Exam reveals no gallop and no friction rub.   No murmur heard. Pulmonary/Chest: Effort normal and breath sounds normal. No respiratory distress.  Abdominal: Soft. Bowel sounds are normal. She exhibits no distension and no mass. There is no tenderness. There is no guarding.  Neurological: She is alert and oriented to person, place, and time.  Skin: Skin is warm and dry. She is not diaphoretic.    ED Course  Procedures (including critical care time) Labs Review Labs Reviewed   COMPREHENSIVE METABOLIC PANEL - Abnormal; Notable for the following:    Chloride 100 (*)    Glucose, Bld 180 (*)    Creatinine, Ser 1.20 (*)    Total Protein 8.7 (*)    AST 56 (*)    Total Bilirubin 1.5 (*)    GFR calc non Af Amer 59 (*)    All other components within normal limits  URINALYSIS, ROUTINE W REFLEX MICROSCOPIC (NOT AT [facility]) - Abnormal; Notable for the following:    Protein, ur 30 (*)    All other components within normal limits  URINE MICROSCOPIC-ADD ON - Abnormal; Notable for the following:    Squamous Epithelial / LPF 0-5 (*)    Bacteria, UA RARE (*)  All other components within normal limits  CBG MONITORING, ED - Abnormal; Notable for the following:    Glucose-Capillary 180 (*)    All other components within normal limits  CBC WITH DIFFERENTIAL/PLATELET  PREGNANCY, URINE    Imaging Review No results found. I have personally reviewed and evaluated these images and lab results as part of my medical decision-making.   EKG Interpretation None      MDM   Final diagnoses:  Flu-like symptoms  Dehydration  Bad headache   Pt CXR negative for acute infiltrate. Patients symptoms are consistent with URI, likely viral etiology. Discussed that antibiotics are not indicated for viral infections. Pt will be discharged with symptomatic treatment.  Verbalizes understanding and is agreeable with plan. Pt is hemodynamically stable & in NAD prior to dc.  Pt HA treated and improved while in ED.  Presentation is like pts typical HA and non concerning [facility]CH, Meningitis, or temporal arteritis. Pt is afebrile with no focal neuro deficits, nuchal rigidity, or change in vision. Pt is to follow up with PCP to discuss prophylactic medication. Pt verbalizes understanding and is agreeable with plan to dc.      Arthor Captainbigail Aidynn Krenn, PA-C 09/15/15 2032  Shon Batonourtney F Horton, MD 09/20/15 [phone]

## 2015-09-10 NOTE — ED Notes (Signed)
CBG is 180. 

## 2015-09-10 NOTE — ED Notes (Signed)
Pt c/o generalized worsening URI symptoms, whole house has the flu.  Also a rash on both sides of neck for the last two days.

## 2015-09-10 NOTE — ED Notes (Signed)
Dry cough x 1 week. Dizzy x 2 days. Reports fatigue and feeling lightheaded

## 2015-09-11 LAB — COMPREHENSIVE METABOLIC PANEL
ALBUMIN: 4 g/dL (ref 3.5–5.0)
ALT: 42 U/L (ref 14–54)
AST: 56 U/L — AB (ref 15–41)
Alkaline Phosphatase: 83 U/L (ref 38–126)
Anion gap: 12 (ref 5–15)
BILIRUBIN TOTAL: 1.5 mg/dL — AB (ref 0.3–1.2)
BUN: 16 mg/dL (ref 6–20)
CHLORIDE: 100 mmol/L — AB (ref 101–111)
CO2: 25 mmol/L (ref 22–32)
CREATININE: 1.2 mg/dL — AB (ref 0.44–1.00)
Calcium: 9.2 mg/dL (ref 8.9–10.3)
GFR calc Af Amer: >60 mL/min (ref >60)
GFR, EST NON AFRICAN AMERICAN: 59 mL/min — AB (ref >60)
GLUCOSE: 180 mg/dL — AB (ref 65–99)
POTASSIUM: 4.5 mmol/L (ref 3.5–5.1)
Sodium: 137 mmol/L (ref 135–145)
Total Protein: 8.7 g/dL — ABNORMAL HIGH (ref 6.5–8.1)

## 2015-09-11 LAB — URINALYSIS, ROUTINE W REFLEX MICROSCOPIC
BILIRUBIN URINE: NEGATIVE
GLUCOSE, UA: NEGATIVE mg/dL
HGB URINE DIPSTICK: NEGATIVE
KETONES UR: NEGATIVE mg/dL
Leukocytes, UA: NEGATIVE
Nitrite: NEGATIVE
PROTEIN: 30 mg/dL — AB
Specific Gravity, Urine: 1.021 (ref 1.005–1.030)
pH: 7 (ref 5.0–8.0)

## 2015-09-11 LAB — CBC WITH DIFFERENTIAL/PLATELET
Basophils Absolute: 0 10*3/uL (ref 0.0–0.1)
Basophils Relative: 0 %
EOS ABS: 0.2 10*3/uL (ref 0.0–0.7)
EOS PCT: 2 %
HCT: 37.5 % (ref 36.0–46.0)
Hemoglobin: 12.9 g/dL (ref 12.0–15.0)
LYMPHS ABS: 2.7 10*3/uL (ref 0.7–4.0)
LYMPHS PCT: 28 %
MCH: 29.9 pg (ref 26.0–34.0)
MCHC: 34.4 g/dL (ref 30.0–36.0)
MCV: 86.8 fL (ref 78.0–100.0)
MONO ABS: 0.6 10*3/uL (ref 0.1–1.0)
Monocytes Relative: 7 %
Neutro Abs: 6.1 10*3/uL (ref 1.7–7.7)
Neutrophils Relative %: 63 %
PLATELETS: 219 10*3/uL (ref 150–400)
RBC: 4.32 MIL/uL (ref 3.87–5.11)
RDW: 12.9 % (ref 11.5–15.5)
WBC: 9.7 10*3/uL (ref 4.0–10.5)

## 2015-09-11 LAB — URINE MICROSCOPIC-ADD ON

## 2015-09-11 LAB — PREGNANCY, URINE: PREG TEST UR: NEGATIVE

## 2015-09-11 MED ORDER — KETOROLAC TROMETHAMINE 30 MG/ML IJ SOLN
30.0000 mg | Freq: Once | INTRAMUSCULAR | Status: AC
  Administered 2015-09-11: 30 mg via INTRAVENOUS
  Filled 2015-09-11: qty 1

## 2015-09-11 MED ORDER — GUAIFENESIN-CODEINE 100-10 MG/5ML PO SOLN: 5.0000 mL | Freq: Four times a day (QID) | ORAL | Status: DC | PRN

## 2015-09-11 MED ORDER — DIPHENHYDRAMINE HCL 50 MG/ML IJ SOLN
25.0000 mg | Freq: Once | INTRAMUSCULAR | Status: AC
  Administered 2015-09-11: 25 mg via INTRAVENOUS
  Filled 2015-09-11: qty 1

## 2015-09-11 MED ORDER — AEROCHAMBER PLUS W/MASK MISC: Status: DC

## 2015-09-11 MED ORDER — SODIUM CHLORIDE 0.9 % IV BOLUS (SEPSIS): 1000.0000 mL | Freq: Once | INTRAVENOUS | Status: DC

## 2015-09-11 MED ORDER — PROCHLORPERAZINE EDISYLATE 5 MG/ML IJ SOLN
5.0000 mg | Freq: Once | INTRAMUSCULAR | Status: AC
  Administered 2015-09-11: 5 mg via INTRAVENOUS
  Filled 2015-09-11: qty 2

## 2015-09-11 MED ORDER — ALBUTEROL SULFATE HFA 108 (90 BASE) MCG/ACT IN AERS: 1.0000 | INHALATION_SPRAY | RESPIRATORY_TRACT | Status: DC | PRN

## 2015-09-11 NOTE — ED Notes (Signed)
Pt verbalizes understanding of d/c instructions and denies any further needs at this time. 

## 2015-09-11 NOTE — Discharge Instructions (Signed)
Influenza, Adult Influenza ("the flu") is a viral infection of the respiratory tract. It occurs more often in winter months because people spend more time in close contact with one another. Influenza can make you feel very sick. Influenza easily spreads from person to person (contagious). CAUSES  Influenza is caused by a virus that infects the respiratory tract. You can catch the virus by breathing in droplets from an infected person's cough or sneeze. You can also catch the virus by touching something that was recently contaminated with the virus and then touching your mouth, nose, or eyes. RISKS AND COMPLICATIONS You may be at risk for a more severe case of influenza if you smoke cigarettes, have diabetes, have chronic heart disease (such as heart failure) or lung disease (such as asthma), or if you have a weakened immune system. Elderly people and pregnant women are also at risk for more serious infections. The most common problem of influenza is a lung infection (pneumonia). Sometimes, this problem can require emergency medical care and may be life threatening. SIGNS AND SYMPTOMS  Symptoms typically last 4 to 10 days and may include:  Fever.  Chills.  Headache, body aches, and muscle aches.  Sore throat.  Chest discomfort and cough.  Poor appetite.  Weakness or feeling tired.  Dizziness.  Nausea or vomiting. DIAGNOSIS  Diagnosis of influenza is often made based on your history and a physical exam. A nose or throat swab test can be done to confirm the diagnosis. TREATMENT  In mild cases, influenza goes away on its own. Treatment is directed at relieving symptoms. For more severe cases, your health care provider may prescribe antiviral medicines to shorten the sickness. Antibiotic medicines are not effective because the infection is caused by a virus, not by bacteria. HOME CARE INSTRUCTIONS  Take medicines only as directed by your health care provider.  Use a cool mist humidifier  to make breathing easier.  Get plenty of rest until your temperature returns to normal. This usually takes 3 to 4 days.  Drink enough fluid to keep your urine clear or pale yellow.  Cover yourmouth and nosewhen coughing or sneezing,and wash your handswellto prevent thevirusfrom spreading.  Stay homefromwork orschool untilthe fever is gonefor at least 95full day. PREVENTION  An annual influenza vaccination (flu shot) is the best way to avoid getting influenza. An annual flu shot is now routinely recommended for all adults in the [place] SEEK MEDICAL CARE IF:  You experiencechest pain, yourcough worsens,or you producemore mucus.  Youhave nausea,vomiting, ordiarrhea.  Your fever returns or gets worse. SEEK IMMEDIATE MEDICAL CARE IF:   Dehydration, Adult Dehydration is a condition in which you do not have enough fluid or water in your body. It happens when you take in less fluid than you lose. Vital organs such as the kidneys, brain, and heart cannot function without a proper amount of fluids. Any loss of fluids from the body can cause dehydration.  Dehydration can range from mild to severe. This condition should be treated right away to help prevent it from becoming severe. CAUSES  This condition may be caused by: Vomiting. Diarrhea. Excessive sweating, such as when exercising in hot or humid weather. Not drinking enough fluid during strenuous exercise or during an illness. Excessive urine output. Fever. Certain medicines. RISK FACTORS This condition is more likely to develop in: People who are taking certain medicines that cause the body to lose excess fluid (diuretics).  People who have a chronic illness, such as diabetes, that may  increase urination. Older adults.  People who live at high altitudes.  People who participate in endurance sports.  SYMPTOMS  Mild Dehydration Thirst. Dry lips. Slightly dry mouth. Dry, warm skin. Moderate Dehydration Very  dry mouth.  Muscle cramps.  Dark urine and decreased urine production.  Decreased tear production.  Headache.  Light-headedness, especially when you stand up from a sitting position.  Severe Dehydration Changes in skin.  Cold and clammy skin.  Skin does not spring back quickly when lightly pinched and released.  Changes in body fluids.  Extreme thirst.  No tears.  Not able to sweat when body temperature is high, such as in hot weather.  Minimal urine production.  Changes in vital signs.  Rapid, weak pulse (more than 100 beats per minute when you are sitting still).  Rapid breathing.  Low blood pressure.  Other changes.  Sunken eyes.  Cold hands and feet.  Confusion. Lethargy and difficulty being awakened. Fainting (syncope).  Short-term weight loss.  Unconsciousness. DIAGNOSIS  This condition may be diagnosed based on your symptoms. You may also have tests to determine how severe your dehydration is. These tests may include:  Urine tests.  Blood tests.  TREATMENT  Treatment for this condition depends on the severity. Mild or moderate dehydration can often be treated at home. Treatment should be started right away. Do not wait until dehydration becomes severe. Severe dehydration needs to be treated at the hospital. Treatment for Mild Dehydration Drinking plenty of water to replace the fluid you have lost.  Replacing minerals in your blood (electrolytes) that you may have lost.  Treatment for Moderate Dehydration Consuming oral rehydration solution (ORS). Treatment for Severe Dehydration Receiving fluid through an IV tube.  Receiving electrolyte solution through a feeding tube that is passed through your nose and into your stomach (nasogastric tube or NG tube). Correcting any abnormalities in electrolytes. HOME CARE INSTRUCTIONS  Drink enough fluid to keep your urine clear or pale yellow.  Drink water or fluid slowly by taking small sips. You  can also try sucking on ice cubes. Have food or beverages that contain electrolytes. Examples include bananas and sports drinks. Take over-the-counter and prescription medicines only as told by your health care provider.  Prepare ORS according to the manufacturer's instructions. Take sips of ORS every 5 minutes until your urine returns to normal. If you have vomiting or diarrhea, continue to try to drink water, ORS, or both.  If you have diarrhea, avoid:  Beverages that contain caffeine.  Fruit juice.  Milk.  Carbonated soft drinks. Do not take salt tablets. This can lead to the condition of having too much sodium in your body (hypernatremia).  SEEK MEDICAL CARE IF: You cannot eat or drink without vomiting. You have had moderate diarrhea during a period of more than 24 hours. You have a fever. SEEK IMMEDIATE MEDICAL CARE IF:  You have extreme thirst. You have severe diarrhea. You have not urinated in 6-8 hours, or you have urinated only a small amount of very dark urine. You have shriveled skin. You are dizzy, confused, or both.   This information is not intended to replace advice given to you by your health care provider. Make sure you discuss any questions you have with your health care provider.   Document Released: 06/19/2005 Document Revised: 03/10/2015 Document Reviewed: 11/04/2014 Elsevier Interactive Patient Education 2016 [employer]  You havetrouble breathing, you become short of breath,or your skin ornails becomebluish.  You have severe painor stiffnessin the neck.  You develop a sudden headache, or pain in the face or ear.  You have nausea or vomiting that you cannot control. MAKE SURE YOU:   Understand these instructions.  Will watch your condition.  Will get help right away if you are not doing well or get worse.   This information is not intended to replace advice given to you by your health care provider. Make sure you discuss any questions  you have with your health care provider.   Document Released: 06/16/2000 Document Revised: 07/10/2014 Document Reviewed: 09/18/2011 Elsevier Interactive Patient Education 2016 [employer] You are having a headache. No specific cause was found today for your headache. It may have been a migraine or other cause of headache. Stress, anxiety, fatigue, and depression are common triggers for headaches. Your headache today does not appear to be life-threatening or require hospitalization, but often the exact cause of headaches is not determined in the emergency department. Therefore, follow-up with your doctor is very important to find out what may have caused your headache, and whether or not you need any further diagnostic testing or treatment. Sometimes headaches can appear benign (not harmful), but then more serious symptoms can develop which should prompt an immediate re-evaluation by your doctor or the emergency department. SEEK MEDICAL ATTENTION IF: You develop possible problems with medications prescribed.  The medications don't resolve your headache, if it recurs , or if you have multiple episodes of vomiting or can't take fluids. You have a change from the usual headache. RETURN IMMEDIATELY IF you develop a sudden, severe headache or confusion, become poorly responsive or faint, develop a fever above 100.43F or problem breathing, have a change in speech, vision, swallowing, or understanding, or develop new weakness, numbness, tingling, incoordination, or have a seizure.

## 2015-12-20 ENCOUNTER — Emergency Department (HOSPITAL_BASED_OUTPATIENT_CLINIC_OR_DEPARTMENT_OTHER)
Admission: EM | Admit: 2015-12-20 | Discharge: 2015-12-20 | Disposition: A | Discharge status: Home / Self Care | Payer: Medicaid Other | Attending: Emergency Medicine | Admitting: Emergency Medicine

## 2015-12-20 ENCOUNTER — Encounter (HOSPITAL_BASED_OUTPATIENT_CLINIC_OR_DEPARTMENT_OTHER): Payer: Self-pay | Admitting: *Deleted

## 2015-12-20 DIAGNOSIS — Z79899 Other long term (current) drug therapy: Secondary | ICD-10-CM | POA: Diagnosis not present

## 2015-12-20 DIAGNOSIS — G44209 Tension-type headache, unspecified, not intractable: Secondary | ICD-10-CM | POA: Insufficient documentation

## 2015-12-20 DIAGNOSIS — Z7984 Long term (current) use of oral hypoglycemic drugs: Secondary | ICD-10-CM | POA: Diagnosis not present

## 2015-12-20 DIAGNOSIS — E119 Type 2 diabetes mellitus without complications: Secondary | ICD-10-CM | POA: Diagnosis not present

## 2015-12-20 DIAGNOSIS — F319 Bipolar disorder, unspecified: Secondary | ICD-10-CM | POA: Diagnosis not present

## 2015-12-20 DIAGNOSIS — R197 Diarrhea, unspecified: Secondary | ICD-10-CM | POA: Diagnosis not present

## 2015-12-20 DIAGNOSIS — Z87891 Personal history of nicotine dependence: Secondary | ICD-10-CM | POA: Insufficient documentation

## 2015-12-20 DIAGNOSIS — R1110 Vomiting, unspecified: Secondary | ICD-10-CM | POA: Insufficient documentation

## 2015-12-20 DIAGNOSIS — Z794 Long term (current) use of insulin: Secondary | ICD-10-CM | POA: Insufficient documentation

## 2015-12-20 DIAGNOSIS — Z791 Long term (current) use of non-steroidal anti-inflammatories (NSAID): Secondary | ICD-10-CM | POA: Insufficient documentation

## 2015-12-20 DIAGNOSIS — R51 Headache: Secondary | ICD-10-CM | POA: Diagnosis present

## 2015-12-20 LAB — CBC WITH DIFFERENTIAL/PLATELET
BASOS PCT: 0 %
Basophils Absolute: 0 10*3/uL (ref 0.0–0.1)
EOS ABS: 0.1 10*3/uL (ref 0.0–0.7)
EOS PCT: 2 %
HCT: 39.7 % (ref 36.0–46.0)
Hemoglobin: 13.4 g/dL (ref 12.0–15.0)
LYMPHS ABS: 2.2 10*3/uL (ref 0.7–4.0)
Lymphocytes Relative: 33 %
MCH: 29.8 pg (ref 26.0–34.0)
MCHC: 33.8 g/dL (ref 30.0–36.0)
MCV: 88.2 fL (ref 78.0–100.0)
Monocytes Absolute: 0.4 10*3/uL (ref 0.1–1.0)
Monocytes Relative: 6 %
Neutro Abs: 4 10*3/uL (ref 1.7–7.7)
Neutrophils Relative %: 59 %
PLATELETS: 226 10*3/uL (ref 150–400)
RBC: 4.5 MIL/uL (ref 3.87–5.11)
RDW: 13.1 % (ref 11.5–15.5)
WBC: 6.6 10*3/uL (ref 4.0–10.5)

## 2015-12-20 LAB — I-STAT VENOUS BLOOD GAS, ED
Acid-Base Excess: 2 mmol/L (ref 0.0–2.0)
Bicarbonate: 26.5 mEq/L — ABNORMAL HIGH (ref 20.0–24.0)
O2 SAT: 55 %
PH VEN: 7.41 — AB (ref 7.250–7.300)
TCO2: 28 mmol/L (ref 0–100)
pCO2, Ven: 41.8 mmHg — ABNORMAL LOW (ref 45.0–50.0)
pO2, Ven: 29 mmHg — ABNORMAL LOW (ref 31.0–45.0)

## 2015-12-20 LAB — BASIC METABOLIC PANEL
Anion gap: 9 (ref 5–15)
BUN: 10 mg/dL (ref 6–20)
CO2: 26 mmol/L (ref 22–32)
CREATININE: 0.7 mg/dL (ref 0.44–1.00)
Calcium: 9.6 mg/dL (ref 8.9–10.3)
Chloride: 104 mmol/L (ref 101–111)
Glucose, Bld: 141 mg/dL — ABNORMAL HIGH (ref 65–99)
POTASSIUM: 3.8 mmol/L (ref 3.5–5.1)
SODIUM: 139 mmol/L (ref 135–145)

## 2015-12-20 LAB — PREGNANCY, URINE: Preg Test, Ur: NEGATIVE

## 2015-12-20 LAB — URINALYSIS, ROUTINE W REFLEX MICROSCOPIC
BILIRUBIN URINE: NEGATIVE
GLUCOSE, UA: NEGATIVE mg/dL
HGB URINE DIPSTICK: NEGATIVE
KETONES UR: NEGATIVE mg/dL
Leukocytes, UA: NEGATIVE
Nitrite: NEGATIVE
PROTEIN: NEGATIVE mg/dL
Specific Gravity, Urine: 1.012 (ref 1.005–1.030)
pH: 7 (ref 5.0–8.0)

## 2015-12-20 LAB — CBG MONITORING, ED: Glucose-Capillary: 154 mg/dL — ABNORMAL HIGH (ref 65–99)

## 2015-12-20 MED ORDER — DIPHENHYDRAMINE HCL 50 MG/ML IJ SOLN
25.0000 mg | Freq: Once | INTRAMUSCULAR | Status: AC
  Administered 2015-12-20: 25 mg via INTRAVENOUS
  Filled 2015-12-20: qty 1

## 2015-12-20 MED ORDER — SODIUM CHLORIDE 0.9 % IV BOLUS (SEPSIS)
1000.0000 mL | Freq: Once | INTRAVENOUS | Status: AC
  Administered 2015-12-20: 1000 mL via INTRAVENOUS

## 2015-12-20 MED ORDER — METOCLOPRAMIDE HCL 5 MG/ML IJ SOLN
10.0000 mg | Freq: Once | INTRAMUSCULAR | Status: AC
  Administered 2015-12-20: 10 mg via INTRAVENOUS
  Filled 2015-12-20: qty 2

## 2015-12-20 MED ORDER — KETOROLAC TROMETHAMINE 30 MG/ML IJ SOLN
30.0000 mg | Freq: Once | INTRAMUSCULAR | Status: AC
  Administered 2015-12-20: 30 mg via INTRAVENOUS
  Filled 2015-12-20: qty 1

## 2015-12-20 NOTE — ED Notes (Signed)
Headache x 5 days. Not sleeping or eating due to extreme pain.

## 2015-12-20 NOTE — ED Notes (Signed)
Pt verbalizes understanding of d/c instructions and denies any further needs at this time. 

## 2015-12-20 NOTE — ED Provider Notes (Signed)
CSN: [id number]     Arrival date & time 12/20/15  1834 History  By signing my name below, I, [name], attest that this documentation has been prepared under the direction and in the presence of [name], MD . Electronically Signed: Marisue HumbleMichelle Ortiz, Scribe. 12/20/2015. 8:09 PM.   Chief Complaint  Patient presents with  . Headache   The history is provided by the patient. No language interpreter was used.   HPI Comments:  [name] is a 35 y.o. female with PMHx of type 2 DM and pancreatitis who presents to the Emergency Department complaining of worsening headache onset 7-10 days ago. Tylenol provided temporary relief. Pt reports associated vomiting and diarrhea for the past 2 days. She states her sugars are "out of control"; fasting blood sugars are over 300 while taking all medications. Pt reports she has tried to see her PCP and they have repeatedly cancelled her appointments. Denies h/o DKA.  Past Medical History  Diagnosis Date  . Pancreatitis   . Bipolar 1 disorder (HCC)   . Diabetes mellitus     gestational diabeties   Past Surgical History  Procedure Laterality Date  . Cholecystectomy    . Eus  08/02/2011    Procedure: UPPER ENDOSCOPIC ULTRASOUND (EUS) RADIAL;  Surgeon: [name], MD;  Location: [facility];  Service: Endoscopy;  Laterality: N/A;  . Fine needle aspiration  08/02/2011    Procedure: FINE NEEDLE ASPIRATION (FNA) RADIAL;  Surgeon: [name], MD;  Location: [facility];  Service: Endoscopy;  Laterality: N/A;   No family history on file. Social History  Substance Use Topics  . Smoking status: Former Games developermoker  . Smokeless tobacco: None  . Alcohol Use: No   OB History    No data available     Review of Systems  Gastrointestinal: Positive for vomiting and diarrhea.  Neurological: Positive for headaches.  All other systems reviewed and are negative.  Allergies  Penicillins; Depakote er; and Lactose intolerance (gi)  Home  Medications   Prior to Admission medications   Medication Sig Start Date End Date Taking? Authorizing Provider  ALPRAZolam [name](XANAX) 0.5 MG tablet Take 0.5-1 mg by mouth 3 (three) times daily as needed for anxiety.   Yes Historical Provider, MD  gabapentin (NEURONTIN) 100 MG capsule Take 1 capsule (100 mg total) by mouth 3 (three) times daily. 02/04/15  Yes [name], MD  glimepiride (AMARYL) 2 MG tablet Take 2 mg by mouth 3 (three) times daily.   Yes Historical Provider, MD  ibuprofen (ADVIL,MOTRIN) 200 MG tablet Take 800 mg by mouth every 6 (six) hours as needed for moderate pain.    Yes Historical Provider, MD  insulin detemir (LEVEMIR) 100 UNIT/ML injection Inject 20 Units into the skin at bedtime.   Yes Historical Provider, MD  insulin lispro (HUMALOG) 100 UNIT/ML injection Inject into the skin 3 (three) times daily before meals. Sliding scale   Yes Historical Provider, MD  albuterol (PROVENTIL HFA;VENTOLIN HFA) 108 (90 Base) MCG/ACT inhaler Inhale 1-2 puffs into the lungs every 4 (four) hours as needed for wheezing or shortness of breath. 09/11/15   Arthor CaptainAbigail Harris, PA-C  azithromycin (ZITHROMAX) 500 MG tablet Take 1 tablet (500 mg total) by mouth daily. Patient not taking: Reported on 08/21/2014 05/23/13   [name], MD  cephALEXin (KEFLEX) 500 MG capsule Take 1 capsule (500 mg total) by mouth 3 (three) times daily. Patient not taking: Reported on 08/21/2014 05/06/14   [name], MD  ciprofloxacin (CIPRO) 500 MG tablet Take 1 tablet (500 mg total) by mouth 2 (two) times daily. One po bid x 14 days Patient not taking: Reported on 02/01/2015 08/21/14   [name], MD  guaiFENesin-codeine 100-10 MG/5ML syrup Take 5-10 mLs by mouth every 6 (six) hours as needed for cough. 09/11/15   Arthor Captain, PA-C  insulin aspart (NOVOLOG) 100 UNIT/ML injection Inject 0-9 Units into the skin 3 (three) times daily with meals. CBG 70 - 120: 0 units      CBG 121 - 150: 1 unit      CBG 151 - 200: 2  units      CBG 201 - 250: 3 units      CBG 251 - 300: 4 units   CBG > 300 call MD 05/23/13   Leatha Gilding, MD  metFORMIN (GLUCOPHAGE) 1000 MG tablet Take 1 tablet (1,000 mg total) by mouth 2 (two) times daily with a meal. Half a tablet twice daily for 5 days then 1 tablet in am and half in pm for 5 days then 1 tablet twice daily. Patient not taking: Reported on 02/01/2015 05/23/13   Leatha Gilding, MD  methocarbamol (ROBAXIN) 500 MG tablet Take 1 tablet (500 mg total) by mouth 2 (two) times daily. Patient taking differently: Take 500 mg by mouth 2 (two) times daily. As needed 02/01/15   [name], PA-C  ondansetron (ZOFRAN ODT) 4 MG disintegrating tablet  ODT q4 hours prn nausea/vomit Patient not taking: Reported on 02/01/2015 08/21/14   [name], MD  ondansetron (ZOFRAN ODT) 8 MG disintegrating tablet Take 1 tablet (8 mg total) by mouth every 8 (eight) hours as needed for nausea or vomiting. Patient not taking: Reported on 02/01/2015 05/06/14   [name], MD  QUEtiapine (SEROQUEL) 400 MG tablet Take 400 mg by mouth at bedtime.    Historical Provider, MD  Spacer/Aero-Holding Chambers (AEROCHAMBER PLUS WITH MASK) inhaler Use as instructed 09/11/15   Arthor Captain, PA-C   BP 142/98 mmHg  Pulse 64  Temp(Src) 98.8 F (37.1 C) (Oral)  Resp 18  Ht  (1.575 m)  Wt 146 lb (66.225 kg)  BMI 26.70 kg/m2  SpO2 100%   Physical Exam  Constitutional: She is oriented to person, place, and time. She appears well-developed and well-nourished. No distress.  HENT:  Head: Normocephalic.  Eyes: Conjunctivae are normal.  Neck: Neck supple. No tracheal deviation present.  Cardiovascular: Normal rate, regular rhythm, S1 normal, S2 normal and normal heart sounds.  Exam reveals no gallop and no friction rub.   No murmur heard. Pulmonary/Chest: Effort normal. No respiratory distress.  Abdominal: Soft. She exhibits no distension.  Neurological: She is alert and oriented to person, place, and  time. No cranial nerve deficit.  Normal finger to nose test; normal rapid alternating movement  Skin: Skin is warm and dry.  Psychiatric: She has a normal mood and affect.    ED Course  Procedures  DIAGNOSTIC STUDIES:  Oxygen Saturation is 100% on RA, normal by my interpretation.    COORDINATION OF CARE:  8:06 PM Will order CBC, BMP, UA, venous blood gas and POC CBG. Will administer fluids and migraine cocktail. Discussed treatment plan with pt at bedside and pt agreed to plan.  Labs Review Labs Reviewed  BASIC METABOLIC PANEL - Abnormal; Notable for the following:    Glucose, Bld 141 (*)    All other components within normal limits  CBG MONITORING, ED - Abnormal; Notable for the following:  Glucose-Capillary 154 (*)    All other components within normal limits  I-STAT VENOUS BLOOD GAS, ED - Abnormal; Notable for the following:    pH, Ven 7.410 (*)    pCO2, Ven 41.8 (*)    pO2, Ven 29.0 (*)    Bicarbonate 26.5 (*)    All other components within normal limits  CBC WITH DIFFERENTIAL/PLATELET  URINALYSIS, ROUTINE W REFLEX MICROSCOPIC (NOT AT [facility])  PREGNANCY, URINE    Imaging Review No results found. I have personally reviewed and evaluated these images and lab results as part of my medical decision-making.   EKG Interpretation None      MDM   Final diagnoses:  Tension headache    35 y.o. female presents with ongoing headache for several days with gradual onset. Starts at neck and moves forward c/w tension HA. She tells me her resting blood sugars have been 300-400 routinely despite insulin therapy and has had some N/V/D. No signs of DKA. Pain improved after rglan/benadryl. Plan to follow up with PCP as needed and return precautions discussed for worsening or new concerning symptoms.   I personally performed the services described in this documentation, which was scribed in my presence. The recorded information has been reviewed and is accurate.    Lyndal Pulley,  MD 12/21/15 [phone]

## 2015-12-20 NOTE — Discharge Instructions (Signed)
Tension Headache A tension headache is a feeling of pain, pressure, or aching that is often felt over the front and sides of the head. The pain can be dull, or it can feel tight (constricting). Tension headaches are not normally associated with nausea or vomiting, and they do not get worse with physical activity. Tension headaches can last from 30 minutes to several days. This is the most common type of headache. CAUSES The exact cause of this condition is not known. Tension headaches often begin after stress, anxiety, or depression. Other triggers may include:  Alcohol.  Too much caffeine, or caffeine withdrawal.  Respiratory infections, such as colds, flu, or sinus infections.  Dental problems or teeth clenching.  Fatigue.  Holding your head and neck in the same position for a long period of time, such as while using a computer.  Smoking. SYMPTOMS Symptoms of this condition include:  A feeling of pressure around the head.  Dull, aching head pain.  Pain felt over the front and sides of the head.  Tenderness in the muscles of the head, neck, and shoulders. DIAGNOSIS This condition may be diagnosed based on your symptoms and a physical exam. Tests may be done, such as a CT scan or an MRI of your head. These tests may be done if your symptoms are severe or unusual. TREATMENT This condition may be treated with lifestyle changes and medicines to help relieve symptoms. HOME CARE INSTRUCTIONS Managing Pain  Take over-the-counter and prescription medicines only as told by your health care provider.  Lie down in a dark, quiet room when you have a headache.  If directed, apply ice to the head and neck area:  Put ice in a plastic bag.  Place a towel between your skin and the bag.  Leave the ice on for 20 minutes, 2-3 times per day.  Use a heating pad or a hot shower to apply heat to the head and neck area as told by your health care provider. Eating and Drinking  Eat meals on  a regular schedule.  Limit alcohol use.  Decrease your caffeine intake, or stop using caffeine. General Instructions  Keep all follow-up visits as told by your health care provider. This is important.  Keep a headache journal to help find out what may trigger your headaches. For example, write down:  What you eat and drink.  How much sleep you get.  Any change to your diet or medicines.  Try massage or other relaxation techniques.  Limit stress.  Sit up straight, and avoid tensing your muscles.  Do not use tobacco products, including cigarettes, chewing tobacco, or e-cigarettes. If you need help quitting, ask your health care provider.  Exercise regularly as told by your health care provider.  Get 7-9 hours of sleep, or the amount recommended by your health care provider. SEEK MEDICAL CARE IF:  Your symptoms are not helped by medicine.  You have a headache that is different from what you normally experience.  You have nausea or you vomit.  You have a fever. SEEK IMMEDIATE MEDICAL CARE IF:  Your headache becomes severe.  You have repeated vomiting.  You have a stiff neck.  You have a loss of vision.  You have problems with speech.  You have pain in your eye or ear.  You have muscular weakness or loss of muscle control.  You lose your balance or you have trouble walking.  You feel faint or you pass out.  You have confusion.     This information is not intended to replace advice given to you by your health care provider. Make sure you discuss any questions you have with your health care provider.   Document Released: 06/19/2005 Document Revised: 03/10/2015 Document Reviewed: 10/12/2014 Elsevier Interactive Patient Education 2016 [employer]  

## 2016-01-13 ENCOUNTER — Emergency Department (HOSPITAL_BASED_OUTPATIENT_CLINIC_OR_DEPARTMENT_OTHER)
Admission: EM | Admit: 2016-01-13 | Discharge: 2016-01-13 | Disposition: A | Discharge status: Home / Self Care | Payer: Medicaid Other | Attending: Physician Assistant | Admitting: Physician Assistant

## 2016-01-13 ENCOUNTER — Encounter (HOSPITAL_BASED_OUTPATIENT_CLINIC_OR_DEPARTMENT_OTHER): Payer: Self-pay | Admitting: *Deleted

## 2016-01-13 DIAGNOSIS — E119 Type 2 diabetes mellitus without complications: Secondary | ICD-10-CM | POA: Diagnosis not present

## 2016-01-13 DIAGNOSIS — Z794 Long term (current) use of insulin: Secondary | ICD-10-CM | POA: Insufficient documentation

## 2016-01-13 DIAGNOSIS — Z7984 Long term (current) use of oral hypoglycemic drugs: Secondary | ICD-10-CM | POA: Insufficient documentation

## 2016-01-13 DIAGNOSIS — R51 Headache: Secondary | ICD-10-CM | POA: Diagnosis not present

## 2016-01-13 DIAGNOSIS — Y92410 Unspecified street and highway as the place of occurrence of the external cause: Secondary | ICD-10-CM | POA: Diagnosis not present

## 2016-01-13 DIAGNOSIS — Y999 Unspecified external cause status: Secondary | ICD-10-CM | POA: Insufficient documentation

## 2016-01-13 DIAGNOSIS — M545 Low back pain: Secondary | ICD-10-CM | POA: Diagnosis present

## 2016-01-13 DIAGNOSIS — F319 Bipolar disorder, unspecified: Secondary | ICD-10-CM | POA: Diagnosis not present

## 2016-01-13 DIAGNOSIS — Y9389 Activity, other specified: Secondary | ICD-10-CM | POA: Insufficient documentation

## 2016-01-13 DIAGNOSIS — Z87891 Personal history of nicotine dependence: Secondary | ICD-10-CM | POA: Insufficient documentation
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
## 2016-04-10 ENCOUNTER — Emergency Department (HOSPITAL_BASED_OUTPATIENT_CLINIC_OR_DEPARTMENT_OTHER)
Admission: EM | Admit: 2016-04-10 | Discharge: 2016-04-10 | Disposition: A | Discharge status: Home / Self Care | Payer: Medicaid Other | Attending: Emergency Medicine | Admitting: Emergency Medicine

## 2016-04-10 ENCOUNTER — Encounter (HOSPITAL_BASED_OUTPATIENT_CLINIC_OR_DEPARTMENT_OTHER): Payer: Self-pay | Admitting: *Deleted

## 2016-04-10 DIAGNOSIS — R1031 Right lower quadrant pain: Secondary | ICD-10-CM | POA: Diagnosis present

## 2016-04-10 DIAGNOSIS — Z7984 Long term (current) use of oral hypoglycemic drugs: Secondary | ICD-10-CM | POA: Diagnosis not present

## 2016-04-10 DIAGNOSIS — Z87891 Personal history of nicotine dependence: Secondary | ICD-10-CM | POA: Diagnosis not present

## 2016-04-10 DIAGNOSIS — N12 Tubulo-interstitial nephritis, not specified as acute or chronic: Secondary | ICD-10-CM | POA: Diagnosis not present

## 2016-04-10 DIAGNOSIS — Z794 Long term (current) use of insulin: Secondary | ICD-10-CM | POA: Insufficient documentation

## 2016-04-10 DIAGNOSIS — E119 Type 2 diabetes mellitus without complications: Secondary | ICD-10-CM | POA: Diagnosis not present

## 2016-04-10 LAB — CBC WITH DIFFERENTIAL/PLATELET
Basophils Absolute: 0 10*3/uL (ref 0.0–0.1)
Basophils Relative: 0 %
Eosinophils Absolute: 0.1 10*3/uL (ref 0.0–0.7)
Eosinophils Relative: 2 %
HCT: 37.7 % (ref 36.0–46.0)
Hemoglobin: 13 g/dL (ref 12.0–15.0)
Lymphocytes Relative: 35 %
Lymphs Abs: 2.6 10*3/uL (ref 0.7–4.0)
MCH: 29.9 pg (ref 26.0–34.0)
MCHC: 34.5 g/dL (ref 30.0–36.0)
MCV: 86.7 fL (ref 78.0–100.0)
Monocytes Absolute: 0.4 10*3/uL (ref 0.1–1.0)
Monocytes Relative: 6 %
Neutro Abs: 4.3 10*3/uL (ref 1.7–7.7)
Neutrophils Relative %: 57 %
Platelets: 241 10*3/uL (ref 150–400)
RBC: 4.35 MIL/uL (ref 3.87–5.11)
RDW: 12.2 % (ref 11.5–15.5)
WBC: 7.4 10*3/uL (ref 4.0–10.5)

## 2016-04-10 LAB — URINALYSIS, ROUTINE W REFLEX MICROSCOPIC
Bilirubin Urine: NEGATIVE
GLUCOSE, UA: NEGATIVE mg/dL
KETONES UR: NEGATIVE mg/dL
NITRITE: POSITIVE — AB
PH: 6.5 (ref 5.0–8.0)
Protein, ur: 30 mg/dL — AB
SPECIFIC GRAVITY, URINE: 1.02 (ref 1.005–1.030)

## 2016-04-10 LAB — PREGNANCY, URINE: Preg Test, Ur: NEGATIVE

## 2016-04-10 LAB — COMPREHENSIVE METABOLIC PANEL
ALT: 30 U/L (ref 14–54)
AST: 18 U/L (ref 15–41)
Albumin: 4.1 g/dL (ref 3.5–5.0)
Alkaline Phosphatase: 66 U/L (ref 38–126)
Anion gap: 7 (ref 5–15)
BUN: 11 mg/dL (ref 6–20)
CO2: 25 mmol/L (ref 22–32)
Calcium: 9.3 mg/dL (ref 8.9–10.3)
Chloride: 107 mmol/L (ref 101–111)
Creatinine, Ser: 0.63 mg/dL (ref 0.44–1.00)
GFR calc Af Amer: >60 mL/min (ref >60)
GFR calc non Af Amer: >60 mL/min (ref >60)
Glucose, Bld: 132 mg/dL — ABNORMAL HIGH (ref 65–99)
Potassium: 3.5 mmol/L (ref 3.5–5.1)
Sodium: 139 mmol/L (ref 135–145)
Total Bilirubin: 0.3 mg/dL (ref 0.3–1.2)
Total Protein: 7.4 g/dL (ref 6.5–8.1)

## 2016-04-10 LAB — URINE MICROSCOPIC-ADD ON

## 2016-04-10 LAB — LIPASE, BLOOD: Lipase: 23 U/L (ref 11–51)

## 2016-04-10 MED ORDER — SODIUM CHLORIDE 0.9 % IV BOLUS (SEPSIS)
1000.0000 mL | Freq: Once | INTRAVENOUS | Status: AC
  Administered 2016-04-10: 1000 mL via INTRAVENOUS

## 2016-04-10 MED ORDER — CIPROFLOXACIN HCL 500 MG PO TABS: 500.0000 mg | ORAL_TABLET | Freq: Two times a day (BID) | ORAL | 0 refills | Status: DC

## 2016-04-10 MED ORDER — CIPROFLOXACIN HCL 500 MG PO TABS
500.0000 mg | ORAL_TABLET | Freq: Once | ORAL | Status: AC
Start: 2016-04-10 — End: 2016-04-10
  Administered 2016-04-10: 500 mg via ORAL
  Filled 2016-04-10: qty 1

## 2016-04-10 MED ORDER — MORPHINE SULFATE (PF) 4 MG/ML IV SOLN
4.0000 mg | Freq: Once | INTRAVENOUS | Status: AC
  Administered 2016-04-10: 4 mg via INTRAVENOUS
  Filled 2016-04-10: qty 1

## 2016-04-10 MED ORDER — HYDROCODONE-ACETAMINOPHEN 5-325 MG PO TABS: 1.0000 | ORAL_TABLET | ORAL | 0 refills | Status: DC | PRN

## 2016-04-10 MED FILL — HYDROCODON-APAP 5-325: 5-325 | 2 days supply | Qty: 8 | Fill #0

## 2016-04-10 MED FILL — CIPROFLOXACIN HCL 500 MG TA: 500 | 7 days supply | Qty: 14 | Fill #0

## 2016-04-10 NOTE — ED Triage Notes (Signed)
Pt reports frequency, urgency, and flank pain x Wednesday, [facility], dx with "kidney infection" and given iv cipro with no rx.

## 2016-04-10 NOTE — ED Provider Notes (Signed)
[facility] Provider Note   CSN: [id number] Arrival date & time: 04/10/16  1041     History   Chief Complaint Chief Complaint  Patient presents with  . Flank Pain    HPI [name] is a 35 y.o. female with history of kidney stones, pancreatitis who presents with a six-day history of urinary frequency, urgency, right lower quadrant pain, and flank pain. Patient states she started with right lower quadrant pain and a foul odor to her urine 6 days ago. Continues to have frequency and urgency and pain radiating up her back to her shoulder blades. Patient was seen at [facility] 2 days ago and was given an IV dose of Cipro and sent home. Patient did have a CT stone study which showed bilateral kidney stones, one as large as 10mm in the renal pelvis, however nonobstructing in the ureter. Patient was feeling better that evening, however she has continued to feel worse over the past day. Patient denies any fevers. She has had some hematuria and associated nausea. Patient denies any chest pain, shortness of breath, vomiting.  HPI  Past Medical History:  Diagnosis Date  . Bipolar 1 disorder (HCC)   . Diabetes mellitus    gestational diabeties  . Pancreatitis     Patient Active Problem List   Diagnosis Date Noted  . Pancreatitis 05/20/2013  . Duodenitis 05/20/2013  . 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 CHOLECYSTECTOMY    . EUS  08/02/2011   Procedure: UPPER ENDOSCOPIC ULTRASOUND (EUS) RADIAL;  Surgeon: Freddy Jaksch, MD;  Location: [facility];  Service: Endoscopy;  Laterality: N/A;  . FINE NEEDLE ASPIRATION  08/02/2011   Procedure: FINE NEEDLE ASPIRATION (FNA) RADIAL;  Surgeon: Freddy Jaksch, MD;  Location: [facility] ENDOSCOPY;  Service: Endoscopy;  Laterality: N/A;    OB History    No data available       Home Medications    Prior to Admission medications   Medication Sig Start Date End  Date Taking? Authorizing Provider  albuterol (PROVENTIL HFA;VENTOLIN HFA) 108 (90 Base) MCG/ACT inhaler Inhale 1-2 puffs into the lungs every 4 (four) hours as needed for wheezing or shortness of breath. 09/11/15   Arthor Captain, PA-C  ALPRAZolam [name]) 0.5 MG tablet Take 0.5-1 mg by mouth 3 (three) times daily as needed for anxiety.    Historical Provider, MD  azithromycin (ZITHROMAX) 500 MG tablet Take 1 tablet (500 mg total) by mouth daily. Patient not taking: Reported on 08/21/2014 05/23/13   Leatha Gilding, MD  cephALEXin (KEFLEX) 500 MG capsule Take 1 capsule (500 mg total) by mouth 3 (three) times daily. Patient not taking: Reported on 08/21/2014 05/06/14   [name], MD  ciprofloxacin (CIPRO) 500 MG tablet Take 1 tablet (500 mg total) by mouth 2 (two) times daily. 04/10/16   Emi Holes, PA-C  gabapentin (NEURONTIN) 100 MG capsule Take 1 capsule (100 mg total) by mouth 3 (three) times daily. 02/04/15   Ambrose Finland Little, MD  glimepiride (AMARYL) 2 MG tablet Take 2 mg by mouth 3 (three) times daily.    Historical Provider, MD  guaiFENesin-codeine 100-10 MG/5ML syrup Take 5-10 mLs by mouth every 6 (six) hours as needed for cough. 09/11/15   Arthor Captain, PA-C  HYDROcodone-acetaminophen (NORCO/VICODIN) 5-325 MG tablet Take 1-2 tablets by mouth every 4 (four) hours as needed. 04/10/16   Emi Holes, PA-C  ibuprofen (ADVIL,MOTRIN) 200 MG tablet Take  800 mg by mouth every 6 (six) hours as needed for moderate pain.     Historical Provider, MD  insulin aspart (NOVOLOG) 100 UNIT/ML injection Inject 0-9 Units into the skin 3 (three) times daily with meals. CBG 70 - 120: 0 units      CBG 121 - 150: 1 unit      CBG 151 - 200: 2 units      CBG 201 - 250: 3 units      CBG 251 - 300: 4 units   CBG > 300 call MD 05/23/13   Leatha Gilding, MD  insulin detemir (LEVEMIR) 100 UNIT/ML injection Inject 50 Units into the skin at bedtime.     Historical Provider, MD  insulin lispro (HUMALOG) 100  UNIT/ML injection Inject into the skin 3 (three) times daily before meals. Sliding scale    Historical Provider, MD  metFORMIN (GLUCOPHAGE) 1000 MG tablet Take 1 tablet (1,000 mg total) by mouth 2 (two) times daily with a meal. Half a tablet twice daily for 5 days then 1 tablet in am and half in pm for 5 days then 1 tablet twice daily. Patient not taking: Reported on 02/01/2015 05/23/13   Leatha Gilding, MD  methocarbamol (ROBAXIN) 500 MG tablet Take 1 tablet (500 mg total) by mouth 2 (two) times daily. Patient taking differently: Take 500 mg by mouth 2 (two) times daily. As needed 02/01/15   [name], PA-C  ondansetron (ZOFRAN ODT) 4 MG disintegrating tablet 4mg  ODT q4 hours prn nausea/vomit Patient not taking: Reported on 02/01/2015 08/21/14   [name], MD  ondansetron (ZOFRAN ODT) 8 MG disintegrating tablet Take 1 tablet (8 mg total) by mouth every 8 (eight) hours as needed for nausea or vomiting. Patient not taking: Reported on 02/01/2015 05/06/14   [name], MD  QUEtiapine (SEROQUEL) 400 MG tablet Take 400 mg by mouth at bedtime.    Historical Provider, MD  Spacer/Aero-Holding Chambers (AEROCHAMBER PLUS WITH MASK) inhaler Use as instructed 09/11/15   Arthor Captain, PA-C    Family History History reviewed. No pertinent family history.  Social History Social History  Substance Use Topics  . Smoking status: Former Games developer  . Smokeless tobacco: Never Used  . Alcohol use No     Allergies   Penicillins; Depakote er [divalproex sodium er]; and Lactose intolerance (gi)   Review of Systems Review of Systems  Constitutional: Negative for chills and fever.  HENT: Negative for facial swelling and sore throat.   Respiratory: Negative for shortness of breath.   Cardiovascular: Negative for chest pain.  Gastrointestinal: Positive for abdominal pain. Negative for nausea and vomiting.  Genitourinary: Positive for dysuria, flank pain, frequency and urgency.  Musculoskeletal: Positive  for back pain.  Skin: Negative for rash and wound.  Neurological: Negative for headaches.  Psychiatric/Behavioral: The patient is not nervous/anxious.      Physical Exam Updated Vital Signs BP 142/92 (BP Location: Right Arm)   Pulse 81   Temp 98.5 F (36.9 C) (Oral)   Resp 16   Ht 5' (1.524 m)   Wt 65.8 kg   SpO2 100%   BMI 28.32 kg/m   Physical Exam  Constitutional: She appears well-developed and well-nourished. No distress.  HENT:  Head: Normocephalic and atraumatic.  Mouth/Throat: Oropharynx is clear and moist. No oropharyngeal exudate.  Eyes: Conjunctivae are normal. Pupils are equal, round, and reactive to light. Right eye exhibits no discharge. Left eye exhibits no discharge. No scleral icterus.  Neck: Normal range  of motion. Neck supple. No thyromegaly present.  Cardiovascular: Normal rate, regular rhythm, normal heart sounds and intact distal pulses.  Exam reveals no gallop and no friction rub.   No murmur heard. Pulmonary/Chest: Effort normal and breath sounds normal. No stridor. No respiratory distress. She has no wheezes. She has no rales.  Abdominal: Soft. Bowel sounds are normal. She exhibits no distension. There is tenderness in the right lower quadrant, epigastric area and suprapubic area. There is no rebound, no guarding and no CVA tenderness.    Musculoskeletal: She exhibits no edema.  Lymphadenopathy:    She has no cervical adenopathy.  Neurological: She is alert. Coordination normal.  Skin: Skin is warm and dry. No rash noted. She is not diaphoretic. No pallor.  Psychiatric: She has a normal mood and affect.  Nursing note and vitals reviewed.    ED Treatments / Results  Labs (all labs ordered are listed, but only abnormal results are displayed) Labs Reviewed  URINALYSIS, ROUTINE W REFLEX MICROSCOPIC ([facility]) - Abnormal; Notable for the following:       Result Value   APPearance CLOUDY (*)    Hgb urine dipstick LARGE (*)    Protein, ur 30 (*)     Nitrite POSITIVE (*)    Leukocytes, UA LARGE (*)    All other components within normal limits  COMPREHENSIVE METABOLIC PANEL - Abnormal; Notable for the following:    Glucose, Bld 132 (*)    All other components within normal limits  URINE MICROSCOPIC-ADD ON - Abnormal; Notable for the following:    Squamous Epithelial / LPF 0-5 (*)    Bacteria, UA MANY (*)    All other components within normal limits  PREGNANCY, URINE  CBC WITH DIFFERENTIAL/PLATELET  LIPASE, BLOOD    EKG  EKG Interpretation None       Radiology No results found.  Procedures Procedures (including critical care time)  Medications Ordered in ED Medications  sodium chloride 0.9 % bolus 1,000 mL (0 mLs Intravenous Stopped 04/10/16 1331)  morphine 4 MG/ML injection 4 mg (4 mg Intravenous Given 04/10/16 1240)  ciprofloxacin (CIPRO) tablet 500 mg (500 mg Oral Given 04/10/16 1325)     Initial Impression / Assessment and Plan / ED Course  I have reviewed the triage vital signs and the nursing notes.  Pertinent labs & imaging results that were available during my care of the patient were reviewed by me and considered in my medical decision making (see chart for details).  Clinical Course    CBC, CMP unremarkable. UA shows large hematuria, 30 protein, positive nitrates, large leukocytes, too numerous to count WBCs and RBCs, many bacteria. Urine pregnancy negative. I will treat for pyelonephritis, no imaging indicated at this time. Discharge home with Cipro and Norco. Strict return precautions discussed. Follow-up to PCP and urology. Patient understands and agrees with plan. Patient vitals stable throughout ED course and discharged in satisfactory condition. Discussed patient case with Dr. Jacqulyn BathLong who guided the patient's management and agrees with plan.  Final Clinical Impressions(s) / ED Diagnoses   Final diagnoses:  Pyelonephritis    New Prescriptions Discharge Medication List as of 04/10/2016  1:25 PM      START taking these medications   Details  HYDROcodone-acetaminophen (NORCO/VICODIN) 5-325 MG tablet Take 1-2 tablets by mouth every 4 (four) hours as needed., Starting Mon 04/10/2016, Print         Emi Holeslexandra M Patty Leitzke, PA-C 04/10/16 1734    Maia PlanJoshua G Long, MD 04/10/16 1925

## 2016-04-10 NOTE — Discharge Instructions (Signed)
Medications: Cipro, Norco  Treatment: Take Cipro twice daily as prescribed for 1 week. Take 1-2 Norco every 4-6 hours as needed for severe pain. Do not drive or operate machinery when taking this medication and only take as prescribed. You can take Advil or Aleve for more mild to moderate pain.   Follow-up: Please follow-up with your primary care provider as needed if symptoms are not improving. Please follow-up with your urologist for further evaluation and treatment of your kidney stones and current infection please return to the emergency department if he or developing any new or worsening symptoms including fever, intractable vomiting, lightheadedness, or any other new or concerning symptoms.

## 2016-04-13 DIAGNOSIS — R202 Paresthesia of skin: Secondary | ICD-10-CM | POA: Insufficient documentation

## 2016-04-13 DIAGNOSIS — R200 Anesthesia of skin: Secondary | ICD-10-CM | POA: Insufficient documentation

## 2016-04-13 DIAGNOSIS — M25521 Pain in right elbow: Secondary | ICD-10-CM | POA: Insufficient documentation

## 2016-04-13 DIAGNOSIS — G479 Sleep disorder, unspecified: Secondary | ICD-10-CM | POA: Insufficient documentation

## 2016-05-04 DIAGNOSIS — R519 Headache, unspecified: Secondary | ICD-10-CM | POA: Insufficient documentation

## 2016-05-04 DIAGNOSIS — G8929 Other chronic pain: Secondary | ICD-10-CM | POA: Insufficient documentation

## 2016-05-12 ENCOUNTER — Other Ambulatory Visit: Payer: Self-pay | Admitting: Urology

## 2016-05-22 ENCOUNTER — Encounter (HOSPITAL_BASED_OUTPATIENT_CLINIC_OR_DEPARTMENT_OTHER): Payer: Self-pay | Admitting: *Deleted

## 2016-05-22 NOTE — Progress Notes (Addendum)
NPO AFTER MN.  ARRIVE AT 1000.  NEEDS ISTAT 8 AND EKG.  WILL TAKE ZANTAC AND ZOLOFT AM DOS W/ SIPS OF WATER. PT STATES EVEN IF SHE DOES EAT HER SUGAR CAN BE HIGH WITHOUT INSULIN , IN 300'S , WITH INSULIN , 200'S.  PT STATED WILL CHECK SUGAR AND DO INSULIN AM DOS IF NEEDED KNOWING SHE WILL NOT HAVE EATEN.

## 2016-05-31 ENCOUNTER — Ambulatory Visit (HOSPITAL_BASED_OUTPATIENT_CLINIC_OR_DEPARTMENT_OTHER)
Admission: RE | Admit: 2016-05-31 | Discharge: 2016-05-31 | Disposition: A | Discharge status: Home / Self Care | Payer: Medicaid Other | Source: Ambulatory Visit | Attending: Urology | Admitting: Urology

## 2016-05-31 ENCOUNTER — Ambulatory Visit (HOSPITAL_COMMUNITY): Payer: Medicaid Other

## 2016-05-31 ENCOUNTER — Ambulatory Visit (HOSPITAL_BASED_OUTPATIENT_CLINIC_OR_DEPARTMENT_OTHER): Payer: Medicaid Other | Admitting: Anesthesiology

## 2016-05-31 ENCOUNTER — Encounter (HOSPITAL_BASED_OUTPATIENT_CLINIC_OR_DEPARTMENT_OTHER): Payer: Self-pay | Admitting: *Deleted

## 2016-05-31 ENCOUNTER — Encounter (HOSPITAL_BASED_OUTPATIENT_CLINIC_OR_DEPARTMENT_OTHER)
Admission: RE | Disposition: A | Discharge status: Home / Self Care | Payer: Self-pay | Source: Ambulatory Visit | Attending: Urology

## 2016-05-31 ENCOUNTER — Other Ambulatory Visit: Payer: Self-pay

## 2016-05-31 DIAGNOSIS — Z79899 Other long term (current) drug therapy: Secondary | ICD-10-CM | POA: Diagnosis not present

## 2016-05-31 DIAGNOSIS — N200 Calculus of kidney: Secondary | ICD-10-CM | POA: Diagnosis not present

## 2016-05-31 DIAGNOSIS — Z87891 Personal history of nicotine dependence: Secondary | ICD-10-CM | POA: Diagnosis not present

## 2016-05-31 DIAGNOSIS — E1142 Type 2 diabetes mellitus with diabetic polyneuropathy: Secondary | ICD-10-CM | POA: Insufficient documentation

## 2016-05-31 DIAGNOSIS — I252 Old myocardial infarction: Secondary | ICD-10-CM | POA: Insufficient documentation

## 2016-05-31 DIAGNOSIS — J45990 Exercise induced bronchospasm: Secondary | ICD-10-CM | POA: Insufficient documentation

## 2016-05-31 DIAGNOSIS — Z794 Long term (current) use of insulin: Secondary | ICD-10-CM | POA: Insufficient documentation

## 2016-05-31 DIAGNOSIS — K219 Gastro-esophageal reflux disease without esophagitis: Secondary | ICD-10-CM | POA: Diagnosis not present

## 2016-05-31 DIAGNOSIS — F319 Bipolar disorder, unspecified: Secondary | ICD-10-CM | POA: Insufficient documentation

## 2016-05-31 DIAGNOSIS — Z87442 Personal history of urinary calculi: Secondary | ICD-10-CM | POA: Insufficient documentation

## 2016-05-31 DIAGNOSIS — Z86718 Personal history of other venous thrombosis and embolism: Secondary | ICD-10-CM | POA: Diagnosis not present

## 2016-05-31 DIAGNOSIS — N201 Calculus of ureter: Secondary | ICD-10-CM

## 2016-05-31 DIAGNOSIS — R81 Glycosuria: Secondary | ICD-10-CM | POA: Insufficient documentation

## 2016-05-31 HISTORY — DX: Exercise induced bronchospasm: J45.990

## 2016-05-31 HISTORY — DX: Claustrophobia: F40.240

## 2016-05-31 HISTORY — DX: Personal history of urinary calculi: Z87.442

## 2016-05-31 HISTORY — DX: Calculus of kidney: N20.0

## 2016-05-31 HISTORY — DX: Unspecified abdominal pain: R10.9

## 2016-05-31 HISTORY — DX: Personal history of urinary (tract) infections: Z87.440

## 2016-05-31 HISTORY — DX: Other complications of anesthesia, initial encounter: T88.59XA

## 2016-05-31 HISTORY — PX: CYSTOSCOPY WITH RETROGRADE PYELOGRAM, URETEROSCOPY AND STENT PLACEMENT: SHX5789

## 2016-05-31 HISTORY — DX: Urgency of urination: R39.15

## 2016-05-31 HISTORY — DX: Long term (current) use of insulin: Z79.4

## 2016-05-31 HISTORY — DX: Gastro-esophageal reflux disease without esophagitis: K21.9

## 2016-05-31 HISTORY — DX: Type 2 diabetes mellitus without complications: E11.9

## 2016-05-31 HISTORY — DX: Adverse effect of unspecified anesthetic, initial encounter: T41.45XA

## 2016-05-31 HISTORY — DX: Personal history of other diseases of the digestive system: Z87.19

## 2016-05-31 HISTORY — DX: Frequency of micturition: R35.0

## 2016-05-31 LAB — POCT I-STAT, CHEM 8
BUN: 10 mg/dL (ref 6–20)
CALCIUM ION: 1.25 mmol/L (ref 1.15–1.40)
CREATININE: 0.8 mg/dL (ref 0.44–1.00)
Chloride: 104 mmol/L (ref 101–111)
GLUCOSE: 184 mg/dL — AB (ref 65–99)
HEMATOCRIT: 42 % (ref 36.0–46.0)
HEMOGLOBIN: 14.3 g/dL (ref 12.0–15.0)
Potassium: 4.1 mmol/L (ref 3.5–5.1)
Sodium: 138 mmol/L (ref 135–145)
TCO2: 23 mmol/L (ref 0–100)

## 2016-05-31 LAB — GLUCOSE, CAPILLARY: Glucose-Capillary: 149 mg/dL — ABNORMAL HIGH (ref 65–99)

## 2016-05-31 SURGERY — CYSTOURETEROSCOPY, WITH RETROGRADE PYELOGRAM AND STENT INSERTION
Anesthesia: General | Site: Ureter | Laterality: Bilateral

## 2016-05-31 MED ORDER — SODIUM CHLORIDE 0.9 % IR SOLN
Status: DC | PRN
  Administered 2016-05-31: 4000 mL

## 2016-05-31 MED ORDER — MIDAZOLAM HCL 5 MG/5ML IJ SOLN
INTRAMUSCULAR | Status: DC | PRN
  Administered 2016-05-31: 2 mg via INTRAVENOUS

## 2016-05-31 MED ORDER — PROPOFOL 10 MG/ML IV BOLUS
INTRAVENOUS | Status: AC
  Filled 2016-05-31: qty 20

## 2016-05-31 MED ORDER — LIDOCAINE HCL (CARDIAC) 20 MG/ML IV SOLN: INTRAVENOUS | Status: DC | PRN

## 2016-05-31 MED ORDER — FENTANYL CITRATE (PF) 100 MCG/2ML IJ SOLN
INTRAMUSCULAR | Status: DC | PRN
  Administered 2016-05-31: 50 ug via INTRAVENOUS

## 2016-05-31 MED ORDER — ONDANSETRON HCL 4 MG/2ML IJ SOLN
INTRAMUSCULAR | Status: AC
  Filled 2016-05-31: qty 2

## 2016-05-31 MED ORDER — OXYCODONE-ACETAMINOPHEN 5-325 MG PO TABS: 1.0000 | ORAL_TABLET | Freq: Four times a day (QID) | ORAL | 0 refills | Status: DC | PRN

## 2016-05-31 MED ORDER — LIDOCAINE 2% (20 MG/ML) 5 ML SYRINGE
INTRAMUSCULAR | Status: AC
  Filled 2016-05-31: qty 10

## 2016-05-31 MED ORDER — PROMETHAZINE HCL 25 MG/ML IJ SOLN
6.2500 mg | INTRAMUSCULAR | Status: DC | PRN
  Filled 2016-05-31: qty 1

## 2016-05-31 MED ORDER — SENNOSIDES-DOCUSATE SODIUM 8.6-50 MG PO TABS: 1.0000 | ORAL_TABLET | Freq: Two times a day (BID) | ORAL | 0 refills | Status: DC

## 2016-05-31 MED ORDER — OXYCODONE HCL 5 MG PO TABS
ORAL_TABLET | ORAL | Status: AC
  Filled 2016-05-31: qty 1

## 2016-05-31 MED ORDER — DEXAMETHASONE SODIUM PHOSPHATE 10 MG/ML IJ SOLN
INTRAMUSCULAR | Status: AC
  Filled 2016-05-31: qty 1

## 2016-05-31 MED ORDER — IOHEXOL 300 MG/ML  SOLN
INTRAMUSCULAR | Status: DC | PRN
  Administered 2016-05-31: 10 mL

## 2016-05-31 MED ORDER — OXYCODONE HCL 5 MG PO TABS
5.0000 mg | ORAL_TABLET | Freq: Once | ORAL | Status: AC
  Administered 2016-05-31: 5 mg via ORAL
  Filled 2016-05-31: qty 1

## 2016-05-31 MED ORDER — LACTATED RINGERS IV SOLN
INTRAVENOUS | Status: DC
  Administered 2016-05-31 (×2): via INTRAVENOUS
  Filled 2016-05-31: qty 1000

## 2016-05-31 MED ORDER — GENTAMICIN SULFATE 40 MG/ML IJ SOLN
5.0000 mg/kg | INTRAVENOUS | Status: AC
  Administered 2016-05-31: 280 mg via INTRAVENOUS
  Filled 2016-05-31: qty 7

## 2016-05-31 MED ORDER — MEPERIDINE HCL 25 MG/ML IJ SOLN
6.2500 mg | INTRAMUSCULAR | Status: DC | PRN
  Filled 2016-05-31: qty 1

## 2016-05-31 MED ORDER — LACTATED RINGERS IV SOLN
INTRAVENOUS | Status: DC
  Filled 2016-05-31: qty 1000

## 2016-05-31 MED ORDER — PROPOFOL 10 MG/ML IV BOLUS
INTRAVENOUS | Status: DC | PRN
  Administered 2016-05-31: 200 mg via INTRAVENOUS

## 2016-05-31 MED ORDER — SULFAMETHOXAZOLE-TRIMETHOPRIM 400-80 MG PO TABS: 1.0000 | ORAL_TABLET | Freq: Two times a day (BID) | ORAL | 0 refills | Status: DC

## 2016-05-31 MED ORDER — LIDOCAINE 2% (20 MG/ML) 5 ML SYRINGE
INTRAMUSCULAR | Status: DC | PRN
  Administered 2016-05-31: 60 mg via INTRAVENOUS

## 2016-05-31 MED ORDER — MIDAZOLAM HCL 2 MG/2ML IJ SOLN
INTRAMUSCULAR | Status: AC
  Filled 2016-05-31: qty 2

## 2016-05-31 MED ORDER — HYDROMORPHONE HCL 1 MG/ML IJ SOLN
0.2500 mg | INTRAMUSCULAR | Status: DC | PRN
  Filled 2016-05-31: qty 0.5

## 2016-05-31 MED ORDER — ONDANSETRON HCL 4 MG/2ML IJ SOLN
INTRAMUSCULAR | Status: DC | PRN
  Administered 2016-05-31: 4 mg via INTRAVENOUS

## 2016-05-31 MED ORDER — FENTANYL CITRATE (PF) 100 MCG/2ML IJ SOLN
INTRAMUSCULAR | Status: AC
  Filled 2016-05-31: qty 2

## 2016-05-31 MED ORDER — GENTAMICIN IN SALINE 1.6-0.9 MG/ML-% IV SOLN
80.0000 mg | INTRAVENOUS | Status: DC
  Filled 2016-05-31: qty 50

## 2016-05-31 SURGICAL SUPPLY — 32 items
BAG DRAIN URO-CYSTO SKYTR STRL (DRAIN) ×3 IMPLANT
BASKET DAKOTA 1.9FR 11X120 (BASKET) IMPLANT
BASKET LASER NITINOL 1.9FR (BASKET) ×3 IMPLANT
BASKET ZERO TIP NITINOL 2.4FR (BASKET) IMPLANT
CATH INTERMIT  6FR 70CM (CATHETERS) ×3 IMPLANT
CLOTH BEACON ORANGE TIMEOUT ST (SAFETY) ×3 IMPLANT
FIBER LASER FLEXIVA 365 (UROLOGICAL SUPPLIES) IMPLANT
FIBER LASER TRAC TIP (UROLOGICAL SUPPLIES) ×3 IMPLANT
GLOVE BIO SURGEON STRL SZ7.5 (GLOVE) ×3 IMPLANT
GLOVE ECLIPSE 7.0 STRL STRAW (GLOVE) ×3 IMPLANT
GLOVE INDICATOR 7.0 STRL GRN (GLOVE) ×3 IMPLANT
GLOVE INDICATOR 7.5 STRL GRN (GLOVE) ×6 IMPLANT
GLOVE SURG SS PI 7.5 STRL IVOR (GLOVE) ×3 IMPLANT
GOWN STRL REUS W/ TWL LRG LVL3 (GOWN DISPOSABLE) ×2 IMPLANT
GOWN STRL REUS W/ TWL XL LVL3 (GOWN DISPOSABLE) ×1 IMPLANT
GOWN STRL REUS W/TWL LRG LVL3 (GOWN DISPOSABLE) ×4
GOWN STRL REUS W/TWL XL LVL3 (GOWN DISPOSABLE) ×8 IMPLANT
GUIDEWIRE ANG ZIPWIRE 038X150 (WIRE) ×3 IMPLANT
GUIDEWIRE STR DUAL SENSOR (WIRE) ×3 IMPLANT
IV NS 1000ML (IV SOLUTION) ×2
IV NS 1000ML BAXH (IV SOLUTION) ×1 IMPLANT
IV NS IRRIG 3000ML ARTHROMATIC (IV SOLUTION) ×3 IMPLANT
KIT ROOM TURNOVER WOR (KITS) ×3 IMPLANT
MANIFOLD NEPTUNE II (INSTRUMENTS) ×3 IMPLANT
NS IRRIG 500ML POUR BTL (IV SOLUTION) ×3 IMPLANT
PACK CYSTO (CUSTOM PROCEDURE TRAY) ×3 IMPLANT
SHEATH ACCESS URETERAL 24CM (SHEATH) ×3 IMPLANT
STENT POLARIS 5FRX22 (STENTS) ×3 IMPLANT
SYRINGE 10CC LL (SYRINGE) ×3 IMPLANT
TUBE CONNECTING 12'X1/4 (SUCTIONS)
TUBE CONNECTING 12X1/4 (SUCTIONS) IMPLANT
TUBE FEEDING 8FR 16IN STR KANG (MISCELLANEOUS) ×3 IMPLANT

## 2016-05-31 NOTE — Transfer of Care (Signed)
Immediate Anesthesia Transfer of Care Note  Patient: [name]  Procedure(s) Performed: Procedure(s): CYSTOSCOPY WITH BILATERAL RETROGRADE PYELOGRAM, RIGHT URETEROSCOPY, RIGHT STENT PLACEMENT, RIGHT LASER LITHOTRIPSY (Bilateral)  Patient Location: PACU  Anesthesia Type:General  Level of Consciousness: awake, alert , oriented and patient cooperative  Airway & Oxygen Therapy: Patient Spontanous Breathing and Patient connected to nasal cannula oxygen  Post-op Assessment: Report given to RN and Post -op Vital signs reviewed and stable  Post vital signs: Reviewed and stable  Last Vitals:  Vitals:   05/31/16 1009  BP: 125/83  Pulse: 86  Resp: 16  Temp: 36.8 C    Last Pain:  Vitals:   05/31/16 1102  TempSrc:   PainSc: 8       Patients Stated Pain Goal: 9 (05/31/16 1102)  Complications: No apparent anesthesia complications

## 2016-05-31 NOTE — Brief Op Note (Signed)
05/31/2016  1:12 PM  PATIENT:  [name]  35 y.o. female  PRE-OPERATIVE DIAGNOSIS:  RIGHT RENAL STONE, RECURRENT URINARY TRACT INFECTION, BILATERAL FLANK PAIN  POST-OPERATIVE DIAGNOSIS:  RIGHT RENAL STONE, RECURRENT URINARY TRACT INFECTION, BILATERAL FLANK PAIN  PROCEDURE:  Procedure(s): CYSTOSCOPY WITH BILATERAL RETROGRADE PYELOGRAM, RIGHT URETEROSCOPY, RIGHT STENT PLACEMENT, RIGHT LASER LITHOTRIPSY (Bilateral)  SURGEON:  Surgeon(s) and Role:    * Sebastian Acheheodore Jarreau Callanan, MD - Primary  PHYSICIAN ASSISTANT:   ASSISTANTS: none   ANESTHESIA:   general  EBL:  Total I/O In: 1000 [I.V.:1000] Out: -   BLOOD ADMINISTERED:none  DRAINS: none   LOCAL MEDICATIONS USED:  NONE  SPECIMEN:  Source of Specimen:  Rt ureteral stone fragemnts  DISPOSITION OF SPECIMEN:  [facility] for compositional analysis  COUNTS:  YES  TOURNIQUET:  * No tourniquets in log *  DICTATION: .Other Dictation: Dictation Number [id number]  PLAN OF CARE: Discharge to home after PACU  PATIENT DISPOSITION:  PACU - hemodynamically stable.   Delay start of Pharmacological VTE agent (>24hrs) due to surgical blood loss or risk of bleeding: yes

## 2016-05-31 NOTE — Discharge Instructions (Signed)
1 - You may have urinary urgency (bladder spasms) and bloody urine on / off with stent in place. This is normal.  2 - Remove tethered stent on Friday morning at home by pulling on string, then blue-white plastic tubing, and discarding. Office is open Friday if any acute issues arise.   3  - Call MD or go to ER for fever >102, severe pain / nausea / vomiting not relieved by medications, or acute change in medical status  Post Anesthesia Home Care Instructions  Activity: Get plenty of rest for the remainder of the day. A responsible adult should stay with you for 24 hours following the procedure.  For the next 24 hours, DO NOT: -Drive a car -Advertising copywriterperate machinery -Drink alcoholic beverages -Take any medication unless instructed by your physician -Make any legal decisions or sign important papers.  Meals: Start with liquid foods such as gelatin or soup. Progress to regular foods as tolerated. Avoid greasy, spicy, heavy foods. If nausea and/or vomiting occur, drink only clear liquids until the nausea and/or vomiting subsides. Call your physician if vomiting continues.  Special Instructions/Symptoms: Your throat may feel dry or sore from the anesthesia or the breathing tube placed in your throat during surgery. If this causes discomfort, gargle with warm salt water. The discomfort should disappear within 24 hours.  If you had a scopolamine patch placed behind your ear for the management of post- operative nausea and/or vomiting:  1. The medication in the patch is effective for 72 hours, after which it should be removed.  Wrap patch in a tissue and discard in the trash. Wash hands thoroughly with soap and water. 2. You may remove the patch earlier than 72 hours if you experience unpleasant side effects which may include dry mouth, dizziness or visual disturbances. 3. Avoid touching the patch. Wash your hands with soap and water after contact with the patch.   [facility] [phone] Post Ureteroscopy With or Without Stent Instructions  Definitions:  Ureter: The duct that transports urine from the kidney to the bladder. Stent:   A plastic hollow tube that is placed into the ureter, from the kidney to the bladder to prevent the ureter from swelling shut.  GENERAL INSTRUCTIONS:  Despite the fact that no skin incisions were used, the area around the ureter and bladder is raw and irritated. The stent is a foreign body which will further irritate the bladder wall. This irritation is manifested by increased frequency of urination, both day and night, and by an increase in the urge to urinate. In some, the urge to urinate is present almost always. Sometimes the urge is strong enough that you may not be able to stop yourself from urinating. The only real cure is to remove the stent and then give time for the bladder wall to heal which can't be done until the danger of the ureter swelling shut has passed, which varies.  You may see some blood in your urine while the stent is in place and a few days afterwards. Do not be alarmed, even if the urine was clear for a while. Get off your feet and drink lots of fluids until clearing occurs. If you start to pass clots or don't improve, call us.  DIET: You may return to your normal diet immediately. Because of the raw surface of your bladder, alcohol, spicy foods, acid type foods and drinks with caffeine may cause irritation or frequency and should be used in moderation. To keep your urine  flowing freely and to avoid constipation, drink plenty of fluids during the day ( 8-10 glasses ). Tip: Avoid cranberry juice because it is very acidic.  ACTIVITY: Your physical activity doesn't need to be restricted. However, if you are very active, you may see some blood in your urine. We suggest that you reduce your activity under these circumstances until the bleeding has stopped.  BOWELS: It is important to keep your bowels  regular during the postoperative period. Straining with bowel movements can cause bleeding. A bowel movement every other day is reasonable. Use a mild laxative if needed, such as Milk of Magnesia 2-3 tablespoons, or 2 Dulcolax tablets. Call if you continue to have problems. If you have been taking narcotics for pain, before, during or after your surgery, you may be constipated. Take a laxative if necessary.   MEDICATION: You should resume your pre-surgery medications unless told not to. In addition you will often be given an antibiotic to prevent infection. These should be taken as prescribed until the bottles are finished unless you are having an unusual reaction to one of the drugs.  PROBLEMS YOU SHOULD REPORT TO US:  Fevers over 100.5 Fahrenheit.  Heavy bleeding, or clots ( See above notes about blood in urine ).  Inability to urinate.  Drug reactions ( hives, rash, nausea, vomiting, diarrhea ).  Severe burning or pain with urination that is not improving.  FOLLOW-UP: You will need a follow-up appointment to monitor your progress. Call for this appointment at the number listed above. Usually the first appointment will be about three to fourteen days after your surgery.

## 2016-05-31 NOTE — Anesthesia Preprocedure Evaluation (Addendum)
Anesthesia Evaluation  Patient identified by MRN, date of birth, ID band Patient awake    Airway Mallampati: I       Dental  (+) Teeth Intact, Dental Advisory Given   Pulmonary asthma , former smoker,    breath sounds clear to auscultation       Cardiovascular Exercise Tolerance: Good + DVT   Rhythm:Regular Rate:Normal  05-31-16 EKG Normal sinus rhythm Anterior infarct , age undetermined   Neuro/Psych Anxiety Bipolar Disorder Claustrophobia   GI/Hepatic GERD  Medicated and Controlled,  Endo/Other  diabetes, Poorly Controlled, Type 2, Oral Hypoglycemic Agents, Insulin Dependent  Renal/GU Renal disease     Musculoskeletal   Abdominal   Peds  Hematology   Anesthesia Other Findings   Reproductive/Obstetrics                        Anesthesia Physical Anesthesia Plan  ASA: II  Anesthesia Plan: General   Post-op Pain Management:    Induction: Intravenous  Airway Management Planned: LMA  Additional Equipment:   Intra-op Plan:   Post-operative Plan: Extubation in OR  Informed Consent: I have reviewed the patients History and Physical, chart, labs and discussed the procedure including the risks, benefits and alternatives for the proposed anesthesia with the patient or authorized representative who has indicated his/her understanding and acceptance.   Dental advisory given  Plan Discussed with: CRNA  Anesthesia Plan Comments: (Multiple metal piercing's that cannot be removed. Pt is aware of risk of skin damage and burning. )        Anesthesia Quick Evaluation

## 2016-05-31 NOTE — H&P (Signed)
[name] is an 35 y.o. female.    Chief Complaint: Pre-op RIGHT Ureteroscopic Stone Manipulation  HPI:   1 - Recurrent cystitis - few episodes per year simple cystits. One febrile episode 04/2016 managed as outpaient. No clonal CX's on file ever. No pyelo hospitalizations. She diabetic on insulin and oral agents and admittedly "sugars crazy sometimes", massive glycosuria off the chart on UA x many and CBGs 300+ x several. Pelvic 08/2015 normal. PVR 08/2015 "24 mL". No post-coital pattern. CT 2016 with small non-obstructing Rt renal stones.   Recent Course:  04/2016 - empiric treatment for suspect pyelo. NO CX data obtained. Afebrile, No leukocytosis, Massive glycosuria once again.   UCX e.coli sens bactrim for which she was treated.    2 - Nephrolithiasis - RLP 5mm and mid punctate non-obstructing stoens by ER imaging 08/2014. Has passed several small stones medically prior, never surgically treated. Has IUD, not seeking pregnancy.   04/2016 - 10mm Rt renal pelvis and 8644m lower pole stones with ?intermitant right obstruction / ball valving.    PMH sig for Bipolar, IDDM2 with LE neuropathy (follows Debara PickettJeff Kerr), lap chole. Her PCP is [place]ia Rankins MD.    Today " [name]" is seen to proceed with RIGHT ureteroscopic stone manipulation with goal of stone free. She has been on bactrim proph according to most recent CX's.    Past Medical History:  Diagnosis Date  . Bilateral flank pain   . Bipolar 1 disorder (HCC)   . Claustrophobia   . Complication of anesthesia    claustrophobic- jittery   . Exercise-induced asthma   . Frequency of urination   . GERD (gastroesophageal reflux disease)    watches diet  . History of acute pancreatitis    04-19-2013;  12/ 2012;  09/ 2011  . History of kidney stones    since age [age]  . History of recurrent UTIs   . Insulin dependent type 2 diabetes mellitus (HCC)   . Renal calculus, right   . Urgency of urination     Past Surgical History:   Procedure Laterality Date  . ERCP W/ SPHICTEROTOMY  01/28/2010  . EUS  08/02/2011   Procedure: UPPER ENDOSCOPIC ULTRASOUND (EUS) RADIAL;  Surgeon: [name], MD;  Location: [facility];  Service: Endoscopy;  Laterality: N/A;  . FINE NEEDLE ASPIRATION  08/02/2011   Procedure: FINE NEEDLE ASPIRATION (FNA) RADIAL;  Surgeon: [name], MD;  Location: [facility];  Service: Endoscopy;  Laterality: N/A;  . LAPAROSCOPIC CHOLECYSTECTOMY  07/25/2010    History reviewed. No pertinent family history. Social History:  reports that she quit smoking about 4 years ago. Her smoking use included Cigarettes. She quit after 15.00 years of use. She has never used smokeless tobacco. She reports that she drinks alcohol. She reports that she does not use drugs.  Allergies:  Allergies  Allergen Reactions  . Penicillins Anaphylaxis  . Depakote Er [Divalproex Sodium Er] Other (See Comments)     Hx recurrent pancreatitis  . Lactose Intolerance (Gi)     No prescriptions prior to admission.    No results found for this or any previous visit (from the past 48 hour(s)). No results found.  Review of Systems  Constitutional: Negative.   HENT: Negative.   Eyes: Negative.   Respiratory: Negative.   Cardiovascular: Negative.   Gastrointestinal: Negative.   Genitourinary: Positive for flank pain.  Skin: Negative.   Neurological: Negative.   Endo/Heme/Allergies: Negative.   Psychiatric/Behavioral: Negative.  Height 5' (1.524 m), weight 65.8 kg (145 lb). Physical Exam  Constitutional: She is oriented to person, place, and time. She appears well-developed.  HENT:  Head: Normocephalic.  Eyes: Pupils are equal, round, and reactive to light.  Neck: Normal range of motion.  Cardiovascular: Normal rate.   Respiratory: Effort normal.  GI: Soft.  Genitourinary:  Genitourinary Comments: No CVAT at present.  Neurological: She is alert and oriented to person, place, and time.  Skin: Skin is  warm.  Psychiatric: She has a normal mood and affect. Her behavior is normal.     Assessment/Plan  Risks, benefits, alternatives, expected peri-op course discussed previously and reiterated today. She has been on CX -specific antibmicrobials to decrase infectious risk though that still remains clearly above average with her diabetes.   [name], Anne Leeson, MD 05/31/2016, 6:56 AM

## 2016-05-31 NOTE — Anesthesia Procedure Notes (Signed)
Procedure Name: LMA Insertion Date/Time: 05/31/2016 12:27 PM Performed by: Tyrone NineSAUVE, Richrd Kuzniar F Pre-anesthesia Checklist: Patient identified, Emergency Drugs available, Suction available, Patient being monitored and Timeout performed Patient Re-evaluated:Patient Re-evaluated prior to inductionOxygen Delivery Method: Circle system utilized Preoxygenation: Pre-oxygenation with 100% oxygen Intubation Type: IV induction Ventilation: Mask ventilation without difficulty LMA: LMA inserted LMA Size: 4.0 Number of attempts: 1 Placement Confirmation: positive ETCO2 and breath sounds checked- equal and bilateral Tube secured with: Tape

## 2016-06-01 ENCOUNTER — Encounter (HOSPITAL_BASED_OUTPATIENT_CLINIC_OR_DEPARTMENT_OTHER): Payer: Self-pay | Admitting: Urology

## 2016-06-01 NOTE — Anesthesia Postprocedure Evaluation (Signed)
Anesthesia Post Note  Patient: [name]  Procedure(s) Performed: Procedure(s) (LRB): CYSTOSCOPY WITH BILATERAL RETROGRADE PYELOGRAM, RIGHT URETEROSCOPY, RIGHT STENT PLACEMENT, RIGHT LASER LITHOTRIPSY (Bilateral)  Patient location during evaluation: PACU Anesthesia Type: General Level of consciousness: awake and alert Pain management: pain level controlled Vital Signs Assessment: post-procedure vital signs reviewed and stable Respiratory status: spontaneous breathing, nonlabored ventilation, respiratory function stable and patient connected to nasal cannula oxygen Cardiovascular status: blood pressure returned to baseline and stable Postop Assessment: no signs of nausea or vomiting Anesthetic complications: no    Last Vitals:  Vitals:   05/31/16 1400 05/31/16 1443  BP: 124/87 134/84  Pulse: 83   Resp: 13 16  Temp:  36.8 C    Last Pain:  Vitals:   05/31/16 1443  TempSrc: Tympanic  PainSc:                  Shelton SilvasKevin D Amando Ishikawa

## 2016-06-01 NOTE — Op Note (Signed)
NAMAlphonsa Overall:  Mogan, Victoria              ACCOUNT NO.:  [id number]  MEDICAL RECORD NO.:  [id number]  LOCATION:                                 FACILITY:  PHYSICIAN:  Sebastian Acheheodore Siniya Lichty, MD     DATE OF BIRTH:  1981/04/07  DATE OF PROCEDURE: 05/31/2016                               OPERATIVE REPORT   DIAGNOSIS:  Right renal stones with suspect intermittent colic.  PROCEDURES: 1. Cystoscopy with bilateral retrograde pyelograms with     interpretation. 2. Right ureteroscopy with laser lithotripsy. 3. Insertion of right ureteral stent, 5 x 22, Polaris, no tether.  ESTIMATED BLOOD LOSS:  Nil.  COMPLICATIONS:  None.  SPECIMEN:  Right renal stone fragments for compositional analysis.  FINDINGS: 1. Unremarkable left retrograde pyelogram. 2. Unremarkable right retrograde pyelogram. 3. Large right renal pelvis stone with some erythema and edema of the     UPJ consistent with likely intermittent obstruction. 4. Smaller right midpole, 4 mm stone. 5. Complete resolution of all stone fragments larger than 1/3rd mm     following basket extraction and laser lithotripsy. 6. Successful placement of right ureteral stent, proximal end in the     renal pelvis and distal end in the urinary bladder.  INDICATION:  [name] is a pleasant 35 year old woman who on workup of colicky flank pain and recurrent cystitis, who was found to have increasing volume right renal stone.  There was some concern of possible ball-valving, intermittent obstruction on the right, she has no frank hydronephrosis.  Options were discussed for management including observation versus shockwave lithotripsy versus ureteroscopy with goal of stone free in effort to hopefully reduce nidus of potential recurrent infections as well as intermittent obstruction and she wished to proceed with the latter.  Informed consent was obtained and placed in the medical record.  PROCEDURE IN DETAIL:  The patient being [name], was  verified. Procedure being right ureteroscopic stone manipulation as well as left retrograde pyelogram was confirmed.  Procedure was carried out.  Time- out was performed.  Intravenous antibiotics were administered.  General anesthesia was introduced.  The patient was placed into a low lithotomy position and sterile field was created by prepping and draping the patient's vagina, introitus and proximal thighs using iodine x3.  Next, cystourethroscopy was performed using a 21-French rigid cystoscope with offset lens.  Inspection of the urinary bladder revealed no diverticula, calcifications, papillary lesions.  As the patient also admitted to having some intermittent left-sided pain, left retrograde pyelogram was obtained.  Left retrograde pyelogram demonstrated a single left ureter with single- system left kidney.  No filling defects or narrowing noted.  There was complete return of contrast on drainage films.  Similarly, right retrograde pyelogram was obtained.  Right retrograde pyelogram demonstrated a single right ureter with single-system right kidney.  No filling defects or narrowing were noted at this time.  A 0.038 Zip wire was advanced to the level of the upper pole, set aside as a safety wire.  An 8-French feeding tube was placed in the urinary bladder for pressure release.  Next, semi-rigid ureteroscopy was performed of the entire length of the right ureter alongside a separate Sensor working wire.  No  mucosal abnormalities were found.  The semi-rigid scope was then exchanged with 12/14, 24-cm ureteral access sheath at the level of the proximal ureter using continuous fluoroscopic guidance and flexible digital ureteroscopy was performed of the proximal right ureter and systematic inspection of the right kidney including all calices x3.  A dominant intrarenal stone was seen with many jagged edges.  There were some mild erythema and mucosal edema at the area of UPJ likely  consistent with ball-valving in this location.  There was another midpole papillary tip calcification approximately 4 mm other corresponding to known stone on imaging.  These appeared to be much too large for simple basketing.  As such, holmium laser energy was applied to the stone using settings of 0.4 joules and 40 hertz using a dusting technique for approximately half of volume in a fragmentation technique for the remaining 50% volume, degenerated several stone fragments, 1-2 mm in size, which were then sequentially grasped and brought out in their entirety with an Escape basket. Following these maneuvers, there was complete resolution of all stone fragments larger than 1/3rd mm, excellent hemostasis, no evidence of ureteral perforation.  The access sheath was removed under continuous vision.  No mucosal abnormalities were found.  Given the volume of stone, this fragmented today and the use of access sheath, it was felt that interval stenting would be warranted.  As such, a new 5 x 22 Polaris-type stent was placed over the remaining safety wire using fluoroscopic guidance, good proximal and distal deployment were noted. Tether was left in place and fashioned to the thigh and the procedure was terminated.  The patient tolerated the procedure well.  There were no immediate periprocedural complications.  The patient was taken to the postanesthesia care unit in stable condition.    ______________________________ Sebastian Acheheodore Tyden Kann, MD   ______________________________ Sebastian Acheheodore Kinsley Nicklaus, MD    TM/MEDQ  D:  05/31/2016  T:  06/01/2016  Job:  [id number]

## 2016-08-11 ENCOUNTER — Emergency Department (HOSPITAL_BASED_OUTPATIENT_CLINIC_OR_DEPARTMENT_OTHER): Payer: Medicaid Other

## 2016-08-11 ENCOUNTER — Encounter (HOSPITAL_BASED_OUTPATIENT_CLINIC_OR_DEPARTMENT_OTHER): Payer: Self-pay | Admitting: *Deleted

## 2016-08-11 ENCOUNTER — Emergency Department (HOSPITAL_BASED_OUTPATIENT_CLINIC_OR_DEPARTMENT_OTHER)
Admission: EM | Admit: 2016-08-11 | Discharge: 2016-08-11 | Disposition: A | Discharge status: Home / Self Care | Payer: Medicaid Other | Attending: Emergency Medicine | Admitting: Emergency Medicine

## 2016-08-11 DIAGNOSIS — R112 Nausea with vomiting, unspecified: Secondary | ICD-10-CM

## 2016-08-11 DIAGNOSIS — R300 Dysuria: Secondary | ICD-10-CM | POA: Diagnosis not present

## 2016-08-11 DIAGNOSIS — Z794 Long term (current) use of insulin: Secondary | ICD-10-CM | POA: Diagnosis not present

## 2016-08-11 DIAGNOSIS — Z87891 Personal history of nicotine dependence: Secondary | ICD-10-CM | POA: Diagnosis not present

## 2016-08-11 DIAGNOSIS — K5900 Constipation, unspecified: Secondary | ICD-10-CM | POA: Insufficient documentation

## 2016-08-11 DIAGNOSIS — E119 Type 2 diabetes mellitus without complications: Secondary | ICD-10-CM | POA: Diagnosis not present

## 2016-08-11 DIAGNOSIS — R1011 Right upper quadrant pain: Secondary | ICD-10-CM | POA: Diagnosis present

## 2016-08-11 DIAGNOSIS — R109 Unspecified abdominal pain: Secondary | ICD-10-CM

## 2016-08-11 DIAGNOSIS — Z79899 Other long term (current) drug therapy: Secondary | ICD-10-CM | POA: Insufficient documentation

## 2016-08-11 LAB — URINALYSIS, ROUTINE W REFLEX MICROSCOPIC
BILIRUBIN URINE: NEGATIVE
Glucose, UA: NEGATIVE mg/dL
Hgb urine dipstick: NEGATIVE
Ketones, ur: NEGATIVE mg/dL
LEUKOCYTES UA: NEGATIVE
Nitrite: NEGATIVE
PH: 6 (ref 5.0–8.0)
Protein, ur: 30 mg/dL — AB
SPECIFIC GRAVITY, URINE: 1.035 — AB (ref 1.005–1.030)

## 2016-08-11 LAB — PREGNANCY, URINE: PREG TEST UR: NEGATIVE

## 2016-08-11 LAB — COMPREHENSIVE METABOLIC PANEL
ALT: 34 U/L (ref 14–54)
AST: 25 U/L (ref 15–41)
Albumin: 4.3 g/dL (ref 3.5–5.0)
Alkaline Phosphatase: 70 U/L (ref 38–126)
Anion gap: 10 (ref 5–15)
BUN: 15 mg/dL (ref 6–20)
CHLORIDE: 103 mmol/L (ref 101–111)
CO2: 22 mmol/L (ref 22–32)
Calcium: 8.7 mg/dL — ABNORMAL LOW (ref 8.9–10.3)
Creatinine, Ser: 0.64 mg/dL (ref 0.44–1.00)
Glucose, Bld: 167 mg/dL — ABNORMAL HIGH (ref 65–99)
POTASSIUM: 3.5 mmol/L (ref 3.5–5.1)
SODIUM: 135 mmol/L (ref 135–145)
Total Bilirubin: 0.9 mg/dL (ref 0.3–1.2)
Total Protein: 8.1 g/dL (ref 6.5–8.1)

## 2016-08-11 LAB — CBC
HEMATOCRIT: 40 % (ref 36.0–46.0)
Hemoglobin: 13.7 g/dL (ref 12.0–15.0)
MCH: 29.9 pg (ref 26.0–34.0)
MCHC: 34.3 g/dL (ref 30.0–36.0)
MCV: 87.3 fL (ref 78.0–100.0)
Platelets: 200 10*3/uL (ref 150–400)
RBC: 4.58 MIL/uL (ref 3.87–5.11)
RDW: 13 % (ref 11.5–15.5)
WBC: 7.5 10*3/uL (ref 4.0–10.5)

## 2016-08-11 LAB — LIPASE, BLOOD: LIPASE: 18 U/L (ref 11–51)

## 2016-08-11 LAB — URINALYSIS, MICROSCOPIC (REFLEX)
RBC / HPF: NONE SEEN RBC/hpf (ref 0–5)
WBC, UA: NONE SEEN WBC/hpf (ref 0–5)

## 2016-08-11 MED ORDER — SODIUM CHLORIDE 0.9 % IV BOLUS (SEPSIS)
1000.0000 mL | Freq: Once | INTRAVENOUS | Status: AC
  Administered 2016-08-11: 1000 mL via INTRAVENOUS

## 2016-08-11 MED ORDER — KETOROLAC TROMETHAMINE 30 MG/ML IJ SOLN
30.0000 mg | Freq: Once | INTRAMUSCULAR | Status: AC
  Administered 2016-08-11: 30 mg via INTRAMUSCULAR
  Filled 2016-08-11: qty 1

## 2016-08-11 MED ORDER — ONDANSETRON 4 MG PO TBDP
4.0000 mg | ORAL_TABLET | Freq: Once | ORAL | Status: AC
  Administered 2016-08-11: 4 mg via ORAL

## 2016-08-11 MED ORDER — PHENAZOPYRIDINE HCL 200 MG PO TABS: 200.0000 mg | ORAL_TABLET | Freq: Three times a day (TID) | ORAL | 0 refills | Status: DC | PRN

## 2016-08-11 MED ORDER — MORPHINE SULFATE (PF) 4 MG/ML IV SOLN
4.0000 mg | Freq: Once | INTRAVENOUS | Status: DC
  Filled 2016-08-11: qty 1

## 2016-08-11 MED ORDER — IOPAMIDOL (ISOVUE-300) INJECTION 61%
100.0000 mL | Freq: Once | INTRAVENOUS | Status: AC | PRN
  Administered 2016-08-11: 100 mL via INTRAVENOUS

## 2016-08-11 MED ORDER — ONDANSETRON 4 MG PO TBDP
ORAL_TABLET | ORAL | Status: AC
  Filled 2016-08-11: qty 1

## 2016-08-11 MED ORDER — SODIUM CHLORIDE 0.9 % IV BOLUS (SEPSIS): 1000.0000 mL | Freq: Once | INTRAVENOUS | Status: DC

## 2016-08-11 NOTE — ED Notes (Signed)
ED Provider at bedside. 

## 2016-08-11 NOTE — ED Provider Notes (Signed)
[facility] Provider Note   CSN: [id number] Arrival date & time: 08/11/16  1717   By signing my name below, I, Soijett Blue, attest that this documentation has been prepared under the direction and in the presence of Wilburn Mylar, PA-C Electronically Signed: Soijett Blue, ED Scribe. 08/11/16. 7:49 PM.  History   Chief Complaint Chief Complaint  Patient presents with  . Emesis    HPI [name] is a 36 y.o. female with a PMHx of kidney stones, pancreatitis, DM, who presents to the Emergency Department complaining of emesis onset 6 AM this morning. Pt reports associated abdominal pain, nausea, decreased urine output, dysuria, flank pain, and constipation x 3 days ago. Pt notes that she has bowel movements daily, but notes that she takes daily tylenol-3 and has been taking it for 1 week. Pt has tried Rx tylenol-3 with no relief of her symptoms. Pt states that she had a surgery to her kidneys completed 3 months ago to have kidney stone removed. Pt states that she has a hx of pancreatitis, but denies her symptoms feeling similar. She denies fever, ha, cp, sob, blood in stool, diarrhea, vaginal discharge, vaginal bleeding, and any other symptoms. Pt notes that she has had a cholecystectomy, but still has her appendix.    The history is provided by the patient. No language interpreter was used.    Past Medical History:  Diagnosis Date  . Bilateral flank pain   . Bipolar 1 disorder (HCC)   . Claustrophobia   . Complication of anesthesia    claustrophobic- jittery   . Exercise-induced asthma   . Frequency of urination   . GERD (gastroesophageal reflux disease)    watches diet  . History of acute pancreatitis    04-19-2013;  12/ 2012;  09/ 2011  . History of kidney stones    since age 80  . History of recurrent UTIs   . Insulin dependent type 2 diabetes mellitus (HCC)   . Renal calculus, right   . Urgency of urination     Patient Active Problem List   Diagnosis Date Noted  . Pancreatitis 05/20/2013  . Duodenitis 05/20/2013  . 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 CYSTOSCOPY WITH RETROGRADE PYELOGRAM, URETEROSCOPY AND STENT PLACEMENT Bilateral 05/31/2016   Procedure: CYSTOSCOPY WITH BILATERAL RETROGRADE PYELOGRAM, RIGHT URETEROSCOPY, RIGHT STENT PLACEMENT, RIGHT LASER LITHOTRIPSY;  Surgeon: Sebastian Ache, MD;  Location: [facility];  Service: Urology;  Laterality: Bilateral;  . ERCP W/ SPHICTEROTOMY  01/28/2010  . EUS  08/02/2011   Procedure: UPPER ENDOSCOPIC ULTRASOUND (EUS) RADIAL;  Surgeon: Freddy Jaksch, MD;  Location: [facility];  Service: Endoscopy;  Laterality: N/A;  . FINE NEEDLE ASPIRATION  08/02/2011   Procedure: FINE NEEDLE ASPIRATION (FNA) RADIAL;  Surgeon: Freddy Jaksch, MD;  Location: [facility];  Service: Endoscopy;  Laterality: N/A;  . LAPAROSCOPIC CHOLECYSTECTOMY  07/25/2010    OB History    No data available       Home Medications    Prior to Admission medications   Medication Sig Start Date End Date Taking? Authorizing Provider  albuterol (PROVENTIL HFA;VENTOLIN HFA) 108 (90 Base) MCG/ACT inhaler Inhale 1-2 puffs into the lungs every 4 (four) hours as needed for wheezing or shortness of breath. 09/11/15   Arthor Captain, PA-C  ibuprofen (ADVIL,MOTRIN) 200 MG tablet Take 800 mg by mouth every 6 (six) hours as needed for moderate pain.     Historical  Provider, MD  insulin detemir (LEVEMIR) 100 UNIT/ML injection Inject 50 Units into the skin at bedtime.     Historical Provider, MD  insulin lispro (HUMALOG) 100 UNIT/ML injection Inject into the skin 4 (four) times daily. Sliding scale 16 units to 28units    Historical Provider, MD  oxyCODONE-acetaminophen (ROXICET) 5-325 MG tablet Take 1 tablet by mouth every 6 (six) hours as needed for moderate pain or severe pain. Post-operatively 05/31/16   Sebastian Acheheodore Manny, MD  QUEtiapine (SEROQUEL) 100 MG  tablet Take 100 mg by mouth at bedtime.    Historical Provider, MD  ranitidine (ZANTAC) 150 MG tablet Take 150 mg by mouth as needed for heartburn.    Historical Provider, MD  senna-docusate (SENOKOT-S) 8.6-50 MG tablet Take 1 tablet by mouth 2 (two) times daily. While taking strong pain meds to prevent constipation. 05/31/16   Sebastian Acheheodore Manny, MD  sertraline (ZOLOFT) 50 MG tablet Take 50 mg by mouth every morning.    Historical Provider, MD  sulfamethoxazole-trimethoprim (BACTRIM) 400-80 MG tablet Take 1 tablet by mouth 2 (two) times daily. X 3 days to prevent infection with stent in place 05/31/16   Sebastian Acheheodore Manny, MD    Family History No family history on file.  Social History Social History  Substance Use Topics  . Smoking status: Former Smoker    Years: 15.00    Types: Cigarettes    Quit date: 05/22/2012  . Smokeless tobacco: Never Used  . Alcohol use Yes     Comment: occasional     Allergies   Penicillins; Depakote er [divalproex sodium er]; and Lactose intolerance (gi)   Review of Systems Review of Systems  Constitutional: Negative for fever.  Gastrointestinal: Positive for abdominal pain, constipation, nausea and vomiting. Negative for blood in stool.  Genitourinary: Positive for decreased urine volume, dysuria and flank pain. Negative for vaginal bleeding and vaginal discharge.     Physical Exam Updated Vital Signs BP 108/84   Pulse 98   Temp 98.7 F (37.1 C) (Oral)   Resp 20   Ht 5\' 2"  (1.575 m)   Wt 148 lb (67.1 kg)   SpO2 100%   BMI 27.07 kg/m   Physical Exam  Constitutional: She is oriented to person, place, and time. She appears well-developed and well-nourished. No distress.  Non-toxic appearing.  HENT:  Head: Normocephalic and atraumatic.  Eyes: EOM are normal.  Neck: Neck supple.  Cardiovascular: Normal rate, regular rhythm and normal heart sounds.  Exam reveals no gallop and no friction rub.   No murmur heard. Pulmonary/Chest: Effort normal  and breath sounds normal. No respiratory distress. She has no wheezes. She has no rales.  Abdominal: Soft. She exhibits no distension. There is tenderness in the right upper quadrant and right lower quadrant. There is CVA tenderness (right). There is no rigidity, no rebound and no guarding.  Musculoskeletal: Normal range of motion.  Neurological: She is alert and oriented to person, place, and time.  Skin: Skin is warm and dry.  Psychiatric: She has a normal mood and affect. Her behavior is normal.  Nursing note and vitals reviewed.    ED Treatments / Results  DIAGNOSTIC STUDIES: Oxygen Saturation is 100% on RA, nl by my interpretation.    COORDINATION OF CARE: 7:29 PM Discussed treatment plan with pt at bedside which includes labs, UA, zofran, CT abdomen with pelvis, and pt agreed to plan.   Labs (all labs ordered are listed, but only abnormal results are displayed) Labs Reviewed  URINALYSIS, ROUTINE  W REFLEX MICROSCOPIC - Abnormal; Notable for the following:       Result Value   Specific Gravity, Urine 1.035 (*)    Protein, ur 30 (*)    All other components within normal limits  COMPREHENSIVE METABOLIC PANEL - Abnormal; Notable for the following:    Glucose, Bld 167 (*)    Calcium 8.7 (*)    All other components within normal limits  URINALYSIS, MICROSCOPIC (REFLEX) - Abnormal; Notable for the following:    Bacteria, UA RARE (*)    Squamous Epithelial / LPF 0-5 (*)    All other components within normal limits  PREGNANCY, URINE  LIPASE, BLOOD  CBC    Radiology Ct Abdomen Pelvis W Contrast  Result Date: 08/11/2016 CLINICAL DATA:  Nausea and abdominal pain EXAM: CT ABDOMEN AND PELVIS WITH CONTRAST TECHNIQUE: Multidetector CT imaging of the abdomen and pelvis was performed using the standard protocol following bolus administration of intravenous contrast. CONTRAST:  ISOVUE-300 IOPAMIDOL (ISOVUE-300) INJECTION 61% COMPARISON:  09/08/2014 FINDINGS: Lower chest: Tiny  granuloma is noted in the left lung base stable from the previous exam. No focal infiltrate or sizable effusion is seen. Hepatobiliary: No focal liver abnormality is seen. Status post cholecystectomy. No biliary dilatation. Pancreas: Unremarkable. No pancreatic ductal dilatation or surrounding inflammatory changes. Spleen: Normal in size without focal abnormality. Adrenals/Urinary Tract: No renal calculi are identified. Fullness of the renal pelves is noted bilaterally without definitive obstructive lesion. The bladder is well distended. Previously seen lower pole right renal stone is no longer identified. Stomach/Bowel: Stomach is within normal limits. Appendix appears normal. No evidence of bowel wall thickening, distention, or inflammatory changes. Vascular/Lymphatic: No significant vascular findings are present. No enlarged abdominal or pelvic lymph nodes. Reproductive: Uterus and bilateral adnexa are unremarkable. IUD is noted in place. Other: No abdominal wall hernia or abnormality. No abdominopelvic ascites. Musculoskeletal: No acute or significant osseous findings. IMPRESSION: Mild fullness of the renal collecting systems is noted proximally although no obstructing lesions are seen. No acute abnormality noted. Electronically Signed   By: Alcide Clever M.D.   On: 08/11/2016 21:01    Procedures Procedures (including critical care time)  Medications Ordered in ED Medications  ondansetron (ZOFRAN-ODT) disintegrating tablet 4 mg (4 mg Oral Given 08/11/16 1724)  sodium chloride 0.9 % bolus 1,000 mL (0 mLs Intravenous Stopped 08/11/16 2003)  iopamidol (ISOVUE-300) 61 % injection 100 mL (100 mLs Intravenous Contrast Given 08/11/16 2031)  ketorolac (TORADOL) 30 MG/ML injection 30 mg (30 mg Intramuscular Given 08/11/16 2123)     Initial Impression / Assessment and Plan / ED Course  I have reviewed the triage vital signs and the nursing notes.  Pertinent labs & imaging results that were available during my  care of the patient were reviewed by me and considered in my medical decision making (see chart for details).     Pt presents to the ED with several complaints including abdominal pain, nausea, decreased urine output, dysuria, flank pain, and constipation x 3 days ago. Labs have been reassuring. No leukocytosis. Creatine normal. UA with protein and trace bacteria along with squamous epithelium. No leukocytes, hgb, wbc, nitrites. Will not treat given UA findings for UTI. Pt states she is able to urinate but only a small amount comes out at a time. Pt offered morphine for pain but is a recovering narcotic pain medicine addiction. She is requesting tordol. Creatine is normal. Toradol IM given. Pt feels slightly improved after meds. Given flank pain and tenderness ordered  CT abd pelvis. No acute findings. Of note fullness in bilateral kidneys without any obstructions. Nonspecific. No signs of stranding or suspected pyelo or cystitis. Appendix is normal. Pt denies any pelvic pain or vaginal symptoms. No pelvic exam indicated. Symptoms could be due to recent kidney stone surgery. Bladder scan with 115 of urine. Do not feel that I&o cath will benefit pt. Pt feels like she can't empty her bladder. Spoke with Dr. Pecola Leisure conerning pt. She recs pyridium for bladder spasm. Pt is afebrile and not tachycardic. Pt improved. She has follow up appointment with her urologist next week. She is non toxic appearing and can ambulate with normal gait. Able to po challenge. Nausea improved with zofran. Pt is hemodynamically stable, in NAD, & able to ambulate in the ED. Pain has been managed & has no complaints prior to dc. Pt is comfortable with above plan and is stable for discharge at this time. All questions were answered prior to disposition. Strict return precautions for f/u to the ED were discussed.  Final Clinical Impressions(s) / ED Diagnoses   Final diagnoses:  Nausea and vomiting, intractability of vomiting not specified,  unspecified vomiting type  Abdominal pain, unspecified abdominal location    New Prescriptions Discharge Medication List as of 08/11/2016 10:59 PM    START taking these medications   Details  phenazopyridine (PYRIDIUM) 200 MG tablet Take 1 tablet (200 mg total) by mouth 3 (three) times daily as needed for pain., Starting Fri 08/11/2016, Print       I personally performed the services described in this documentation, which was scribed in my presence. The recorded information has been reviewed and is accurate.     Rise Mu, PA-C 08/14/16 1405    Tilden Fossa, MD 08/14/16 2325

## 2016-08-11 NOTE — ED Triage Notes (Signed)
Nausea, abdominal pain since 6am. States she has had only a small amount of urine output today. No BM in 3 days. She is taking narcotics.

## 2016-08-11 NOTE — Discharge Instructions (Signed)
Please follow up with urologist as soon as she can. May take the Pyridium for bladder spasms. If you develop any fevers return to the ED. Motrin and Tylenol for pain.

## 2016-08-11 NOTE — ED Notes (Signed)
Pt c/o lt arm burning from IV, fluid bolus stopped, no swelling or redness noted; pt has received if NS.

## 2016-08-11 NOTE — ED Notes (Signed)
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 Patient was on antibiotics within the past 30 days for a tooth/mouth infection. Pt has abdominal pain on right and left sides posterior w/ tenderness and guarding.

## 2016-09-26 ENCOUNTER — Encounter (HOSPITAL_BASED_OUTPATIENT_CLINIC_OR_DEPARTMENT_OTHER): Payer: Self-pay

## 2016-09-26 ENCOUNTER — Emergency Department (HOSPITAL_BASED_OUTPATIENT_CLINIC_OR_DEPARTMENT_OTHER)
Admission: EM | Admit: 2016-09-26 | Discharge: 2016-09-26 | Disposition: A | Discharge status: Home / Self Care | Payer: Medicaid Other | Attending: Physician Assistant | Admitting: Physician Assistant

## 2016-09-26 ENCOUNTER — Emergency Department (HOSPITAL_BASED_OUTPATIENT_CLINIC_OR_DEPARTMENT_OTHER): Payer: Medicaid Other

## 2016-09-26 DIAGNOSIS — R05 Cough: Secondary | ICD-10-CM | POA: Diagnosis present

## 2016-09-26 DIAGNOSIS — R1110 Vomiting, unspecified: Secondary | ICD-10-CM | POA: Insufficient documentation

## 2016-09-26 DIAGNOSIS — R197 Diarrhea, unspecified: Secondary | ICD-10-CM | POA: Insufficient documentation

## 2016-09-26 DIAGNOSIS — R109 Unspecified abdominal pain: Secondary | ICD-10-CM | POA: Diagnosis not present

## 2016-09-26 DIAGNOSIS — Z79899 Other long term (current) drug therapy: Secondary | ICD-10-CM | POA: Insufficient documentation

## 2016-09-26 DIAGNOSIS — Z87891 Personal history of nicotine dependence: Secondary | ICD-10-CM | POA: Diagnosis not present

## 2016-09-26 DIAGNOSIS — Z794 Long term (current) use of insulin: Secondary | ICD-10-CM | POA: Diagnosis not present

## 2016-09-26 DIAGNOSIS — J069 Acute upper respiratory infection, unspecified: Secondary | ICD-10-CM | POA: Insufficient documentation

## 2016-09-26 DIAGNOSIS — E119 Type 2 diabetes mellitus without complications: Secondary | ICD-10-CM | POA: Insufficient documentation

## 2016-09-26 LAB — BASIC METABOLIC PANEL
ANION GAP: 12 (ref 5–15)
BUN: 10 mg/dL (ref 6–20)
CHLORIDE: 102 mmol/L (ref 101–111)
CO2: 18 mmol/L — ABNORMAL LOW (ref 22–32)
Calcium: 9.5 mg/dL (ref 8.9–10.3)
Creatinine, Ser: 0.82 mg/dL (ref 0.44–1.00)
Glucose, Bld: 453 mg/dL — ABNORMAL HIGH (ref 65–99)
POTASSIUM: 3.6 mmol/L (ref 3.5–5.1)
SODIUM: 132 mmol/L — AB (ref 135–145)

## 2016-09-26 LAB — URINALYSIS, ROUTINE W REFLEX MICROSCOPIC
Bilirubin Urine: NEGATIVE
Glucose, UA: >=500 mg/dL — AB
Ketones, ur: NEGATIVE mg/dL
Leukocytes, UA: NEGATIVE
NITRITE: NEGATIVE
PROTEIN: 30 mg/dL — AB
SPECIFIC GRAVITY, URINE: 1.039 — AB (ref 1.005–1.030)
pH: 6.5 (ref 5.0–8.0)

## 2016-09-26 LAB — CBC WITH DIFFERENTIAL/PLATELET
BASOS ABS: 0 10*3/uL (ref 0.0–0.1)
BASOS PCT: 0 %
EOS ABS: 0 10*3/uL (ref 0.0–0.7)
Eosinophils Relative: 0 %
HCT: 39.9 % (ref 36.0–46.0)
HEMOGLOBIN: 14.2 g/dL (ref 12.0–15.0)
LYMPHS ABS: 1.3 10*3/uL (ref 0.7–4.0)
Lymphocytes Relative: 37 %
MCH: 29.7 pg (ref 26.0–34.0)
MCHC: 35.6 g/dL (ref 30.0–36.0)
MCV: 83.5 fL (ref 78.0–100.0)
Monocytes Absolute: 0.3 10*3/uL (ref 0.1–1.0)
Monocytes Relative: 10 %
NEUTROS PCT: 53 %
Neutro Abs: 1.9 10*3/uL (ref 1.7–7.7)
Platelets: 159 10*3/uL (ref 150–400)
RBC: 4.78 MIL/uL (ref 3.87–5.11)
RDW: 13.2 % (ref 11.5–15.5)
WBC: 3.6 10*3/uL — AB (ref 4.0–10.5)

## 2016-09-26 LAB — URINALYSIS, MICROSCOPIC (REFLEX): WBC, UA: NONE SEEN WBC/hpf (ref 0–5)

## 2016-09-26 LAB — CBG MONITORING, ED: GLUCOSE-CAPILLARY: 466 mg/dL — AB (ref 65–99)

## 2016-09-26 MED ORDER — SODIUM CHLORIDE 0.9 % IV BOLUS (SEPSIS)
1000.0000 mL | Freq: Once | INTRAVENOUS | Status: AC
  Administered 2016-09-26: 1000 mL via INTRAVENOUS

## 2016-09-26 MED ORDER — DOXYCYCLINE HYCLATE 100 MG PO CAPS: 100.0000 mg | ORAL_CAPSULE | Freq: Two times a day (BID) | ORAL | 0 refills | Status: DC

## 2016-09-26 MED ORDER — HYDROCODONE-HOMATROPINE 5-1.5 MG/5ML PO SYRP
5.0000 mL | ORAL_SOLUTION | Freq: Four times a day (QID) | ORAL | 0 refills | Status: DC | PRN
Start: 2016-09-26 — End: 2018-11-21

## 2016-09-26 MED ORDER — ACETAMINOPHEN 325 MG PO TABS
650.0000 mg | ORAL_TABLET | Freq: Once | ORAL | Status: AC
  Administered 2016-09-26: 650 mg via ORAL
  Filled 2016-09-26: qty 2

## 2016-09-26 MED ORDER — HYDROCODONE-HOMATROPINE 5-1.5 MG/5ML PO SYRP: 5.0000 mL | ORAL_SOLUTION | Freq: Four times a day (QID) | ORAL | 0 refills | Status: DC | PRN

## 2016-09-26 MED ORDER — HYDROCODONE-HOMATROPINE 5-1.5 MG/5ML PO SYRP
5.0000 mL | ORAL_SOLUTION | Freq: Once | ORAL | Status: DC
  Filled 2016-09-26: qty 5

## 2016-09-26 MED ORDER — AZITHROMYCIN 250 MG PO TABS: 250.0000 mg | ORAL_TABLET | Freq: Every day | ORAL | 0 refills | Status: DC

## 2016-09-26 NOTE — ED Notes (Signed)
Patient upset because she has had to wait- reports that she has "thrown up in my mask" - three times while she has been here. The patient is on the phone arguing in someone asking for them to come and pick her up since she has been for three hours. Patient offered something for nausea, but she states that she is not nauseous. Also she reports that she was sent her for her stage 1 kidney failure and that she is concerned about her kidneys. The patient reports that she only throws up after she coughs, and again states that she is not nauseous all the time. She reports that is having frequent loose stools and her dr sent her here because of that as well. Reports that they did not draw any blood or check here at her MD's but was sent straight her because of her kidneys. Patient educated about her blood work in the past with similar episodes, but responds "I am only telling you what my dr. Has told me"

## 2016-09-26 NOTE — ED Provider Notes (Signed)
[facility]   CSN: [id number] Arrival date & time: 09/26/16  1754   By signing my name below, I, Clarisse Gouge, attest that this documentation has been prepared under the direction and in the presence of Roxy Horseman, PA-C. Electronically Signed: Clarisse Gouge, Scribe. 09/26/16. 8:13 PM.  History   Chief Complaint Chief Complaint  Patient presents with  . Cough   The history is provided by the patient and medical records. No language interpreter was used.    HPI Comments: [name] is a 36 y.o. female with Hx of IDDM who presents to the Emergency Department complaining of worsening dry cough x 1 week.  Pt notes associated post tussive vomiting x 3 while waiting for evaluation in [facility], frequent diarrhea, abdominal pain, chest pain and decreased urinary output despite consuming 64 ounces of water today. Pt states she has taken mucinex, vitamin C and used a humidifier without relief. She adds baseline urinary incontinence. She also states she was diagnosed with a viral URI "last week". She also states "she has type 2 diabetes with type 1 diabetes symptoms". Pt denies nausea. Sick contacts noted at home with the flu.  She also expresses concern for her kidneys, noting she was sent here for stage 1 kidney failure. Pt states she is on losartan at home and followed by a urologist, a psychiatrist and an endocrinologist for multiple issues.  Past Medical History:  Diagnosis Date  . Bilateral flank pain   . Bipolar 1 disorder (HCC)   . Claustrophobia   . Complication of anesthesia    claustrophobic- jittery   . Exercise-induced asthma   . Frequency of urination   . GERD (gastroesophageal reflux disease)    watches diet  . History of acute pancreatitis    04-19-2013;  12/ 2012;  09/ 2011  . History of kidney stones    since age 36  . History of recurrent UTIs   . Insulin dependent type 2 diabetes mellitus (HCC)   . Renal calculus, right   . Urgency  of urination     Patient Active Problem List   Diagnosis Date Noted  . Pancreatitis 05/20/2013  . Duodenitis 05/20/2013  . 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 CYSTOSCOPY WITH RETROGRADE PYELOGRAM, URETEROSCOPY AND STENT PLACEMENT Bilateral 05/31/2016   Procedure: CYSTOSCOPY WITH BILATERAL RETROGRADE PYELOGRAM, RIGHT URETEROSCOPY, RIGHT STENT PLACEMENT, RIGHT LASER LITHOTRIPSY;  Surgeon: Sebastian Ache, MD;  Location: [facility];  Service: Urology;  Laterality: Bilateral;  . ERCP W/ SPHICTEROTOMY  01/28/2010  . EUS  08/02/2011   Procedure: UPPER ENDOSCOPIC ULTRASOUND (EUS) RADIAL;  Surgeon: Freddy Jaksch, MD;  Location: [facility];  Service: Endoscopy;  Laterality: N/A;  . FINE NEEDLE ASPIRATION  08/02/2011   Procedure: FINE NEEDLE ASPIRATION (FNA) RADIAL;  Surgeon: Freddy Jaksch, MD;  Location: [facility];  Service: Endoscopy;  Laterality: N/A;  . LAPAROSCOPIC CHOLECYSTECTOMY  07/25/2010    OB History    No data available       Home Medications    Prior to Admission medications   Medication Sig Start Date End Date Taking? Authorizing Provider  albuterol (PROVENTIL HFA;VENTOLIN HFA) 108 (90 Base) MCG/ACT inhaler Inhale 1-2 puffs into the lungs every 4 (four) hours as needed for wheezing or shortness of breath. 09/11/15  Yes Arthor Captain, PA-C  ibuprofen (ADVIL,MOTRIN) 200 MG tablet Take 800 mg by mouth every 6 (six) hours as needed for moderate pain.  Yes Historical Provider, MD  insulin detemir (LEVEMIR) 100 UNIT/ML injection Inject 80 Units into the skin at bedtime.    Yes Historical Provider, MD  insulin lispro (HUMALOG) 100 UNIT/ML injection Inject into the skin 4 (four) times daily. Sliding scale 16 units to 28units   Yes Historical Provider, MD  losartan (COZAAR) 25 MG tablet Take 25 mg by mouth daily.   Yes Historical Provider, MD  lurasidone (LATUDA) 40 MG TABS tablet Take 40 mg by mouth daily  with breakfast.   Yes Historical Provider, MD  ranitidine (ZANTAC) 150 MG tablet Take 150 mg by mouth as needed for heartburn.   Yes Historical Provider, MD  sertraline (ZOLOFT) 50 MG tablet Take 50 mg by mouth every morning.   Yes Historical Provider, MD  Topiramate (TOPAMAX PO) Take by mouth.   Yes Historical Provider, MD  oxyCODONE-acetaminophen (ROXICET) 5-325 MG tablet Take 1 tablet by mouth every 6 (six) hours as needed for moderate pain or severe pain. Post-operatively 05/31/16   Sebastian Acheheodore Manny, MD  phenazopyridine (PYRIDIUM) 200 MG tablet Take 1 tablet (200 mg total) by mouth 3 (three) times daily as needed for pain. 08/11/16   Rise MuKenneth T Leaphart, PA-C  QUEtiapine (SEROQUEL) 100 MG tablet Take 100 mg by mouth at bedtime.    Historical Provider, MD  senna-docusate (SENOKOT-S) 8.6-50 MG tablet Take 1 tablet by mouth 2 (two) times daily. While taking strong pain meds to prevent constipation. 05/31/16   Sebastian Acheheodore Manny, MD  sulfamethoxazole-trimethoprim (BACTRIM) 400-80 MG tablet Take 1 tablet by mouth 2 (two) times daily. X 3 days to prevent infection with stent in place 05/31/16   Sebastian Acheheodore Manny, MD    Family History No family history on file.  Social History Social History  Substance Use Topics  . Smoking status: Former Smoker    Years: 15.00    Types: Cigarettes    Quit date: 05/22/2012  . Smokeless tobacco: Never Used  . Alcohol use Yes     Comment: occasional     Allergies   Penicillins; Depakote er [divalproex sodium er]; and Lactose intolerance (gi)   Review of Systems Review of Systems  Constitutional: Positive for appetite change.  Respiratory: Positive for cough and chest tightness.   Cardiovascular: Positive for chest pain.  Gastrointestinal: Positive for abdominal pain, diarrhea and vomiting. Negative for nausea.  Genitourinary: Positive for decreased urine volume.  Allergic/Immunologic: Positive for immunocompromised state.  All other systems reviewed and are  negative.    Physical Exam Updated Vital Signs BP (!) 134/96 (BP Location: Left Arm)   Pulse (!) 106   Temp 98.8 F (37.1 C) (Oral)   Resp 20   Ht 5\' 3"  (1.6 m)   Wt 150 lb (68 kg)   LMP 09/24/2016 Comment: pt is on topamax  SpO2 100%   BMI 26.57 kg/m   Physical Exam Physical Exam  Constitutional: Pt  is oriented to person, place, and time. Appears well-developed and well-nourished. No distress.  HENT:  Head: Normocephalic and atraumatic.  Right Ear: Tympanic membrane, external ear and ear canal normal.  Left Ear: Tympanic membrane, external ear and ear canal normal.  Nose: Mucosal edema and mild rhinorrhea present. No epistaxis. Right sinus exhibits no maxillary sinus tenderness and no frontal sinus tenderness. Left sinus exhibits no maxillary sinus tenderness and no frontal sinus tenderness.  Mouth/Throat: Uvula is midline and mucous membranes are normal. Mucous membranes are not pale and not cyanotic. No oropharyngeal exudate, posterior oropharyngeal edema, posterior oropharyngeal erythema or tonsillar  abscesses.  Eyes: Conjunctivae are normal. Pupils are equal, round, and reactive to light.  Neck: Normal range of motion and full passive range of motion without pain.  Cardiovascular: Normal rate and intact distal pulses.   Pulmonary/Chest: Effort normal and breath sounds normal. No stridor.  Clear and equal breath sounds without focal wheezes, rhonchi, rales  Abdominal: Soft. Bowel sounds are normal. There is no tenderness.  Musculoskeletal: Normal range of motion.  Lymphadenopathy:    Pthas no cervical adenopathy.  Neurological: Pt is alert and oriented to person, place, and time.  Skin: Skin is warm and dry. No rash noted. Pt is not diaphoretic.  Psychiatric: Normal mood and affect.  Nursing note and vitals reviewed.    ED Treatments / Results  DIAGNOSTIC STUDIES: Oxygen Saturation is 100% on RA, NL by my interpretation.    COORDINATION OF CARE: 8:09 PM Discussed  treatment plan with pt at bedside and pt agreed to plan. Will order blood work and fluids.  Labs (all labs ordered are listed, but only abnormal results are displayed) Labs Reviewed  CBC WITH DIFFERENTIAL/PLATELET  BASIC METABOLIC PANEL  URINALYSIS, ROUTINE W REFLEX MICROSCOPIC  CBG MONITORING, ED    EKG  EKG Interpretation None       Radiology Dg Chest 2 View  Result Date: 09/26/2016 CLINICAL DATA:  Cough, chest congestion for 5 days EXAM: CHEST  2 VIEW COMPARISON:  09/10/2015 FINDINGS: Cardiomediastinal silhouette is stable. No infiltrate or pleural effusion. No pulmonary edema. Bony thorax is unremarkable. IMPRESSION: No active cardiopulmonary disease. Electronically Signed   By: Natasha Mead M.D.   On: 09/26/2016 19:10    Procedures Procedures (including critical care time)  Medications Ordered in ED Medications  HYDROcodone-homatropine (HYCODAN) 5-1.5 MG/5ML syrup 5 mL (5 mLs Oral Not Given 09/26/16 2223)  acetaminophen (TYLENOL) tablet 650 mg (650 mg Oral Given 09/26/16 1809)  sodium chloride 0.9 % bolus 1,000 mL (0 mLs Intravenous Stopped 09/26/16 2206)     Initial Impression / Assessment and Plan / ED Course  I have reviewed the triage vital signs and the nursing notes.  Pertinent labs & imaging results that were available during my care of the patient were reviewed by me and considered in my medical decision making (see chart for details).     Patient with cough and posttussive emesis. She reports having URI symptoms for the past week. She reports that she has had difficulty controlling her blood sugar, and is followed closely by endocrinology, who is also having difficulty controlling her blood sugar. She denies any fevers, but does report some subjective chills. She was sent to the emergency department today by her primary care because she reported decreased urine volume. She has no evidence of kidney injury today. Her anion gap is 12. He has no evidence of UTI. I will  treat her URI with azithromycin given her length of illness. Recommend close follow-up with PCP.  Final Clinical Impressions(s) / ED Diagnoses   Final diagnoses:  Upper respiratory tract infection, unspecified type    New Prescriptions Discharge Medication List as of 09/26/2016 10:17 PM    START taking these medications   Details  doxycycline (VIBRAMYCIN) 100 MG capsule Take 1 capsule (100 mg total) by mouth 2 (two) times daily., Starting Tue 09/26/2016, Print    HYDROcodone-homatropine (HYCODAN) 5-1.5 MG/5ML syrup Take 5 mLs by mouth every 6 (six) hours as needed for cough., Starting Tue 09/26/2016, Print       I personally performed the services described in  this documentation, which was scribed in my presence. The recorded information has been reviewed and is accurate.      Roxy Horseman, PA-C 09/26/16 2243    Courteney Randall An, MD 09/27/16 [phone]

## 2016-09-26 NOTE — ED Triage Notes (Signed)
Pt c/o non-productive cough for a week that is not relieved by mucinex

## 2016-11-09 DIAGNOSIS — F3162 Bipolar disorder, current episode mixed, moderate: Secondary | ICD-10-CM | POA: Insufficient documentation

## 2017-03-21 DIAGNOSIS — Z87442 Personal history of urinary calculi: Secondary | ICD-10-CM | POA: Insufficient documentation

## 2017-10-01 DIAGNOSIS — R0789 Other chest pain: Secondary | ICD-10-CM | POA: Insufficient documentation

## 2017-10-31 DIAGNOSIS — Z794 Long term (current) use of insulin: Secondary | ICD-10-CM | POA: Insufficient documentation

## 2018-03-11 DIAGNOSIS — J4541 Moderate persistent asthma with (acute) exacerbation: Secondary | ICD-10-CM | POA: Insufficient documentation

## 2018-05-21 DIAGNOSIS — M6752 Plica syndrome, left knee: Secondary | ICD-10-CM | POA: Insufficient documentation

## 2018-05-21 DIAGNOSIS — S83002A Unspecified subluxation of left patella, initial encounter: Secondary | ICD-10-CM | POA: Insufficient documentation

## 2018-05-21 DIAGNOSIS — S8392XA Sprain of unspecified site of left knee, initial encounter: Secondary | ICD-10-CM | POA: Insufficient documentation

## 2018-05-24 DIAGNOSIS — F4481 Dissociative identity disorder: Secondary | ICD-10-CM | POA: Insufficient documentation

## 2018-06-17 DIAGNOSIS — M94262 Chondromalacia, left knee: Secondary | ICD-10-CM | POA: Insufficient documentation

## 2018-11-15 ENCOUNTER — Ambulatory Visit (HOSPITAL_COMMUNITY): Payer: Medicaid Other | Admitting: Certified Registered Nurse Anesthetist

## 2018-11-15 ENCOUNTER — Encounter (HOSPITAL_COMMUNITY): Payer: Self-pay

## 2018-11-15 ENCOUNTER — Encounter (HOSPITAL_COMMUNITY)
Admission: AD | Disposition: A | Discharge status: Home / Self Care | Payer: Self-pay | Source: Home / Self Care | Attending: Urology

## 2018-11-15 ENCOUNTER — Other Ambulatory Visit: Payer: Self-pay

## 2018-11-15 ENCOUNTER — Other Ambulatory Visit: Payer: Self-pay | Admitting: Urology

## 2018-11-15 ENCOUNTER — Ambulatory Visit (HOSPITAL_COMMUNITY): Payer: Medicaid Other

## 2018-11-15 ENCOUNTER — Other Ambulatory Visit (HOSPITAL_COMMUNITY)
Admission: RE | Admit: 2018-11-15 | Discharge: 2018-11-15 | Disposition: A | Discharge status: Home / Self Care | Payer: Medicaid Other | Source: Ambulatory Visit | Attending: Urology | Admitting: Urology

## 2018-11-15 ENCOUNTER — Observation Stay (HOSPITAL_COMMUNITY)
Admission: AD | Admit: 2018-11-15 | Discharge: 2018-11-16 | Disposition: A | Discharge status: Home / Self Care | Payer: Medicaid Other | Attending: Urology | Admitting: Urology

## 2018-11-15 DIAGNOSIS — E1140 Type 2 diabetes mellitus with diabetic neuropathy, unspecified: Secondary | ICD-10-CM | POA: Insufficient documentation

## 2018-11-15 DIAGNOSIS — Z1159 Encounter for screening for other viral diseases: Secondary | ICD-10-CM | POA: Insufficient documentation

## 2018-11-15 DIAGNOSIS — Z87442 Personal history of urinary calculi: Secondary | ICD-10-CM | POA: Diagnosis not present

## 2018-11-15 DIAGNOSIS — K219 Gastro-esophageal reflux disease without esophagitis: Secondary | ICD-10-CM | POA: Insufficient documentation

## 2018-11-15 DIAGNOSIS — F319 Bipolar disorder, unspecified: Secondary | ICD-10-CM | POA: Diagnosis not present

## 2018-11-15 DIAGNOSIS — Z794 Long term (current) use of insulin: Secondary | ICD-10-CM | POA: Diagnosis not present

## 2018-11-15 DIAGNOSIS — J45909 Unspecified asthma, uncomplicated: Secondary | ICD-10-CM | POA: Insufficient documentation

## 2018-11-15 DIAGNOSIS — N200 Calculus of kidney: Secondary | ICD-10-CM | POA: Diagnosis present

## 2018-11-15 DIAGNOSIS — Z79899 Other long term (current) drug therapy: Secondary | ICD-10-CM | POA: Diagnosis not present

## 2018-11-15 DIAGNOSIS — Z880 Allergy status to penicillin: Secondary | ICD-10-CM | POA: Diagnosis not present

## 2018-11-15 DIAGNOSIS — Z888 Allergy status to other drugs, medicaments and biological substances status: Secondary | ICD-10-CM | POA: Diagnosis not present

## 2018-11-15 DIAGNOSIS — N201 Calculus of ureter: Principal | ICD-10-CM | POA: Insufficient documentation

## 2018-11-15 DIAGNOSIS — F419 Anxiety disorder, unspecified: Secondary | ICD-10-CM | POA: Insufficient documentation

## 2018-11-15 DIAGNOSIS — Z87891 Personal history of nicotine dependence: Secondary | ICD-10-CM | POA: Diagnosis not present

## 2018-11-15 DIAGNOSIS — Z87892 Personal history of anaphylaxis: Secondary | ICD-10-CM | POA: Diagnosis not present

## 2018-11-15 HISTORY — PX: CYSTOSCOPY W/ URETERAL STENT PLACEMENT: SHX1429

## 2018-11-15 LAB — GLUCOSE, CAPILLARY
Glucose-Capillary: 86 mg/dL (ref 70–99)
Glucose-Capillary: 83 mg/dL (ref 70–99)
Glucose-Capillary: 142 mg/dL — ABNORMAL HIGH (ref 70–99)

## 2018-11-15 LAB — SARS CORONAVIRUS 2 BY RT PCR (HOSPITAL ORDER, PERFORMED IN ~~LOC~~ HOSPITAL LAB): SARS Coronavirus 2: NEGATIVE

## 2018-11-15 SURGERY — CYSTOSCOPY, WITH RETROGRADE PYELOGRAM AND URETERAL STENT INSERTION
Anesthesia: General | Site: Ureter | Laterality: Right

## 2018-11-15 MED ORDER — ONDANSETRON HCL 4 MG/2ML IJ SOLN: 4.0000 mg | INTRAMUSCULAR | Status: DC | PRN

## 2018-11-15 MED ORDER — DIPHENHYDRAMINE HCL 12.5 MG/5ML PO ELIX: 12.5000 mg | ORAL_SOLUTION | Freq: Four times a day (QID) | ORAL | Status: DC | PRN

## 2018-11-15 MED ORDER — ZOLPIDEM TARTRATE 5 MG PO TABS: 5.0000 mg | ORAL_TABLET | Freq: Every evening | ORAL | Status: DC | PRN

## 2018-11-15 MED ORDER — GENTAMICIN SULFATE 40 MG/ML IJ SOLN
280.0000 mg | INTRAVENOUS | Status: AC
  Administered 2018-11-15: 280 mg via INTRAVENOUS
  Filled 2018-11-15: qty 7

## 2018-11-15 MED ORDER — IOPAMIDOL (ISOVUE-300) INJECTION 61%
INTRAVENOUS | Status: DC | PRN
  Administered 2018-11-15: 50 mL via URETHRAL

## 2018-11-15 MED ORDER — LACTATED RINGERS IV SOLN
INTRAVENOUS | Status: DC | PRN
  Administered 2018-11-15: 17:00:00 via INTRAVENOUS

## 2018-11-15 MED ORDER — TRAZODONE HCL 50 MG PO TABS
150.0000 mg | ORAL_TABLET | Freq: Every day | ORAL | Status: DC
  Administered 2018-11-15: 150 mg via ORAL
  Filled 2018-11-15: qty 1

## 2018-11-15 MED ORDER — ASENAPINE MALEATE 5 MG SL SUBL
5.0000 mg | SUBLINGUAL_TABLET | Freq: Every day | SUBLINGUAL | Status: DC
  Administered 2018-11-16: 5 mg via SUBLINGUAL
  Filled 2018-11-15: qty 1

## 2018-11-15 MED ORDER — PROPOFOL 10 MG/ML IV BOLUS
INTRAVENOUS | Status: DC | PRN
  Administered 2018-11-15: 160 mg via INTRAVENOUS

## 2018-11-15 MED ORDER — OXYCODONE HCL 5 MG PO TABS: 5.0000 mg | ORAL_TABLET | Freq: Once | ORAL | Status: DC | PRN

## 2018-11-15 MED ORDER — DEXAMETHASONE SODIUM PHOSPHATE 10 MG/ML IJ SOLN
INTRAMUSCULAR | Status: AC
  Filled 2018-11-15: qty 1

## 2018-11-15 MED ORDER — FENTANYL CITRATE (PF) 100 MCG/2ML IJ SOLN: 25.0000 ug | INTRAMUSCULAR | Status: DC | PRN

## 2018-11-15 MED ORDER — SENNA 8.6 MG PO TABS
1.0000 | ORAL_TABLET | Freq: Two times a day (BID) | ORAL | Status: DC
  Administered 2018-11-15 – 2018-11-16 (×2): 8.6 mg via ORAL
  Filled 2018-11-15 (×2): qty 1

## 2018-11-15 MED ORDER — LIDOCAINE 2% (20 MG/ML) 5 ML SYRINGE
INTRAMUSCULAR | Status: DC | PRN
  Administered 2018-11-15: 50 mg via INTRAVENOUS

## 2018-11-15 MED ORDER — SODIUM CHLORIDE 0.9 % IV SOLN
INTRAVENOUS | Status: DC
  Administered 2018-11-15 – 2018-11-16 (×2): via INTRAVENOUS

## 2018-11-15 MED ORDER — DIPHENHYDRAMINE HCL 50 MG/ML IJ SOLN: 12.5000 mg | Freq: Four times a day (QID) | INTRAMUSCULAR | Status: DC | PRN

## 2018-11-15 MED ORDER — ONDANSETRON HCL 4 MG/2ML IJ SOLN
4.0000 mg | Freq: Once | INTRAMUSCULAR | Status: AC | PRN
  Administered 2018-11-15: 4 mg via INTRAVENOUS

## 2018-11-15 MED ORDER — DEXAMETHASONE SODIUM PHOSPHATE 10 MG/ML IJ SOLN
INTRAMUSCULAR | Status: DC | PRN
  Administered 2018-11-15: 8 mg via INTRAVENOUS

## 2018-11-15 MED ORDER — LURASIDONE HCL 40 MG PO TABS
40.0000 mg | ORAL_TABLET | Freq: Every day | ORAL | Status: DC
  Filled 2018-11-15: qty 1

## 2018-11-15 MED ORDER — FAMOTIDINE 20 MG PO TABS
10.0000 mg | ORAL_TABLET | Freq: Every day | ORAL | Status: DC
  Administered 2018-11-16: 10 mg via ORAL
  Filled 2018-11-15: qty 1

## 2018-11-15 MED ORDER — LIDOCAINE 2% (20 MG/ML) 5 ML SYRINGE
INTRAMUSCULAR | Status: AC
  Filled 2018-11-15: qty 5

## 2018-11-15 MED ORDER — VORTIOXETINE HBR 20 MG PO TABS
20.0000 mg | ORAL_TABLET | Freq: Every day | ORAL | Status: DC
  Administered 2018-11-15: 22:00:00 20 mg via ORAL
  Filled 2018-11-15: qty 1

## 2018-11-15 MED ORDER — ALPRAZOLAM 0.25 MG PO TABS: 0.2500 mg | ORAL_TABLET | Freq: Every day | ORAL | Status: DC | PRN

## 2018-11-15 MED ORDER — INSULIN ASPART 100 UNIT/ML ~~LOC~~ SOLN
0.0000 [IU] | Freq: Three times a day (TID) | SUBCUTANEOUS | Status: DC
  Administered 2018-11-16: 08:00:00 11 [IU] via SUBCUTANEOUS

## 2018-11-15 MED ORDER — LORATADINE 10 MG PO TABS
10.0000 mg | ORAL_TABLET | Freq: Every day | ORAL | Status: DC
  Administered 2018-11-16: 08:00:00 10 mg via ORAL
  Filled 2018-11-15: qty 1

## 2018-11-15 MED ORDER — CIPROFLOXACIN HCL 500 MG PO TABS
500.0000 mg | ORAL_TABLET | Freq: Two times a day (BID) | ORAL | Status: DC
  Administered 2018-11-15 – 2018-11-16 (×2): 500 mg via ORAL
  Filled 2018-11-15 (×2): qty 1

## 2018-11-15 MED ORDER — OXYCODONE HCL 5 MG PO TABS
5.0000 mg | ORAL_TABLET | ORAL | Status: DC | PRN
  Administered 2018-11-16 (×2): 5 mg via ORAL
  Filled 2018-11-15 (×4): qty 1

## 2018-11-15 MED ORDER — ACETAMINOPHEN 325 MG PO TABS: 650.0000 mg | ORAL_TABLET | ORAL | Status: DC | PRN

## 2018-11-15 MED ORDER — OXYBUTYNIN CHLORIDE 5 MG PO TABS: 5.0000 mg | ORAL_TABLET | Freq: Three times a day (TID) | ORAL | Status: DC | PRN

## 2018-11-15 MED ORDER — LOSARTAN POTASSIUM 25 MG PO TABS: 25.0000 mg | ORAL_TABLET | Freq: Every day | ORAL | Status: DC

## 2018-11-15 MED ORDER — DOCUSATE SODIUM 100 MG PO CAPS
100.0000 mg | ORAL_CAPSULE | Freq: Two times a day (BID) | ORAL | Status: DC
  Administered 2018-11-15 – 2018-11-16 (×2): 100 mg via ORAL
  Filled 2018-11-15 (×2): qty 1

## 2018-11-15 MED ORDER — GABAPENTIN 300 MG PO CAPS
1200.0000 mg | ORAL_CAPSULE | Freq: Every day | ORAL | Status: DC
  Administered 2018-11-15: 22:00:00 1200 mg via ORAL
  Filled 2018-11-15: qty 4

## 2018-11-15 MED ORDER — MIDAZOLAM HCL 5 MG/5ML IJ SOLN
INTRAMUSCULAR | Status: DC | PRN
  Administered 2018-11-15: 2 mg via INTRAVENOUS

## 2018-11-15 MED ORDER — BELLADONNA ALKALOIDS-OPIUM 16.2-60 MG RE SUPP: 1.0000 | Freq: Four times a day (QID) | RECTAL | Status: DC | PRN

## 2018-11-15 MED ORDER — TRAZODONE HCL 50 MG PO TABS: 150.0000 mg | ORAL_TABLET | Freq: Every day | ORAL | Status: DC

## 2018-11-15 MED ORDER — SODIUM CHLORIDE 0.9 % IR SOLN
Status: DC | PRN
  Administered 2018-11-15: 3000 mL

## 2018-11-15 MED ORDER — ONDANSETRON HCL 4 MG/2ML IJ SOLN
INTRAMUSCULAR | Status: AC
  Filled 2018-11-15: qty 2

## 2018-11-15 MED ORDER — LEVOCETIRIZINE DIHYDROCHLORIDE 5 MG PO TABS: 10.0000 mg | ORAL_TABLET | Freq: Every day | ORAL | Status: DC

## 2018-11-15 MED ORDER — ALBUTEROL SULFATE (2.5 MG/3ML) 0.083% IN NEBU: 2.5000 mg | INHALATION_SOLUTION | RESPIRATORY_TRACT | Status: DC | PRN

## 2018-11-15 MED ORDER — MIDAZOLAM HCL 2 MG/2ML IJ SOLN
INTRAMUSCULAR | Status: AC
  Filled 2018-11-15: qty 2

## 2018-11-15 MED ORDER — MORPHINE SULFATE (PF) 2 MG/ML IV SOLN
2.0000 mg | INTRAVENOUS | Status: DC | PRN
  Administered 2018-11-15 (×2): 2 mg via INTRAVENOUS
  Filled 2018-11-15 (×2): qty 1

## 2018-11-15 MED ORDER — TRAZODONE HCL 50 MG PO TABS: 50.0000 mg | ORAL_TABLET | Freq: Every day | ORAL | Status: DC

## 2018-11-15 MED ORDER — FENTANYL CITRATE (PF) 100 MCG/2ML IJ SOLN
INTRAMUSCULAR | Status: DC | PRN
  Administered 2018-11-15 (×4): 25 ug via INTRAVENOUS

## 2018-11-15 MED ORDER — LACTATED RINGERS IV SOLN: INTRAVENOUS | Status: DC

## 2018-11-15 MED ORDER — OXYCODONE HCL 5 MG/5ML PO SOLN: 5.0000 mg | Freq: Once | ORAL | Status: DC | PRN

## 2018-11-15 MED ORDER — FENTANYL CITRATE (PF) 100 MCG/2ML IJ SOLN
INTRAMUSCULAR | Status: AC
  Filled 2018-11-15: qty 2

## 2018-11-15 MED ORDER — ASENAPINE MALEATE 5 MG SL SUBL
10.0000 mg | SUBLINGUAL_TABLET | Freq: Every day | SUBLINGUAL | Status: DC
  Administered 2018-11-15: 10 mg via SUBLINGUAL
  Filled 2018-11-15: qty 2

## 2018-11-15 MED ORDER — PROPOFOL 10 MG/ML IV BOLUS
INTRAVENOUS | Status: AC
  Filled 2018-11-15: qty 20

## 2018-11-15 SURGICAL SUPPLY — 14 items
BAG URO CATCHER STRL LF (MISCELLANEOUS) ×3 IMPLANT
CATH INTERMIT  6FR 70CM (CATHETERS) ×3 IMPLANT
CLOTH BEACON ORANGE TIMEOUT ST (SAFETY) ×3 IMPLANT
COVER WAND RF STERILE (DRAPES) IMPLANT
GLOVE BIO SURGEON STRL SZ7.5 (GLOVE) ×3 IMPLANT
GOWN STRL REUS W/TWL LRG LVL3 (GOWN DISPOSABLE) ×6 IMPLANT
GUIDEWIRE STR DUAL SENSOR (WIRE) ×3 IMPLANT
KIT TURNOVER KIT A (KITS) ×2 IMPLANT
MANIFOLD NEPTUNE II (INSTRUMENTS) ×3 IMPLANT
PACK CYSTO (CUSTOM PROCEDURE TRAY) ×3 IMPLANT
STENT URET 6FRX24 CONTOUR (STENTS) ×2 IMPLANT
TUBING CONNECTING 10 (TUBING) ×2 IMPLANT
TUBING CONNECTING 10' (TUBING) ×1
TUBING UROLOGY SET (TUBING) IMPLANT

## 2018-11-15 NOTE — Anesthesia Procedure Notes (Signed)
Procedure Name: LMA Insertion Date/Time: 11/15/2018 4:55 PM Performed by: Paris Lore, CRNA Pre-anesthesia Checklist: Patient identified, Emergency Drugs available, Suction available, Patient being monitored and Timeout performed Patient Re-evaluated:Patient Re-evaluated prior to induction Oxygen Delivery Method: Circle system utilized Preoxygenation: Pre-oxygenation with 100% oxygen Induction Type: IV induction Ventilation: Mask ventilation without difficulty LMA: LMA inserted LMA Size: 4.0 Number of attempts: 1 Placement Confirmation: positive ETCO2 and breath sounds checked- equal and bilateral Tube secured with: Tape

## 2018-11-15 NOTE — H&P (Signed)
CC/HPI: 1 - Recurrent cystitis - few episodes per year simple cystitis. One febrile episode 04/2016 managed as outpaient. No clonal CX's on file ever. No pyelo hospitalizations. She diabetic on insulin and oral agents and admittedly "sugars crazy sometimes", massive glycosuria off the chart on UA x many and CBGs 300+ x several. Pelvic 08/2015 normal. PVR normal x several. No post-coital pattern. CT with Rt renal stones, now treated.   2 - Recurrent Nephrolithiasis - s/p RIGHT ureteroscopy 05/2106 for large 70mm renal stone with suspect intermittent obstruction. Composition 100% CaOx. Has passed several small stones medically prior, never surgically treated. Has IUD, not seeking pregnancy.   Recent Surveillance:  28/2018 - CT - stone free  05/2018 KUB, RUS - Likely 71mm RLP stone and non-complex cyst (1.8cm), no hydronephrosis   3 - Metabolic Stone Disease -  Eval 2018: BMP, PTH, Urate - normal; Composition - 100% CaOx; 24 Hr Urines - did not complete.   4 - Urinary Urgency / Frequency - pt with c/o frequent urination. She is diabetic with large glycosuria x many. Symptoms persist even when UCX negative. PVR 2018 "33mL" (normal) MAY 2020 A1c 9.9. Pt on insulin and sees endocrinology. Epic notes indicate she is poorly compliant with losartan and atorvastatin. She has associated frequency and nocturia.   5 - Non-Complex Right Renal Cyst - 1.8cm right lower pole cyst by contrast CT 2018. No mass effect / coarse calcifications / enhancing nodules.   PMH sig for Bipolar, IDDM2 with LE neuropathy (follows Debara Pickett), lap chole. Her PCP is Beverley Fiedler MD.    11/15/2018: Returns today for evaluation of possible obstructing kidney stone.  C/o right lower back pain radiating into the right shoulder and into the right flank and groin. Symptoms began 2 weeks ago per the patient. Associated with increased frequency/urgency. She endorses dysuria. No gross hematuria but is stating urine has been very dark.  Denies f/c but states being nauseated. She has a low grade fever here today and is mildly tachycardic. UA is suspicious for infection.     ALLERGIES: Depakote - Dizziness, due to hx of pancreatitis Penicillins Pristiq    MEDICATIONS: Benadryl TABS Oral  Gabapentin  Humulin R U-500 Kwikpen  Melatonin 3 MG Oral Tablet Oral  Multi-Vitamin With Minerals 7.5 mg iron-400 mcg tablet Oral  Saphris 5 mg tablet, sublingual 1 tablet PO QAM  Saphris 10 mg tablet, sublingual  Trintellix 20 mg tablet tablet  Xanax     GU PSH: Ureteroscopic laser litho, Right - 05/31/2016      PSH Notes: Cholecystectomy     NON-GU PSH: Cholecystectomy (open) - 2017 Knee Arthroscopy/surgery, Left    GU PMH: Renal cyst (Stable, Chronic), Right - 06/04/2018 Chronic cystitis (w/o hematuria), Chronic cystitis - 2017 Renal calculus, Nephrolithiasis - 2017      PMH Notes:  Neuropathy   NON-GU PMH: Glycosuria (Stable, Chronic) - 2018 Anxiety, Anxiety - 2017 Encounter for general adult medical examination without abnormal findings, Encounter for preventive health examination - 2017 Personal history of other diseases of the digestive system, History of esophageal reflux - 2017 Personal history of other diseases of the nervous system and sense organs, History of neuropathy - 2017 Personal history of other endocrine, nutritional and metabolic disease, History of diabetes mellitus - 2017 Personal history of other mental and behavioral disorders, History of depression - 2017 Cervical low 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 Has not smoked since 04/02/2012.  Has never drank.  Drinks 2  caffeinated drinks per day. 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    REVIEW OF SYSTEMS:    GU Review Female:   Patient reports frequent urination, hard to postpone urination, burning /pain with urination, get up at night to urinate, leakage of urine, trouble starting your stream, and have to strain to urinate. Patient denies stream starts and stops and being pregnant.  Gastrointestinal (Upper):   Patient denies nausea, vomiting, and indigestion/ heartburn.  Gastrointestinal (Lower):   Patient denies diarrhea and constipation.  Constitutional:   Patient reports fatigue. Patient denies fever, night sweats, and weight loss.  Skin:   Patient denies skin rash/ lesion and itching.  Eyes:   Patient denies blurred vision and double vision.  Ears/ Nose/ Throat:   Patient denies sore throat and sinus problems.  Hematologic/Lymphatic:   Patient denies swollen glands and easy bruising.  Cardiovascular:   Patient denies leg swelling and chest pains.  Respiratory:   Patient denies shortness of breath and cough.  Endocrine:   Patient denies excessive thirst.  Musculoskeletal:   Patient reports back pain and joint pain.   Neurological:   Patient denies headaches and dizziness.  Psychologic:   Patient denies depression and anxiety.   Notes: right side low back pain    VITAL SIGNS:      11/15/2018 12:45 PM  Weight 153 lb / 69.4 kg  Height 60 in / 152.4 cm  BP 130/88 mmHg  Pulse 109 /min  Temperature 99.8 F / 37.6 C  BMI 29.9 kg/m   MULTI-SYSTEM PHYSICAL EXAMINATION:    Constitutional: Well-nourished. No physical deformities. Normally developed. Good grooming.  Neck: Neck symmetrical, not swollen. Normal tracheal position.  Respiratory: No labored breathing, no use of accessory muscles.   Cardiovascular: Normal temperature, normal extremity pulses, no swelling, no varicosities.  Skin: No paleness, no jaundice, no cyanosis. No lesion, no ulcer, no rash.  Neurologic / Psychiatric: Oriented to time,  oriented to place, oriented to person. No depression, no anxiety, no agitation.  Gastrointestinal: No mass, no tenderness, no rigidity, non obese abdomen. No CVA, flank, or lower abdominal tenderness.  Musculoskeletal: Normal gait and station of head and neck.     PAST DATA REVIEWED:  Source Of History:  Patient, Medical Record Summary  Records Review:   Previous Doctor Records, Previous Patient Records  Urine Test Review:   Urinalysis  X-Ray Review: KUB: Reviewed Films.  Renal Ultrasound: Reviewed Films.  C.T. Stone Protocol: Reviewed Films. Discussed With Patient.     11/15/18  Urinalysis  Urine Appearance Cloudy   Urine Color Amber   Urine Glucose 1+ mg/dL  Urine Bilirubin Neg mg/dL  Urine Ketones Neg mg/dL  Urine Specific Gravity 1.025   Urine Blood 2+ ery/uL  Urine pH 6.5   Urine Protein 3+ mg/dL  Urine Urobilinogen 0.2 mg/dL  Urine Nitrites Positive   Urine Leukocyte Esterase 3+ leu/uL  Urine WBC/hpf >60/hpf   Urine RBC/hpf 10 - 20/hpf   Urine Epithelial Cells NS (Not Seen)   Urine Bacteria Many (>50/hpf)   Urine Mucous Not Present   Urine Yeast NS (Not Seen)   Urine Trichomonas Not Present   Urine Cystals NS (Not Seen)   Urine Casts NS (Not Seen)  Urine Sperm Not Present    PROCEDURES:         C.T. Urogram - [id number]      Patient confirmed No Neulasta OnPro Device.           Urinalysis w/Scope Dipstick Dipstick Cont'd Micro  Color: Amber Bilirubin: Neg mg/dL WBC/hpf: >16/XWR  Appearance: Cloudy Ketones: Neg mg/dL RBC/hpf: 10 - 60/AVW  Specific Gravity: 1.025 Blood: 2+ ery/uL Bacteria: Many (>50/hpf)  pH: 6.5 Protein: 3+ mg/dL Cystals: NS (Not Seen)  Glucose: 1+ mg/dL Urobilinogen: 0.2 mg/dL Casts: NS (Not Seen)    Nitrites: Positive Trichomonas: Not Present    Leukocyte Esterase: 3+ leu/uL Mucous: Not Present      Epithelial Cells: NS (Not Seen)      Yeast: NS (Not Seen)      Sperm: Not Present    ASSESSMENT:      ICD-10 Details  1 GU:   Renal  calculus - N20.0   2   Renal colic - N23    PLAN:            Medications New Meds: Cipro 500 mg tablet 1 tablet PO BID   #20  0 Refill(s)    Stop Meds: Humulin 70-30  Discontinue: 11/15/2018  - Reason: The medication cycle was completed.            Orders Labs Urine Culture  X-Rays: C.T. Stone Protocol Without Contrast - Has IUD  X-Ray Notes: History:  Hematuria: Yes/No  Patient to see MD after exam: Yes/No  Previous exam: CT / IVP/ US/ KUB/ None  When:  Where:  Diabetic: Yes/ No  BUN/ Creatine:  Date of last BUN Creatinine:  Weight in pounds:  Allergy- Contrasts/ Shellfish: Yes/ No  Conflicting diabetic meds: Yes/ No  Oral contrast and instructions given to patient:   Prior Authorization #: [id number] valid 11/15/18 thru 05/14/19            Schedule Return Visit/Planned Activity: 1-2 Weeks - Follow up MD  Return Visit/Planned Activity: ASAP - Schedule Surgery          Document Letter(s):  Created for Patient: Clinical Summary         Notes:   Obstructing right proximal ureteral calculi with low grade fever and UA very suspicious for associated infection. She will need urgent ureteral stent placement tonight. She last drank water at 12:30 and last ate yesterday. I reviewed case with Dr Alvester Morin. I'll go ahead and prescribe abx treatment to begin post procedure and f/u about urine c/s results. Risks, benefits of the procedure discussed in detail with understanding expressed by the patient. All questions answered. She will proceed [facility] to [facility] for Covid-19 testing and then to admitting for pre-operative work up. She will need f/u with Dr Berneice Heinrich in 1-2 weeks for discussion about definitive stone management.   CC Dr Berneice Heinrich  CC Dr Alvester Morin   Signed by Anne Fu on 11/15/18 at 1:59 PM (EDT

## 2018-11-15 NOTE — Anesthesia Postprocedure Evaluation (Signed)
Anesthesia Post Note  Patient: [name]  Procedure(s) Performed: CYSTOSCOPY WITH RETROGRADE PYELOGRAM/URETERAL STENT PLACEMENT (Right Ureter)     Patient location during evaluation: PACU Anesthesia Type: General Level of consciousness: awake and alert Pain management: pain level controlled Vital Signs Assessment: post-procedure vital signs reviewed and stable Respiratory status: spontaneous breathing, nonlabored ventilation and respiratory function stable Cardiovascular status: blood pressure returned to baseline and stable Postop Assessment: no apparent nausea or vomiting Anesthetic complications: no    Last Vitals:  Vitals:   11/15/18 1812 11/15/18 1826  BP: 129/84 133/86  Pulse: 72 83  Resp: 15 19  Temp: (!) 36.4 C 37 C  SpO2: 94% 99%     Lucretia Kern

## 2018-11-15 NOTE — Transfer of Care (Signed)
Immediate Anesthesia Transfer of Care Note  Patient: [name]  Procedure(s) Performed: Procedure(s): CYSTOSCOPY WITH RETROGRADE PYELOGRAM/URETERAL STENT PLACEMENT (Right)  Patient Location: PACU  Anesthesia Type:General  Level of Consciousness:  sedated, patient cooperative and responds to stimulation  Airway & Oxygen Therapy:Patient Spontanous Breathing and Patient connected to face mask oxgen  Post-op Assessment:  Report given to PACU RN and Post -op Vital signs reviewed and stable  Post vital signs:  Reviewed and stable  Last Vitals:  Vitals:   11/15/18 1553  BP: (!) 137/96  Pulse: (!) 105  Resp: 18  Temp: 37.3 C  SpO2: 96%    Complications: No apparent anesthesia complications

## 2018-11-15 NOTE — Op Note (Signed)
Operative Note  Preoperative diagnosis:  1.  Right ureteral calculus  Post operative diagnosis: 1.  Right ureteral calculus  Procedure(s): 1.  Cystoscopy with right retrograde pyelogram and right ureteral stent placement  Surgeon: Modena Slater, MD  Assistants: None  Anesthesia: General  Complications: None immediate  EBL: Minimal  Specimens: 1.  None  Drains/Catheters: 1.  6 X 24 double-J ureteral stent  Intraoperative findings: 1.  Normal urethra and bladder 2.  Right retrograde pyelogram revealed a filling defect at the level of the stone with upstream hydroureteronephrosis  Indication: 38 year old female with a history of recurrent cystitis.  Also has a history of recurrent nephrolithiasis.  She presented today with a low-grade fever, tachycardia, and an obstructing right ureteral calculus.  She therefore presents for urgent stent placement.  Description of procedure:  The patient was identified and consent was obtained.  The patient was taken to the operating room and placed in the supine position.  The patient was placed under general anesthesia.  Perioperative antibiotics were administered.  The patient was placed in dorsal lithotomy.  Patient was prepped and draped in a standard sterile fashion and a timeout was performed.  A 21 French rigid cystoscope was advanced into the urethra and into the bladder.  The right distal most portion of the ureter was cannulated with an open-ended ureteral catheter.  Retrograde pyelogram was performed with the findings noted above.  A sensor wire was then advanced up to the kidney under fluoroscopic guidance.  A 6 X 24 double-J ureteral stent was advanced up to the kidney under fluoroscopic guidance.  The wire was withdrawn and fluoroscopy confirmed good proximal placement and direct visualization confirmed a good coil within the bladder.  The bladder was drained and the scope withdrawn.  This concluded the operation.  Patient tolerated  procedure well and was stable postoperatively.  Plan: Keep overnight for observation and return in 1 to 2 weeks for scheduling and repeat culture.

## 2018-11-15 NOTE — Anesthesia Preprocedure Evaluation (Addendum)
Anesthesia Evaluation  Patient identified by MRN, date of birth, ID band Patient awake    Reviewed: Allergy & Precautions, NPO status , Patient's Chart, lab work & pertinent test results  History of Anesthesia Complications Negative for: history of anesthetic complications  Airway Mallampati: II  TM Distance: >3 FB Neck ROM: Full    Dental no notable dental hx. (+) Teeth Intact   Pulmonary asthma , former smoker,    Pulmonary exam normal        Cardiovascular negative cardio ROS Normal cardiovascular exam     Neuro/Psych PSYCHIATRIC DISORDERS Anxiety Bipolar Disorder negative neurological ROS     GI/Hepatic Neg liver ROS, GERD  ,  Endo/Other  diabetes, Poorly Controlled, Type 2, Insulin Dependent  Renal/GU Renal disease (renal calculi)  negative genitourinary   Musculoskeletal negative musculoskeletal ROS (+)   Abdominal   Peds  Hematology negative hematology ROS (+)   Anesthesia Other Findings   Reproductive/Obstetrics                            Anesthesia Physical Anesthesia Plan  ASA: III  Anesthesia Plan: General   Post-op Pain Management:    Induction: Intravenous  PONV Risk Score and Plan: 3 and Ondansetron, Dexamethasone, Midazolam and Treatment may vary due to age or medical condition  Airway Management Planned: LMA  Additional Equipment: None  Intra-op Plan:   Post-operative Plan: Extubation in OR  Informed Consent: I have reviewed the patients History and Physical, chart, labs and discussed the procedure including the risks, benefits and alternatives for the proposed anesthesia with the patient or authorized representative who has indicated his/her understanding and acceptance.     Dental advisory given  Plan Discussed with:   Anesthesia Plan Comments:        Anesthesia Quick Evaluation

## 2018-11-16 ENCOUNTER — Encounter (HOSPITAL_COMMUNITY): Payer: Self-pay | Admitting: Urology

## 2018-11-16 DIAGNOSIS — N201 Calculus of ureter: Secondary | ICD-10-CM | POA: Diagnosis not present

## 2018-11-16 LAB — HIV ANTIBODY (ROUTINE TESTING W REFLEX): HIV Screen 4th Generation wRfx: NONREACTIVE

## 2018-11-16 LAB — GLUCOSE, CAPILLARY: Glucose-Capillary: 318 mg/dL — ABNORMAL HIGH (ref 70–99)

## 2018-11-16 MED ORDER — CIPROFLOXACIN HCL 500 MG PO TABS: 500.0000 mg | ORAL_TABLET | Freq: Two times a day (BID) | ORAL | 0 refills | Status: DC

## 2018-11-16 MED ORDER — OXYCODONE HCL 5 MG PO TABS: 5.0000 mg | ORAL_TABLET | ORAL | 0 refills | Status: DC | PRN

## 2018-11-16 NOTE — Progress Notes (Signed)
AVS given to patient and explained at the bedside. Medications and follow up appointments have been explained with pt verbalizing understanding.  

## 2018-11-16 NOTE — Discharge Summary (Signed)
Physician Discharge Summary  Patient ID: [name] MRN: [id number] DOB/AGE: 03-Sep-1980 38 y.o.  Admit date: 11/15/2018 Discharge date: 11/16/2018  Admission Diagnoses:  Discharge Diagnoses:  Active Problems:   Ureteral calculus   Discharged Condition: good  Hospital Course: Patient underwent urgent ureteral stent placement yesterday.  She remained overnight under observation on ciprofloxacin.  She was afebrile with vital signs stable with pain well controlled and was discharged in stable condition.  Consults: None  Significant Diagnostic Studies: None  Treatments: surgery: Right ureteral stent  Discharge Exam: Blood pressure 119/69, pulse 66, temperature 98.2 F (36.8 C), temperature source Oral, resp. rate 20, height 5' (1.524 m), weight 68 kg, last menstrual period 10/11/2018, SpO2 96 %. General appearance: alert no acute distress Adequate perfusion of extremities Nonlabored respiration Abdomen soft nontender nondistended  Disposition: Discharge disposition: 01-Home or Self Care        Allergies as of 11/16/2018      Reactions   Penicillins Anaphylaxis   Depakote Er [divalproex Sodium Er] Other (See Comments)    Hx recurrent pancreatitis   Lactose Intolerance (gi)       Medication List    TAKE these medications   albuterol 108 (90 Base) MCG/ACT inhaler Commonly known as:  VENTOLIN HFA Inhale 1-2 puffs into the lungs every 4 (four) hours as needed for wheezing or shortness of breath.   ALPRAZolam 0.25 MG tablet Commonly known as:  XANAX Take 0.25 mg by mouth as needed for anxiety. PRN as needed   asenapine 5 MG Subl 24 hr tablet Commonly known as:  SAPHRIS Place 5 mg under the tongue 2 (two) times daily. Take 5mg  in morning and 10mg  at night   azithromycin 250 MG tablet Commonly known as:  ZITHROMAX Take 1 tablet (250 mg total) by mouth daily. Take first 2 tablets together, then 1 every day until finished.   ciprofloxacin 500 MG tablet Commonly  known as:  CIPRO Take 1 tablet (500 mg total) by mouth 2 (two) times daily.   doxycycline 100 MG capsule Commonly known as:  VIBRAMYCIN Take 1 capsule (100 mg total) by mouth 2 (two) times daily.   gabapentin 600 MG tablet Commonly known as:  NEURONTIN Take 600 mg by mouth 3 (three) times daily. Takes 1200 mg at bedtime   HYDROcodone-homatropine 5-1.5 MG/5ML syrup Commonly known as:  HYCODAN Take 5 mLs by mouth every 6 (six) hours as needed for cough.   HYDROcodone-homatropine 5-1.5 MG/5ML syrup Commonly known as:  HYCODAN Take 5 mLs by mouth every 6 (six) hours as needed for cough.   ibuprofen 200 MG tablet Commonly known as:  ADVIL Take 800 mg by mouth every 6 (six) hours as needed for moderate pain.   insulin detemir 100 UNIT/ML injection Commonly known as:  LEVEMIR Inject 80 Units into the skin at bedtime.   insulin lispro 100 UNIT/ML injection Commonly known as:  HUMALOG Inject into the skin 4 (four) times daily. Sliding scale 16 units to 28units   levocetirizine 5 MG tablet Commonly known as:  XYZAL Take 10 mg by mouth daily. For allergies- daily   losartan 25 MG tablet Commonly known as:  COZAAR Take 25 mg by mouth daily.   lurasidone 40 MG Tabs tablet Commonly known as:  LATUDA Take 40 mg by mouth daily with breakfast.   oxyCODONE 5 MG immediate release tablet Commonly known as:  Oxy IR/ROXICODONE Take 1 tablet (5 mg total) by mouth every 4 (four) hours as needed for moderate pain.  oxyCODONE-acetaminophen 5-325 MG tablet Commonly known as:  Roxicet Take 1 tablet by mouth every 6 (six) hours as needed for moderate pain or severe pain. Post-operatively   phenazopyridine 200 MG tablet Commonly known as:  Pyridium Take 1 tablet (200 mg total) by mouth 3 (three) times daily as needed for pain.   QUEtiapine 100 MG tablet Commonly known as:  SEROQUEL Take 100 mg by mouth at bedtime.   ranitidine 150 MG tablet Commonly known as:  ZANTAC Take 150 mg by  mouth as needed for heartburn.   senna-docusate 8.6-50 MG tablet Commonly known as:  Senokot-S Take 1 tablet by mouth 2 (two) times daily. While taking strong pain meds to prevent constipation.   sertraline 50 MG tablet Commonly known as:  ZOLOFT Take 50 mg by mouth every morning.   sulfamethoxazole-trimethoprim 400-80 MG tablet Commonly known as:  Bactrim Take 1 tablet by mouth 2 (two) times daily. X 3 days to prevent infection with stent in place   TOPAMAX PO Take by mouth.   traZODone 150 MG tablet Commonly known as:  DESYREL Take by mouth at bedtime.   Trintellix 20 MG Tabs tablet Generic drug:  vortioxetine HBr Take 20 mg by mouth daily. Take at bedtime        Signed: Ray ChurchEugene D Bell, III 11/16/2018, 10:46 AM

## 2018-11-21 ENCOUNTER — Other Ambulatory Visit: Payer: Self-pay | Admitting: Urology

## 2018-11-21 ENCOUNTER — Other Ambulatory Visit (HOSPITAL_COMMUNITY)
Admission: RE | Admit: 2018-11-21 | Discharge: 2018-11-21 | Disposition: A | Discharge status: Home / Self Care | Payer: Medicaid Other | Source: Ambulatory Visit | Attending: Urology | Admitting: Urology

## 2018-11-21 ENCOUNTER — Encounter (HOSPITAL_BASED_OUTPATIENT_CLINIC_OR_DEPARTMENT_OTHER): Payer: Self-pay | Admitting: *Deleted

## 2018-11-21 ENCOUNTER — Other Ambulatory Visit: Payer: Self-pay

## 2018-11-21 DIAGNOSIS — Z1159 Encounter for screening for other viral diseases: Secondary | ICD-10-CM | POA: Diagnosis not present

## 2018-11-21 LAB — SARS CORONAVIRUS 2 BY RT PCR (HOSPITAL ORDER, PERFORMED IN ~~LOC~~ HOSPITAL LAB): SARS Coronavirus 2: NEGATIVE

## 2018-11-21 NOTE — Progress Notes (Signed)

## 2018-11-21 NOTE — Anesthesia Preprocedure Evaluation (Addendum)
Anesthesia Evaluation  Patient identified by MRN, date of birth, ID band Patient awake    Reviewed: Allergy & Precautions, NPO status , Patient's Chart, lab work & pertinent test results  History of Anesthesia Complications Negative for: history of anesthetic complications  Airway Mallampati: I  TM Distance: >3 FB Neck ROM: Full    Dental  (+) Missing,    Pulmonary asthma , Current Smoker, former smoker,    Pulmonary exam normal        Cardiovascular negative cardio ROS Normal cardiovascular exam     Neuro/Psych Anxiety Bipolar Disorder negative neurological ROS     GI/Hepatic Neg liver ROS, GERD  Controlled,  Endo/Other  diabetes, Poorly Controlled, Type 2  Renal/GU Renal stones     Musculoskeletal negative musculoskeletal ROS (+)   Abdominal   Peds  Hematology negative hematology ROS (+)   Anesthesia Other Findings Day of surgery medications reviewed with the patient.  Reproductive/Obstetrics                            Anesthesia Physical Anesthesia Plan  ASA: II  Anesthesia Plan: General   Post-op Pain Management:    Induction: Intravenous  PONV Risk Score and Plan: 3 and Treatment may vary due to age or medical condition, Ondansetron and Midazolam  Airway Management Planned: LMA  Additional Equipment:   Intra-op Plan:   Post-operative Plan: Extubation in OR  Informed Consent: I have reviewed the patients History and Physical, chart, labs and discussed the procedure including the risks, benefits and alternatives for the proposed anesthesia with the patient or authorized representative who has indicated his/her understanding and acceptance.     Dental advisory given  Plan Discussed with: CRNA  Anesthesia Plan Comments:        Anesthesia Quick Evaluation

## 2018-11-21 NOTE — Progress Notes (Signed)
Spoke w/ pt via phone for pre-op interview.  Npo after mn.  Arrive at 0930.  Needs istat and ekg.  Will take gabapentin, saphris, zantac, and oxycodone if needed.  Asked pt to bring rescue inhaler with her dos.  Pt had rapid covid 19 test done today.  Pt stated her fasting blood sugar runs 150 to 250, pt will not do insulin am of surgery but will do full dose night before surgery, pt verbalized understanding .

## 2018-11-22 ENCOUNTER — Ambulatory Visit (HOSPITAL_BASED_OUTPATIENT_CLINIC_OR_DEPARTMENT_OTHER): Payer: Medicaid Other | Admitting: Anesthesiology

## 2018-11-22 ENCOUNTER — Encounter (HOSPITAL_BASED_OUTPATIENT_CLINIC_OR_DEPARTMENT_OTHER)
Admission: RE | Disposition: A | Discharge status: Home / Self Care | Payer: Self-pay | Source: Home / Self Care | Attending: Urology

## 2018-11-22 ENCOUNTER — Other Ambulatory Visit (HOSPITAL_COMMUNITY): Payer: Medicaid Other

## 2018-11-22 ENCOUNTER — Ambulatory Visit (HOSPITAL_BASED_OUTPATIENT_CLINIC_OR_DEPARTMENT_OTHER)
Admission: RE | Admit: 2018-11-22 | Discharge: 2018-11-22 | Disposition: A | Discharge status: Home / Self Care | Payer: Medicaid Other | Attending: Urology | Admitting: Urology

## 2018-11-22 ENCOUNTER — Encounter (HOSPITAL_BASED_OUTPATIENT_CLINIC_OR_DEPARTMENT_OTHER): Payer: Self-pay

## 2018-11-22 ENCOUNTER — Other Ambulatory Visit: Payer: Self-pay

## 2018-11-22 DIAGNOSIS — F40240 Claustrophobia: Secondary | ICD-10-CM | POA: Insufficient documentation

## 2018-11-22 DIAGNOSIS — Z794 Long term (current) use of insulin: Secondary | ICD-10-CM | POA: Insufficient documentation

## 2018-11-22 DIAGNOSIS — K219 Gastro-esophageal reflux disease without esophagitis: Secondary | ICD-10-CM | POA: Diagnosis not present

## 2018-11-22 DIAGNOSIS — Z87442 Personal history of urinary calculi: Secondary | ICD-10-CM | POA: Insufficient documentation

## 2018-11-22 DIAGNOSIS — N202 Calculus of kidney with calculus of ureter: Secondary | ICD-10-CM | POA: Diagnosis not present

## 2018-11-22 DIAGNOSIS — Z87440 Personal history of urinary (tract) infections: Secondary | ICD-10-CM | POA: Diagnosis not present

## 2018-11-22 DIAGNOSIS — N200 Calculus of kidney: Secondary | ICD-10-CM | POA: Diagnosis present

## 2018-11-22 DIAGNOSIS — Z9119 Patient's noncompliance with other medical treatment and regimen: Secondary | ICD-10-CM | POA: Insufficient documentation

## 2018-11-22 DIAGNOSIS — F319 Bipolar disorder, unspecified: Secondary | ICD-10-CM | POA: Diagnosis not present

## 2018-11-22 DIAGNOSIS — Z9049 Acquired absence of other specified parts of digestive tract: Secondary | ICD-10-CM | POA: Insufficient documentation

## 2018-11-22 DIAGNOSIS — Z888 Allergy status to other drugs, medicaments and biological substances status: Secondary | ICD-10-CM | POA: Insufficient documentation

## 2018-11-22 DIAGNOSIS — Z880 Allergy status to penicillin: Secondary | ICD-10-CM | POA: Diagnosis not present

## 2018-11-22 DIAGNOSIS — F17210 Nicotine dependence, cigarettes, uncomplicated: Secondary | ICD-10-CM | POA: Diagnosis not present

## 2018-11-22 DIAGNOSIS — J45990 Exercise induced bronchospasm: Secondary | ICD-10-CM | POA: Diagnosis not present

## 2018-11-22 DIAGNOSIS — F419 Anxiety disorder, unspecified: Secondary | ICD-10-CM | POA: Insufficient documentation

## 2018-11-22 DIAGNOSIS — E739 Lactose intolerance, unspecified: Secondary | ICD-10-CM | POA: Diagnosis not present

## 2018-11-22 DIAGNOSIS — E1140 Type 2 diabetes mellitus with diabetic neuropathy, unspecified: Secondary | ICD-10-CM | POA: Insufficient documentation

## 2018-11-22 HISTORY — PX: CYSTOSCOPY WITH RETROGRADE PYELOGRAM, URETEROSCOPY AND STENT PLACEMENT: SHX5789

## 2018-11-22 HISTORY — PX: HOLMIUM LASER APPLICATION: SHX5852

## 2018-11-22 HISTORY — DX: Polyneuropathy, unspecified: G62.9

## 2018-11-22 HISTORY — DX: Cyst of kidney, acquired: N28.1

## 2018-11-22 HISTORY — DX: Presence of spectacles and contact lenses: Z97.3

## 2018-11-22 HISTORY — DX: Calculus of kidney: N20.0

## 2018-11-22 LAB — POCT I-STAT, CHEM 8
BUN: 13 mg/dL (ref 6–20)
Calcium, Ion: 1.25 mmol/L (ref 1.15–1.40)
Chloride: 104 mmol/L (ref 98–111)
Creatinine, Ser: 0.6 mg/dL (ref 0.44–1.00)
Glucose, Bld: 312 mg/dL — ABNORMAL HIGH (ref 70–99)
HCT: 41 % (ref 36.0–46.0)
Hemoglobin: 13.9 g/dL (ref 12.0–15.0)
Potassium: 4.1 mmol/L (ref 3.5–5.1)
Sodium: 135 mmol/L (ref 135–145)
TCO2: 22 mmol/L (ref 22–32)

## 2018-11-22 LAB — GLUCOSE, CAPILLARY
Glucose-Capillary: 252 mg/dL — ABNORMAL HIGH (ref 70–99)
Glucose-Capillary: 348 mg/dL — ABNORMAL HIGH (ref 70–99)

## 2018-11-22 SURGERY — CYSTOURETEROSCOPY, WITH RETROGRADE PYELOGRAM AND STENT INSERTION
Anesthesia: General | Laterality: Bilateral

## 2018-11-22 MED ORDER — OXYCODONE HCL 5 MG PO TABS
ORAL_TABLET | ORAL | Status: AC
  Filled 2018-11-22: qty 1

## 2018-11-22 MED ORDER — GENTAMICIN SULFATE 40 MG/ML IJ SOLN
5.0000 mg/kg | INTRAVENOUS | Status: AC
  Administered 2018-11-22: 11:00:00 340 mg via INTRAVENOUS
  Filled 2018-11-22 (×2): qty 8.5

## 2018-11-22 MED ORDER — LACTATED RINGERS IV SOLN
INTRAVENOUS | Status: DC
  Administered 2018-11-22: 50 mL/h via INTRAVENOUS
  Filled 2018-11-22: qty 1000

## 2018-11-22 MED ORDER — MIDAZOLAM HCL 2 MG/2ML IJ SOLN
INTRAMUSCULAR | Status: AC
  Filled 2018-11-22: qty 2

## 2018-11-22 MED ORDER — OXYCODONE HCL 5 MG PO TABS
5.0000 mg | ORAL_TABLET | Freq: Once | ORAL | Status: AC | PRN
  Administered 2018-11-22: 5 mg via ORAL
  Filled 2018-11-22: qty 1

## 2018-11-22 MED ORDER — FENTANYL CITRATE (PF) 100 MCG/2ML IJ SOLN
25.0000 ug | INTRAMUSCULAR | Status: DC | PRN
  Filled 2018-11-22: qty 1

## 2018-11-22 MED ORDER — INSULIN ASPART 100 UNIT/ML ~~LOC~~ SOLN
6.0000 [IU] | Freq: Once | SUBCUTANEOUS | Status: AC
  Administered 2018-11-22: 10:00:00 6 [IU] via SUBCUTANEOUS
  Filled 2018-11-22: qty 0.06

## 2018-11-22 MED ORDER — PROMETHAZINE HCL 25 MG/ML IJ SOLN
6.2500 mg | INTRAMUSCULAR | Status: DC | PRN
  Filled 2018-11-22: qty 1

## 2018-11-22 MED ORDER — DEXAMETHASONE SODIUM PHOSPHATE 4 MG/ML IJ SOLN
INTRAMUSCULAR | Status: DC | PRN
  Administered 2018-11-22: 4 mg via INTRAVENOUS

## 2018-11-22 MED ORDER — MIDAZOLAM HCL 5 MG/5ML IJ SOLN
INTRAMUSCULAR | Status: DC | PRN
  Administered 2018-11-22: 2 mg via INTRAVENOUS

## 2018-11-22 MED ORDER — FENTANYL CITRATE (PF) 100 MCG/2ML IJ SOLN
INTRAMUSCULAR | Status: AC
  Filled 2018-11-22: qty 4

## 2018-11-22 MED ORDER — KETOROLAC TROMETHAMINE 30 MG/ML IJ SOLN
INTRAMUSCULAR | Status: DC | PRN
  Administered 2018-11-22: 30 mg via INTRAVENOUS

## 2018-11-22 MED ORDER — ACETAMINOPHEN 10 MG/ML IV SOLN
1000.0000 mg | Freq: Once | INTRAVENOUS | Status: DC | PRN
  Filled 2018-11-22: qty 100

## 2018-11-22 MED ORDER — FENTANYL CITRATE (PF) 100 MCG/2ML IJ SOLN
INTRAMUSCULAR | Status: DC | PRN
  Administered 2018-11-22: 25 ug via INTRAVENOUS
  Administered 2018-11-22: 50 ug via INTRAVENOUS
  Administered 2018-11-22: 25 ug via INTRAVENOUS
  Administered 2018-11-22: 50 ug via INTRAVENOUS
  Administered 2018-11-22 (×2): 25 ug via INTRAVENOUS

## 2018-11-22 MED ORDER — ONDANSETRON HCL 4 MG/2ML IJ SOLN
INTRAMUSCULAR | Status: DC | PRN
  Administered 2018-11-22: 4 mg via INTRAVENOUS

## 2018-11-22 MED ORDER — OXYCODONE HCL 5 MG/5ML PO SOLN
5.0000 mg | Freq: Once | ORAL | Status: AC | PRN
  Filled 2018-11-22: qty 5

## 2018-11-22 MED ORDER — PROPOFOL 10 MG/ML IV BOLUS
INTRAVENOUS | Status: DC | PRN
  Administered 2018-11-22: 150 mg via INTRAVENOUS

## 2018-11-22 MED ORDER — LIDOCAINE HCL (CARDIAC) PF 100 MG/5ML IV SOSY
PREFILLED_SYRINGE | INTRAVENOUS | Status: DC | PRN
  Administered 2018-11-22: 60 mg via INTRAVENOUS

## 2018-11-22 MED ORDER — INSULIN ASPART 100 UNIT/ML ~~LOC~~ SOLN
SUBCUTANEOUS | Status: AC
  Filled 2018-11-22: qty 1

## 2018-11-22 MED ORDER — IOHEXOL 300 MG/ML  SOLN
INTRAMUSCULAR | Status: DC | PRN
  Administered 2018-11-22: 11:00:00 10 mL via URETHRAL

## 2018-11-22 SURGICAL SUPPLY — 24 items
BAG DRAIN URO-CYSTO SKYTR STRL (DRAIN) ×3 IMPLANT
BASKET LASER NITINOL 1.9FR (BASKET) ×3 IMPLANT
CATH INTERMIT  6FR 70CM (CATHETERS) IMPLANT
CLOTH BEACON ORANGE TIMEOUT ST (SAFETY) ×3 IMPLANT
FIBER LASER FLEXIVA 365 (UROLOGICAL SUPPLIES) IMPLANT
FIBER LASER TRAC TIP (UROLOGICAL SUPPLIES) IMPLANT
GLOVE BIO SURGEON STRL SZ7.5 (GLOVE) ×3 IMPLANT
GOWN STRL REUS W/TWL LRG LVL3 (GOWN DISPOSABLE) ×3 IMPLANT
GUIDEWIRE ANG ZIPWIRE 038X150 (WIRE) ×6 IMPLANT
GUIDEWIRE STR DUAL SENSOR (WIRE) ×6 IMPLANT
IV NS 1000ML (IV SOLUTION) ×2
IV NS 1000ML BAXH (IV SOLUTION) ×1 IMPLANT
IV NS IRRIG 3000ML ARTHROMATIC (IV SOLUTION) ×3 IMPLANT
KIT TURNOVER CYSTO (KITS) ×3 IMPLANT
MANIFOLD NEPTUNE II (INSTRUMENTS) ×3 IMPLANT
NS IRRIG 500ML POUR BTL (IV SOLUTION) ×6 IMPLANT
PACK CYSTO (CUSTOM PROCEDURE TRAY) ×3 IMPLANT
SHEATH ACCESS URETERAL 24CM (SHEATH) ×3 IMPLANT
STENT POLARIS 5FRX22 (STENTS) ×6 IMPLANT
SYR 10ML LL (SYRINGE) ×3 IMPLANT
TUBE CONNECTING 12'X1/4 (SUCTIONS)
TUBE CONNECTING 12X1/4 (SUCTIONS) IMPLANT
TUBE FEEDING 8FR 16IN STR KANG (MISCELLANEOUS) IMPLANT
TUBING UROLOGY SET (TUBING) IMPLANT

## 2018-11-22 NOTE — Brief Op Note (Signed)
11/22/2018  11:19 AM  PATIENT:  [name]  38 y.o. female  PRE-OPERATIVE DIAGNOSIS:  RIGHT GREATER THAN LEFT KIDNEY STONES  POST-OPERATIVE DIAGNOSIS:  RIGHT GREATER THAN LEFT KIDNEY STONES  PROCEDURE:  Procedure(s): CYSTOSCOPY WITH RETROGRADE PYELOGRAM, URETEROSCOPY AND STENT PLACEMENT (Bilateral) HOLMIUM LASER APPLICATION (Bilateral)  SURGEON:  Surgeon(s) and Role:    * Sebastian Ache, MD - Primary  PHYSICIAN ASSISTANT:   ASSISTANTS: none   ANESTHESIA:   general  EBL:  0 mL   BLOOD ADMINISTERED: none  DRAINS: none   LOCAL MEDICATIONS USED:  NONE  SPECIMEN:  Source of Specimen:  bilateral renal / ureteral stone fragments   DISPOSITION OF SPECIMEN:  given to patient  COUNTS:  YES  TOURNIQUET:  * No tourniquets in log *  DICTATION: .Other Dictation: Dictation Number  [id number]  PLAN OF CARE: Discharge to home after PACU  PATIENT DISPOSITION:  PACU - hemodynamically stable.   Delay start of Pharmacological VTE agent (>24hrs) due to surgical blood loss or risk of bleeding: not applicable

## 2018-11-22 NOTE — Progress Notes (Signed)
Pt states that the 348 blood sugar is normal for her since she has not taken her next dose of insulin and states that she has it in the car.  Informed Dr. Chancy Milroy of [facility] and that she has insulin in the car and states that she will take that as soon as she gets to the car.  Okay to D/C if she is going to take her insulin.

## 2018-11-22 NOTE — Transfer of Care (Signed)
Immediate Anesthesia Transfer of Care Note  Patient: [name]  Procedure(s) Performed: Procedure(s) (LRB): CYSTOSCOPY WITH RETROGRADE PYELOGRAM, URETEROSCOPY AND STENT PLACEMENT (Bilateral) HOLMIUM LASER APPLICATION (Bilateral)  Patient Location: PACU  Anesthesia Type: General  Level of Consciousness: awake, sedated, patient cooperative and responds to stimulation  Airway & Oxygen Therapy: Patient Spontanous Breathing and Patient connected to [place] O2 w/ soft face mask   Post-op Assessment: Report given to PACU RN, Post -op Vital signs reviewed and stable and Patient moving all extremities  Post vital signs: Reviewed and stable  Complications: No apparent anesthesia complications

## 2018-11-22 NOTE — H&P (Signed)
[name] is an 38 y.o. female.    Chief Complaint: Pre-OP BILATERAL Ureteroscopic Stone Manipulation  HPI:   1 - Recurrent cystitis - few episodes per year simple cystits. She diabetic on insulin and oral agents and admittedly "sugars crazy sometimes", massive glycosuria off the chart on UA x many and CBGs 300+ x several. Pelvic 08/2015 normal. PVR normal x several. No post-coital pattern. She has intermitant stones.   UCX 11/2018 enterococcus sens Gent, Levaquin, Doxy   2 - Recurrent Nephrolithiasis -   Recent Course:  Pre 2017 - MET x several  05/2016 - Rt URS 36m UPJ stone  2018 - CT - stone free  11/2018 - CT Rt 432mprox ureteral stone, and bilateral punctate renal stones ==> Rt 6x24 stent by Bell on call   PMH sig for Bipolar, IDDM2 with LE neuropathy (follows [name] lap chole. Her PCP is [name].   Today " [name] seen to proceed with BILATERAL ureteroscopic stone manipulation with goal of stone free. No interval fevers. She is on Levaquin as per recent CX data.    Past Medical History:  Diagnosis Date  . Bilateral flank pain   . Bilateral renal stones    right > left  . [place] 1 disorder [place]  . Claustrophobia   . Complication of anesthesia    claustrophobic- jittery   . Exercise-induced asthma   . Frequency of urination   . GERD (gastroesophageal reflux disease)    watches diet  . History of acute pancreatitis    04-19-2013;  12/ 2012;  09/ 2011  . History of kidney stones    since age 38. History of recurrent UTIs   . Insulin dependent type [facility]   [name] A1c 9.9 on 11-15-2018  . Peripheral neuropathy   . Renal cyst, right urologist--- dr maTresa Moore non-complex  . Urgency of urination   . Wears glasses     Past Surgical History:  Procedure Laterality Date  . CYSTOSCOPY W/ URETERAL STENT PLACEMENT Right 11/15/2018   Procedure: CYSTOSCOPY WITH  RETROGRADE PYELOGRAM/URETERAL STENT PLACEMENT;  Surgeon: BeLucas MallowMD;  Location: [facility];  Service: Urology;  Laterality: Right;  . CYSTOSCOPY WITH RETROGRADE PYELOGRAM, URETEROSCOPY AND STENT PLACEMENT Bilateral 05/31/2016   Procedure: CYSTOSCOPY WITH BILATERAL RETROGRADE PYELOGRAM, RIGHT URETEROSCOPY, RIGHT STENT PLACEMENT, RIGHT LASER LITHOTRIPSY;  Surgeon: ThAlexis FrockMD;  [facility];  Laterality: Bilateral;  . ERCP W/ SPHICTEROTOMY  01/28/2010  . EUS  08/02/2011   Procedure: UPPER ENDOSCOPIC ULTRASOUND (EUS) RADIAL;  Surgeon: WiLandry DykeMD;  Location: [facility];  Service: Endoscopy;  Laterality: N/A;  . FINE NEEDLE ASPIRATION  08/02/2011   Procedure: FINE NEEDLE ASPIRATION (FNA) RADIAL;  Surgeon: WiLandry DykeMD;  Location: [facility];  Service: Endoscopy;  Laterality: N/A;  . KNEE ARTHROSCOPY Left 06-17-2018   '@[facility]'   . LAPAROSCOPIC CHOLECYSTECTOMY  07/25/2010    History reviewed. No pertinent family history. Social History:  reports that she has been smoking cigarettes. She has a 16.00 pack-year smoking history. She has never used smokeless tobacco. She reports previous alcohol use. She reports that she does not use drugs.  Allergies:  Allergies  Allergen Reactions  . Penicillins Anaphylaxis  . Depakote Er [Divalproex Sodium Er] Other (See Comments)     Hx recurrent pancreatitis  . Lactose Intolerance (  Gi)     No medications prior to admission.    Results for orders placed or performed during the hospital encounter of 11/21/18 (from the past 48 hour(s))  SARS Coronavirus 2 (CEPHEID - Performed in [facility] hospital lab), Hosp Order     Status: None   Collection Time: 11/21/18 12:38 PM  Result Value Ref Range   SARS Coronavirus 2 NEGATIVE NEGATIVE    Comment: (NOTE) If result is NEGATIVE SARS-CoV-2 target nucleic acids are NOT DETECTED. The SARS-CoV-2 RNA is generally detectable in upper and lower   respiratory specimens during the acute phase of infection. The lowest  concentration of SARS-CoV-2 viral copies this assay can detect is 250  copies / mL. A negative result does not preclude SARS-CoV-2 infection  and should not be used as the sole basis for treatment or other  patient management decisions.  A negative result may occur with  improper specimen collection / handling, submission of specimen other  than nasopharyngeal swab, presence of viral mutation(s) within the  areas targeted by this assay, and inadequate number of viral copies  (<250 copies / mL). A negative result must be combined with clinical  observations, patient history, and epidemiological information. If result is POSITIVE SARS-CoV-2 target nucleic acids are DETECTED. The SARS-CoV-2 RNA is generally detectable in upper and lower  respiratory specimens dur ing the acute phase of infection.  Positive  results are indicative of active infection with SARS-CoV-2.  Clinical  correlation with patient history and other diagnostic information is  necessary to determine patient infection status.  Positive results do  not rule out bacterial infection or co-infection with other viruses. If result is PRESUMPTIVE POSTIVE SARS-CoV-2 nucleic acids MAY BE PRESENT.   A presumptive positive result was obtained on the submitted specimen  and confirmed on repeat testing.  While 2019 novel coronavirus  (SARS-CoV-2) nucleic acids may be present in the submitted sample  additional confirmatory testing may be necessary for epidemiological  and / or clinical management purposes  to differentiate between  SARS-CoV-2 and other Sarbecovirus currently known to infect humans.  If clinically indicated additional testing with an alternate test  methodology [phone]) is advised. The SARS-CoV-2 RNA is generally  detectable in upper and lower respiratory sp ecimens during the acute  phase of infection. The expected result is Negative. Fact  Sheet for Patients:  StrictlyIdeas.no Fact Sheet for Healthcare Providers: BankingDealers.co.za This test is not yet approved or cleared by the [place] FDA and has been authorized for detection and/or diagnosis of SARS-CoV-2 by FDA under an Emergency Use Authorization (EUA).  This EUA will remain in effect (meaning this test can be used) for the duration of the COVID-19 declaration under Section 564(b)(1) of the Act, 21 U.S.C. section 360bbb-3(b)(1), unless the authorization is terminated or revoked sooner. Performed at [facility], [place] [address] [place], [place] [address]    No results found.  Review of Systems  Constitutional: Negative.  Negative for chills and fever.  HENT: Negative.   Eyes: Negative.   Respiratory: Negative.   Cardiovascular: Negative.   Gastrointestinal: Negative.   Genitourinary: Positive for frequency and urgency.  Musculoskeletal: Negative.   Skin: Negative.   Neurological: Negative.   Endo/Heme/Allergies: Negative.   Psychiatric/Behavioral: Negative.     Height 5' (1.524 m), weight 72.6 kg. Physical Exam  Constitutional: She appears well-developed.  HENT:  Head: Normocephalic.  Eyes: Pupils are equal, round, and reactive to light.  Neck: Normal range of motion.  Cardiovascular:  Normal rate.  Respiratory: Effort normal.  GI: Soft.  Genitourinary:    Genitourinary Comments: No CVAT at present.    Musculoskeletal: Normal range of motion.  Neurological: She is alert.  Psychiatric: She has a normal mood and affect.     Assessment/Plan  Proceed as planned with BILATERAL ureteroscopic stone manipulation. Risks, benefits, alternatives, expected peri-op course discussed previously and reiterated today.   Alexis Frock, Ortiz 11/22/2018, 8:31 AM

## 2018-11-22 NOTE — Op Note (Signed)
NAME: Anne KohutGOSSETT, [name] MEDICAL RECORD [id number] ACCOUNT [id number] DATE OF BIRTH:[birth year] FAC[facility] LOCATION: [facility] PHYSICIAN:Karston Hyland Berneice HeinrichMANNY, MD  OPERATIVE REPORT  DATE OF PROCEDURE:  11/22/2018  PREOPERATIVE DIAGNOSIS:   1.  Right ureteral and bilateral renal stones 2.  History of recurrent urolithiasis. 3.  Poorly compliant diabetes.  PROCEDURE: 1.  Cystoscopy with right ureteral stent exchange, left ureteral stent placement, bilateral ureteroscopy with basketing of stones.  ESTIMATED BLOOD LOSS:  Nil.  COMPLICATIONS:  None.  SPECIMENS:  Bilateral renal and right ureteral stone fragments were given to patient.  FINDINGS:   1.  Proximal right ureteral stone, multifocal bilateral small papillary tip calcifications. 2.  Complete resolution of all accessible stone fragments larger than one-third millimeter following basket extraction.   3.  Distal placement of bilateral ureteral stents, distal end in urinary bladder.  INDICATIONS:  The patient is a pleasant but unfortunate 38 year old lady with history of significant  diabetes for which she has been variably compliant.  She also has a history of recurrent infections from this and the recurrent urolithiasis.  She has  undergone several prior stone surgeries and is in agreement with plan for medical therapy for this.  She developed another right ureteral stone recently with concomitant infection.  This was managed with stenting at that time.  She has been on culture  specific antibiotics and has cleared her infectious parameters.  She also was noted to have bilateral papillary tip calcifications.  Options were discussed for management including recommended path of bilateral ureteroscopy with goal of stone free.   Given his recurrence pattern, she wished to proceed.  Informed consent was then placed in medical record.  She has been on culture specific antibiotics before today.  DESCRIPTION OF PROCEDURE:  Until patient  being [name] was confirmed.  Surgical timeout was performed.  Intravenous antibiotics administered.  General anesthesia induced.  The patient was placed  into a low lithotomy position, sterile field was created.  Prepped and draped the patient's vagina, introitus and proximal thighs using iodine.  Cystourethroscopy was performed with a 21-French rigid cystoscope with offset lens.  Inspection of bladder  revealed no diverticula, calcifications, papillary lesions.  The distal end of right ureteral stent was seen in situ.  It was grasped, brought to the level of the urethral meatus.  A 0.3 ZIPwire was placed in the lower pole and set aside as a safety  wire.  At this point, the fluoroscopy machine became inoperable.  It was rebooted and did allow for limited fluoroscopy throughout the remainder of the case.  Next, semirigid ureteroscopy was performed of the entire length of the right ureter alongside a  separate sensor working wire.  There was a proximal ureteral stone.  This was a fusiform, free floating.  It was retrograde positioned to the level of the kidney.  Next, ureteroscopy performed the entire length of the left ureter after a safety wire was  placed alongside a separate sensor working wire.  No additional abnormalities or calcifications were seen in the entire length of the left ureter.  Next, the 12/14 short length ureteral access sheath was placed over the right sensor working wire to the  level of the proximal ureter and flexible digital ureteroscopy performed of the proximal ureter with inspection of the right kidney.  The previously re positioned ureteral stone was seen, it was quite fusiform.  It did appear amenable to basketing.  It  was grasped on its long axis and  escape basket removed and set aside.  There was another papillary tip calcification in the upper pole, approximately 3 mm, also fusiform.  It was grasped and removed.  This  resulted in removal of all accessible stone  fragments larger than one-third millimeter on the right side.  The access sheath was removed under continuous vision, no additional mucosal abnormalities were found.  The procedure was then placed over the left-sided sensor working wire placed in the  proximal left ureter and systematic inspection was performed of the proximal left ureter and left kidney.  There were 2 foci of papillary tip calcifications.  These were amenable to basketing alone.  They were grasped in the escape basket, removed and  set aside to the patient.  The access sheath was then removed.  No additional calcifications or mucosal abnormalities were found.  She had resolution of all accessible stone fragments larger than one-third millimeters on the left side.  Next, given the  bilateral nature of the procedure, it was felt that brief interval stenting with tethered stents was warranted and a new 5 x 22 Polaris-type stent was placed with remaining safety wires bilaterally using cystoscopic guidance.  Good distal plane were  noted.  Tether was left in place and fashioned to the mons pubis and the procedure terminated.   The patient tolerated the procedure well.  No immediate complications.  The patient was taken to Post Anesthesia Care Unit in stable condition.  AN/NUANCE  D:11/22/2018 T:11/22/2018 JOB:[id number]

## 2018-11-22 NOTE — Discharge Instructions (Signed)
1 - You may have urinary urgency (bladder spasms) and bloody urine on / off with stent in place. This is normal.  2 - Remove tethered stents on Monday morning at home by pulling strings, then blue-white plastic tubing and discarding.   3 -Call MD or go to ER for fever >102, severe pain / nausea / vomiting not relieved by medications, or acute change in medical status   CYSTOSCOPY HOME CARE INSTRUCTIONS  Activity: Rest for the remainder of the day.  Do not drive or operate equipment today.  You may resume normal activities in one to two days as instructed by your physician.   Meals: Drink plenty of liquids and eat light foods such as gelatin or soup this evening.  You may return to a normal meal plan tomorrow.  Return to Work: You may return to work in one to two days or as instructed by your physician.  Special Instructions / Symptoms: Call your physician if any of these symptoms occur:   -persistent or heavy bleeding  -bleeding which continues after first few urination  -large blood clots that are difficult to pass  -urine stream diminishes or stops completely  -fever equal to or higher than 101 degrees Farenheit.  -cloudy urine with a strong, foul odor  -severe pain  Females should always wipe from front to back after elimination.  You may feel some burning pain when you urinate.  This should disappear with time.  Applying moist heat to the lower abdomen or a hot tub bath may help relieve the pain. \  Follow-Up / Date of Return Visit to Your Physician:   Call for an appointment to arrange follow-up.  Patient Signature:  ________________________________________________________  Nurse's Signature:  ________________________________________________________     Post Anesthesia Home Care Instructions  Activity: Get plenty of rest for the remainder of the day. A responsible individual must stay with you for 24 hours following the procedure.  For the next 24 hours, DO  NOT: -Drive a car -Advertising copywriterperate machinery -Drink alcoholic beverages -Take any medication unless instructed by your physician -Make any legal decisions or sign important papers.  Meals: Start with liquid foods such as gelatin or soup. Progress to regular foods as tolerated. Avoid greasy, spicy, heavy foods. If nausea and/or vomiting occur, drink only clear liquids until the nausea and/or vomiting subsides. Call your physician if vomiting continues.  Special Instructions/Symptoms: Your throat may feel dry or sore from the anesthesia or the breathing tube placed in your throat during surgery. If this causes discomfort, gargle with warm salt water. The discomfort should disappear within 24 hours.  If you had a scopolamine patch placed behind your ear for the management of post- operative nausea and/or vomiting:  1. The medication in the patch is effective for 72 hours, after which it should be removed.  Wrap patch in a tissue and discard in the trash. Wash hands thoroughly with soap and water. 2. You may remove the patch earlier than 72 hours if you experience unpleasant side effects which may include dry mouth, dizziness or visual disturbances. 3. Avoid touching the patch. Wash your hands with soap and water after contact with the patch.     [facility] [phone] Post Ureteroscopy With or Without Stent Instructions  Definitions:  Ureter: The duct that transports urine from the kidney to the bladder. Stent:   A plastic hollow tube that is placed into the ureter, from the kidney to the  bladder to prevent the ureter from swelling shut.  GENERAL INSTRUCTIONS:  Despite the fact that no skin incisions were used, the area around the ureter and bladder is raw and irritated. The stent is a foreign body which will further irritate the bladder wall. This irritation is manifested by increased frequency of urination, both day and night, and by an increase in the urge to  urinate. In some, the urge to urinate is present almost always. Sometimes the urge is strong enough that you may not be able to stop yourself from urinating. The only real cure is to remove the stent and then give time for the bladder wall to heal which can't be done until the danger of the ureter swelling shut has passed, which varies.  You may see some blood in your urine while the stent is in place and a few days afterwards. Do not be alarmed, even if the urine was clear for a while. Get off your feet and drink lots of fluids until clearing occurs. If you start to pass clots or don't improve, call us.  DIET: You may return to your normal diet immediately. Because of the raw surface of your bladder, alcohol, spicy foods, acid type foods and drinks with caffeine may cause irritation or frequency and should be used in moderation. To keep your urine flowing freely and to avoid constipation, drink plenty of fluids during the day ( 8-10 glasses ). Tip: Avoid cranberry juice because it is very acidic.  ACTIVITY: Your physical activity doesn't need to be restricted. However, if you are very active, you may see some blood in your urine. We suggest that you reduce your activity under these circumstances until the bleeding has stopped.  BOWELS: It is important to keep your bowels regular during the postoperative period. Straining with bowel movements can cause bleeding. A bowel movement every other day is reasonable. Use a mild laxative if needed, such as Milk of Magnesia 2-3 tablespoons, or 2 Dulcolax tablets. Call if you continue to have problems. If you have been taking narcotics for pain, before, during or after your surgery, you may be constipated. Take a laxative if necessary.   MEDICATION: You should resume your pre-surgery medications unless told not to. In addition you will often be given an antibiotic to prevent infection. These should be taken as prescribed until the bottles are finished unless  you are having an unusual reaction to one of the drugs.  PROBLEMS YOU SHOULD REPORT TO [place]:  Fevers over 100.5 Fahrenheit.  Heavy bleeding, or clots ( See above notes about blood in urine ).  Inability to urinate.  Drug reactions ( hives, rash, nausea, vomiting, diarrhea ).  Severe burning or pain with urination that is not improving.  FOLLOW-UP: You will need a follow-up appointment to monitor your progress. Call for this appointment at the number listed above. Usually the first appointment will be about three to fourteen days after your surgery.

## 2018-11-22 NOTE — Anesthesia Procedure Notes (Addendum)
Procedure Name: LMA Insertion Date/Time: 11/22/2018 10:35 AM Performed by: Jessica Priest, CRNA Pre-anesthesia Checklist: Patient identified, Emergency Drugs available, Suction available and Patient being monitored Patient Re-evaluated:Patient Re-evaluated prior to induction Oxygen Delivery Method: Circle system utilized Preoxygenation: Pre-oxygenation with 100% oxygen Induction Type: IV induction Ventilation: Mask ventilation without difficulty LMA: LMA inserted LMA Size: 4.0 Number of attempts: 1 Airway Equipment and Method: Bite block Placement Confirmation: positive ETCO2 and breath sounds checked- equal and bilateral Tube secured with: Tape Dental Injury: Teeth and Oropharynx as per pre-operative assessment  Comments: Plastic Dermal Studs bilaterally lower lips intact w/ LMA placement

## 2018-11-26 ENCOUNTER — Encounter (HOSPITAL_BASED_OUTPATIENT_CLINIC_OR_DEPARTMENT_OTHER): Payer: Self-pay | Admitting: Urology

## 2018-11-26 NOTE — Anesthesia Postprocedure Evaluation (Signed)
Anesthesia Post Note  Patient: [name]  Procedure(s) Performed: CYSTOSCOPY WITH RETROGRADE PYELOGRAM, URETEROSCOPY AND STENT PLACEMENT (Bilateral ) HOLMIUM LASER APPLICATION (Bilateral )     Anesthesia Post Evaluation  Last Vitals:  Vitals:   11/22/18 1300 11/22/18 1345  BP: 98/73 97/67  Pulse: 78 84  Resp: 17 16  Temp:  36.9 C  SpO2: 96% 96%    Last Pain:  Vitals:   11/22/18 1345  TempSrc: Oral  PainSc: 2    Pain Goal: Patients Stated Pain Goal: 7 (11/22/18 1345)                 Kaylyn Layer

## 2019-01-02 ENCOUNTER — Other Ambulatory Visit: Payer: Self-pay | Admitting: *Deleted

## 2019-01-02 ENCOUNTER — Other Ambulatory Visit: Payer: Self-pay

## 2019-01-02 ENCOUNTER — Encounter: Payer: Self-pay | Admitting: Allergy and Immunology

## 2019-01-02 ENCOUNTER — Ambulatory Visit (INDEPENDENT_AMBULATORY_CARE_PROVIDER_SITE_OTHER): Payer: Medicaid Other | Admitting: Allergy and Immunology

## 2019-01-02 VITALS — BP 122/78 | HR 100 | Temp 97.7°F | Resp 18 | Ht 60.8 in | Wt 155.0 lb

## 2019-01-02 DIAGNOSIS — Z720 Tobacco use: Secondary | ICD-10-CM | POA: Insufficient documentation

## 2019-01-02 DIAGNOSIS — H1010 Acute atopic conjunctivitis, unspecified eye: Secondary | ICD-10-CM | POA: Insufficient documentation

## 2019-01-02 DIAGNOSIS — J4540 Moderate persistent asthma, uncomplicated: Secondary | ICD-10-CM | POA: Diagnosis not present

## 2019-01-02 DIAGNOSIS — J3089 Other allergic rhinitis: Secondary | ICD-10-CM | POA: Diagnosis not present

## 2019-01-02 DIAGNOSIS — H1013 Acute atopic conjunctivitis, bilateral: Secondary | ICD-10-CM

## 2019-01-02 DIAGNOSIS — B999 Unspecified infectious disease: Secondary | ICD-10-CM

## 2019-01-02 MED ORDER — AZELASTINE HCL 0.15 % NA SOLN: 1.0000 | Freq: Two times a day (BID) | NASAL | 5 refills | Status: DC | PRN

## 2019-01-02 MED ORDER — AZELASTINE HCL 0.1 % NA SOLN: 1.0000 | Freq: Two times a day (BID) | NASAL | 5 refills | Status: DC | PRN

## 2019-01-02 MED ORDER — PAZEO 0.7 % OP SOLN: 1.0000 [drp] | OPHTHALMIC | 5 refills | Status: DC

## 2019-01-02 MED ORDER — CARBINOXAMINE MALEATE 4 MG PO TABS: 4.0000 mg | ORAL_TABLET | Freq: Three times a day (TID) | ORAL | 3 refills | Status: DC | PRN

## 2019-01-02 MED ORDER — SPIRIVA RESPIMAT 1.25 MCG/ACT IN AERS: 2.0000 | INHALATION_SPRAY | Freq: Every day | RESPIRATORY_TRACT | 5 refills | Status: DC

## 2019-01-02 NOTE — Assessment & Plan Note (Signed)
   Treatment plan as outlined above for allergic rhinitis.  A prescription has been provided for Pazeo, one drop per eye daily as needed.  I have also recommended eye lubricant drops (i.e., Natural Tears) as needed. 

## 2019-01-02 NOTE — Assessment & Plan Note (Addendum)
   The risks and benefits of aeroallergen immunotherapy have been discussed. The patient is motivated to initiate immunotherapy if insurance coverage is favorable. He/She will let us know how he/she would like to proceed.  Aeroallergen avoidance measures have been discussed and provided in written form.  A prescription has been provided for azelastine nasal spray, 1-2 sprays per nostril 2 times daily as needed. Proper nasal spray technique has been discussed and demonstrated.   Nasal saline lavage (NeilMed) has been recommended as needed and prior to medicated nasal sprays along with instructions for proper administration.  A prescription has been provided for carbinoxamine, 4 mg every 8 hours if needed.  For thick post nasal drainage, add guaifenesin 1200 mg (Mucinex Maximum Strength)  twice daily as needed with adequate hydration as discussed.  The risks and benefits of aeroallergen immunotherapy have been discussed. The patient is motivated to initiate immunotherapy to reduce symptoms and decrease medication requirement. Informed consent has been signed and allergen vaccine orders have been submitted. Medications will be decreased or discontinued as symptom relief from immunotherapy becomes evident.

## 2019-01-02 NOTE — Progress Notes (Signed)
New Patient Note  RE: [name] MRN: [id number] DOB: Feb 20, 1981 Date of Office Visit: 01/02/2019  Referring provider: Selina Ortiz, Bobby, MD Primary care provider: Selina Ortiz, Bobby, MD  Chief Complaint: Asthma, Allergic Rhinitis , and Sinus Problem   History of present illness: [name] is a 38 y.o. female seen today in consultation requested by Anne CooleyBobby Witten, MD.  She reports that she has had asthma symptoms since high school.  She had managed her asthma with albuterol as needed until 2.5 years ago when she was started on Symbicort 160-4.5 g, 2 inhalations via spacer device twice daily.  The Symbicort has been helpful, however she still experiences shortness of breath and wheezing, particularly with mild/moderate activity, heat, and when she is outdoors.  Recently, she has been wheezing every evening, however only uses albuterol rescue 2 times per week.  She reports that she has "coughed up green phlegm every day" for the past 8 to 10 years.  She states that she has had pneumonia twice over the past 12 months and has had pneumonia "frequently" prior to that.  She typically has 2 or 3 sinus infections per year requiring antibiotics and/or corticosteroids.  She states that she has urinary tract infections "all the time."  She denies recurrent skin infections or GI infections. She experiences nasal congestion, rhinorrhea, sneezing, postnasal drainage, nasal pruritus, ocular pruritus, and sinus pressure.  No significant seasonal symptom variation has been noted nor have specific environmental triggers been identified.  She attempts to control the symptoms with loratadine/pseudoephedrine every morning and 20 mg of cetirizine every afternoon.  She is not using a medicated nasal spray but does use nasal saline rinse. [name] has eczema which involves her elbows.  The eczema is mild and typically well controlled with moisturizing lotion.  Assessment and plan: Moderate persistent asthma Currently  with suboptimal control, we will step up therapy at this time.  Tobacco cessation has been discussed and encouraged.  A prescription has been provided for Spiriva Respimat 1.25 g, 2 inhalations daily.  Continue Symbicort 160-4.5 g, 2 inhalations via spacer device twice daily, and albuterol HFA, 1 to 2 inhalations every 4-6 hours if needed.  I have recommended albuterol, 1 to 2 inhalations, 15 minutes prior to exercise.  Subjective and objective measures of pulmonary function will be followed and the treatment plan will be adjusted accordingly.  Perennial and seasonal allergic rhinitis  The risks and benefits of aeroallergen immunotherapy have been discussed. The patient is motivated to initiate immunotherapy if insurance coverage is favorable. He/She will let us know how he/she would like to proceed.  Aeroallergen avoidance measures have been discussed and provided in written form.  A prescription has been provided for azelastine nasal spray, 1-2 sprays per nostril 2 times daily as needed. Proper nasal spray technique has been discussed and demonstrated.   Nasal saline lavage (NeilMed) has been recommended as needed and prior to medicated nasal sprays along with instructions for proper administration.  A prescription has been provided for carbinoxamine, 4 mg every 8 hours if needed.  For thick post nasal drainage, add guaifenesin 1200 mg (Mucinex Maximum Strength)  twice daily as needed with adequate hydration as discussed.  The risks and benefits of aeroallergen immunotherapy have been discussed. The patient is motivated to initiate immunotherapy to reduce symptoms and decrease medication requirement. Informed consent has been signed and allergen vaccine orders have been submitted. Medications will be decreased or discontinued as symptom relief from immunotherapy becomes evident.  Allergic conjunctivitis  Treatment plan as outlined above for allergic rhinitis.  A prescription has  been provided for Pazeo, one drop per eye daily as needed.  I have also recommended eye lubricant drops (i.e., Natural Tears) as needed.  Recurrent infections Immunocompetence will be assessed with labs.  The following labs have been ordered: CBC with differential, IgG, IgA and IgM, tetanus IgG titers, and pneumococcal IgG titers.  If pre-vaccination titers are low, post-vaccination titers will be drawn to assess response.    The patient will be called with further recommendations and follow-up instructions once the labs have returned.   Meds ordered this encounter  Medications  . Tiotropium Bromide Monohydrate (SPIRIVA RESPIMAT) 1.25 MCG/ACT AERS    Sig: Inhale 2 sprays into the lungs daily.    Dispense:  4 g    Refill:  5  . DISCONTD: Azelastine HCl 0.15 % SOLN    Sig: Place 1-2 sprays into both nostrils 2 (two) times daily as needed.    Dispense:  30 mL    Refill:  5  . Carbinoxamine Maleate 4 MG TABS    Sig: Take 1 tablet (4 mg total) by mouth every 8 (eight) hours as needed.    Dispense:  30 tablet    Refill:  3  . Olopatadine HCl (PAZEO) 0.7 % SOLN    Sig: Place 1 drop into both eyes 1 day or 1 dose.    Dispense:  2.5 mL    Refill:  5    Diagnostics: Spirometry: FVC is 3.16 L and FEV1 is 2.61 L with 180 mL postbronchodilator improvement.  This study was performed while the patient was asymptomatic.  Please see scanned spirometry results for details. Allergy skin testing: Positive to grass pollen, weed pollen, ragweed pollen, tree pollen, molds, cat hair, dog epithelia, cockroach antigen, and dust mite antigen.  Physical examination: Blood pressure 122/78, pulse 100, temperature 97.7 F (36.5 C), temperature source Temporal, resp. rate 18, height 5' 0.8" (1.544 m), weight 155 lb (70.3 kg), SpO2 99 %.  General: Alert, interactive, in no acute distress. HEENT: TMs pearly gray, turbinates moderately edematous with thick discharge, post-pharynx moderately erythematous.  Neck: Supple without lymphadenopathy. Lungs: Clear to auscultation without wheezing, rhonchi or rales. CV: Normal S1, S2 without murmurs. Abdomen: Nondistended, nontender. Skin: Warm and dry, without lesions or rashes. Extremities:  No clubbing, cyanosis or edema. Neuro:   Grossly intact.  Review of systems:  Review of systems negative except as noted in HPI / PMHx or noted below: Review of Systems  Constitutional: Negative.   HENT: Negative.   Eyes: Negative.   Respiratory: Negative.   Cardiovascular: Negative.   Gastrointestinal: Negative.   Genitourinary: Negative.   Musculoskeletal: Negative.   Skin: Negative.   Neurological: Negative.   Endo/Heme/Allergies: Negative.   Psychiatric/Behavioral: Negative.     Past medical history:  Past Medical History:  Diagnosis Date  . Bilateral flank pain   . Bilateral renal stones    right > left  . Bipolar 1 disorder (HCC)   . Claustrophobia   . Complication of anesthesia    claustrophobic- jittery   . Exercise-induced asthma   . Frequency of urination   . GERD (gastroesophageal reflux disease)    watches diet  . History of acute pancreatitis    04-19-2013;  12/ 2012;  09/ 2011  . History of kidney stones    since age 38  . History of recurrent UTIs   . Insulin dependent type 2 diabetes mellitus (HCC)  endocrinologist-- dr Viona Gilmore. Tamala Julian ([facility]) per note last A1c 9.9 on 11-15-2018  . Peripheral neuropathy   . Renal cyst, right urologist--- dr Tresa Moore   non-complex  . Urgency of urination   . Wears glasses     Past surgical history:  Past Surgical History:  Procedure Laterality Date  . CYSTOSCOPY W/ URETERAL STENT PLACEMENT Right 11/15/2018   Procedure: CYSTOSCOPY WITH RETROGRADE PYELOGRAM/URETERAL STENT PLACEMENT;  Surgeon: Lucas Mallow, MD;  Location: [facility];  Service: Urology;  Laterality: Right;  . CYSTOSCOPY WITH RETROGRADE PYELOGRAM, URETEROSCOPY AND STENT PLACEMENT Bilateral 05/31/2016    Procedure: CYSTOSCOPY WITH BILATERAL RETROGRADE PYELOGRAM, RIGHT URETEROSCOPY, RIGHT STENT PLACEMENT, RIGHT LASER LITHOTRIPSY;  Surgeon: Alexis Frock, MD;  Location: [facility];  Service: Urology;  Laterality: Bilateral;  . CYSTOSCOPY WITH RETROGRADE PYELOGRAM, URETEROSCOPY AND STENT PLACEMENT Bilateral 11/22/2018   Procedure: CYSTOSCOPY WITH RETROGRADE PYELOGRAM, URETEROSCOPY AND STENT PLACEMENT;  Surgeon: Alexis Frock, MD;  Location: [facility];  Service: Urology;  Laterality: Bilateral;  . ERCP W/ SPHICTEROTOMY  01/28/2010  . EUS  08/02/2011   Procedure: UPPER ENDOSCOPIC ULTRASOUND (EUS) RADIAL;  Surgeon: Landry Dyke, MD;  Location: [facility];  Service: Endoscopy;  Laterality: N/A;  . FINE NEEDLE ASPIRATION  08/02/2011   Procedure: FINE NEEDLE ASPIRATION (FNA) RADIAL;  Surgeon: Landry Dyke, MD;  Location: [facility];  Service: Endoscopy;  Laterality: N/A;  . HOLMIUM LASER APPLICATION Bilateral 4/62/7035   Procedure: HOLMIUM LASER APPLICATION;  Surgeon: Alexis Frock, MD;  Location: [facility];  Service: Urology;  Laterality: Bilateral;  . KNEE ARTHROSCOPY Left 06-17-2018   @[email or web address]   . LAPAROSCOPIC CHOLECYSTECTOMY  07/25/2010    Family history: History reviewed. No pertinent family history.  Social history: Social History   Socioeconomic History  . Marital status: Married    Spouse name: Not on file  . Number of children: Not on file  . Years of education: Not on file  . Highest education level: Not on file  Occupational History  . Not on file  Social Needs  . Financial resource strain: Not on file  . Food insecurity    Worry: Not on file    Inability: Not on file  . Transportation needs    Medical: Not on file    Non-medical: Not on file  Tobacco Use  . Smoking status: Current Every Day Smoker    Packs/day: 1.00    Years: 16.00    Pack years: 16.00    Types: Cigarettes  . Smokeless tobacco: Never Used  .  Tobacco comment: 11-21-2018  smoked for 15 yrs stopped 2013,  started back  yr ago  Substance and Sexual Activity  . Alcohol use: Not Currently  . Drug use: Never  . Sexual activity: Yes    Birth control/protection: I.U.D.    Comment: Mirena placed 07/ 2017  Lifestyle  . Physical activity    Days per week: Not on file    Minutes per session: Not on file  . Stress: Not on file  Relationships  . Social Herbalist on phone: Not on file    Gets together: Not on file    Attends religious service: Not on file    Active member of club or organization: Not on file    Attends meetings of clubs or organizations: Not on file    Relationship status: Not on file  . Intimate partner violence    Fear of current  or ex partner: Not on file    Emotionally abused: Not on file    Physically abused: Not on file    Forced sexual activity: Not on file  Other Topics Concern  . Not on file  Social History Narrative  . Not on file   Environmental History: The patient lives in a 37 year old house with carpeting throughout, gas heat, and central air.  There are ferrets and lizards in the home which do not have access to her bedroom.  There is mold/water damage in the home.  She smokes half pack of cigarettes per day and has done so since May 2019.  Allergies as of 01/02/2019      Reactions   Penicillins Anaphylaxis   Depakote Er [divalproex Sodium Er] Other (See Comments)    Hx recurrent pancreatitis   Lactose Intolerance (gi)    Metformin Diarrhea   Pristiq [desvenlafaxine] Hives      Medication List       Accurate as of January 02, 2019  5:22 PM. If you have any questions, ask your nurse or doctor.        albuterol 108 (90 Base) MCG/ACT inhaler Commonly known as: VENTOLIN HFA Inhale 1-2 puffs into the lungs every 4 (four) hours as needed for wheezing or shortness of breath.   ALPRAZolam 0.25 MG tablet Commonly known as: XANAX Take 0.25 mg by mouth as needed for anxiety. PRN as needed    asenapine 5 MG Subl 24 hr tablet Commonly known as: SAPHRIS Place 5 mg under the tongue 2 (two) times daily. Take 5mg  in morning and 10mg  at night   azelastine 0.1 % nasal spray Commonly known as: ASTELIN Place 1-2 sprays into both nostrils 2 (two) times daily as needed for rhinitis. Started by: Maurine SimmeringLogan D Freeman, CMA   budesonide-formoterol 160-4.5 MCG/ACT inhaler Commonly known as: SYMBICORT Inhale 2 puffs into the lungs as needed.   Carbinoxamine Maleate 4 MG Tabs Take 1 tablet (4 mg total) by mouth every 8 (eight) hours as needed. Started by: Wellington Hampshire Carter Boots Mcglown, MD   cetirizine 10 MG tablet Commonly known as: ZYRTEC Take 10 mg by mouth as needed for allergies.   gabapentin 600 MG tablet Commonly known as: NEURONTIN Take 1,200 mg by mouth at bedtime.   HumuLIN R U-500 KwikPen 500 UNIT/ML kwikpen Generic drug: insulin regular human CONCENTRATED Inject into the skin 3 (three) times daily with meals.   ibuprofen 200 MG tablet Commonly known as: ADVIL Take 800 mg by mouth every 6 (six) hours as needed for moderate pain.   insulin detemir 100 UNIT/ML injection Commonly known as: LEVEMIR Inject 80 Units into the skin at bedtime.   insulin lispro 100 UNIT/ML injection Commonly known as: HUMALOG Inject into the skin 4 (four) times daily. Sliding scale 16 units to 28units   levocetirizine 5 MG tablet Commonly known as: XYZAL Take 5 mg by mouth as needed.   levofloxacin 500 MG tablet Commonly known as: LEVAQUIN Take 500 mg by mouth daily.   oxyCODONE 5 MG immediate release tablet Commonly known as: Oxy IR/ROXICODONE Take 1 tablet (5 mg total) by mouth every 4 (four) hours as needed for moderate pain.   Pazeo 0.7 % Soln Generic drug: Olopatadine HCl Place 1 drop into both eyes 1 day or 1 dose. Started by: Wellington Hampshire Carter Tere Mcconaughey, MD   ranitidine 150 MG tablet Commonly known as: ZANTAC Take 150 mg by mouth as needed for heartburn.   Spiriva Respimat 1.25 MCG/ACT Aers  Generic  drug: Tiotropium Bromide Monohydrate Inhale 2 sprays into the lungs daily. Started by: Wellington Hampshire, MD   traZODone 150 MG tablet Commonly known as: DESYREL Take by mouth at bedtime.   Trintellix 20 MG Tabs tablet Generic drug: vortioxetine HBr Take 20 mg by mouth daily. Take at bedtime       Known medication allergies: Allergies  Allergen Reactions  . Penicillins Anaphylaxis  . Depakote Er [Divalproex Sodium Er] Other (See Comments)     Hx recurrent pancreatitis  . Lactose Intolerance (Gi)   . Metformin Diarrhea  . Pristiq [Desvenlafaxine] Hives    I appreciate the opportunity to take part in Dawt's care. Please do not hesitate to contact me with questions.  Sincerely,   R. Jorene Guest, MD

## 2019-01-02 NOTE — Assessment & Plan Note (Addendum)
Currently with suboptimal control, we will step up therapy at this time.  Tobacco cessation has been discussed and encouraged.  A prescription has been provided for Spiriva Respimat 1.25 g, 2 inhalations daily.  Continue Symbicort 160-4.5 g, 2 inhalations via spacer device twice daily, and albuterol HFA, 1 to 2 inhalations every 4-6 hours if needed.  I have recommended albuterol, 1 to 2 inhalations, 15 minutes prior to exercise.  Subjective and objective measures of pulmonary function will be followed and the treatment plan will be adjusted accordingly.

## 2019-01-02 NOTE — Assessment & Plan Note (Signed)
   Immunocompetence will be assessed with labs.  The following labs have been ordered: CBC with differential, IgG, IgA and IgM, tetanus IgG titers, and pneumococcal IgG titers.  If pre-vaccination titers are low, post-vaccination titers will be drawn to assess response.    The patient will be called with further recommendations and follow-up instructions once the labs have returned. 

## 2019-01-02 NOTE — Patient Instructions (Addendum)
Moderate persistent asthma Currently with suboptimal control, we will step up therapy at this time.  Tobacco cessation has been discussed and encouraged.  A prescription has been provided for Spiriva Respimat 1.25 g, 2 inhalations daily.  Continue Symbicort 160-4.5 g, 2 inhalations via spacer device twice daily, and albuterol HFA, 1 to 2 inhalations every 4-6 hours if needed.  I have recommended albuterol, 1 to 2 inhalations, 15 minutes prior to exercise.  Subjective and objective measures of pulmonary function will be followed and the treatment plan will be adjusted accordingly.  Perennial and seasonal allergic rhinitis  The risks and benefits of aeroallergen immunotherapy have been discussed. The patient is motivated to initiate immunotherapy if insurance coverage is favorable. He/She will let us know how he/she would like to proceed.  Aeroallergen avoidance measures have been discussed and provided in written form.  A prescription has been provided for azelastine nasal spray, 1-2 sprays per nostril 2 times daily as needed. Proper nasal spray technique has been discussed and demonstrated.   Nasal saline lavage (NeilMed) has been recommended as needed and prior to medicated nasal sprays along with instructions for proper administration.  A prescription has been provided for carbinoxamine, 4 mg every 8 hours if needed.  For thick post nasal drainage, add guaifenesin 1200 mg (Mucinex Maximum Strength)  twice daily as needed with adequate hydration as discussed.  The risks and benefits of aeroallergen immunotherapy have been discussed. The patient is motivated to initiate immunotherapy to reduce symptoms and decrease medication requirement. Informed consent has been signed and allergen vaccine orders have been submitted. Medications will be decreased or discontinued as symptom relief from immunotherapy becomes evident.  Allergic conjunctivitis  Treatment plan as outlined above for  allergic rhinitis.  A prescription has been provided for Pazeo, one drop per eye daily as needed.  I have also recommended eye lubricant drops (i.e., Natural Tears) as needed.  Recurrent infections Immunocompetence will be assessed with labs.  The following labs have been ordered: CBC with differential, IgG, IgA and IgM, tetanus IgG titers, and pneumococcal IgG titers.  If pre-vaccination titers are low, post-vaccination titers will be drawn to assess response.    The patient will be called with further recommendations and follow-up instructions once the labs have returned.   Return in about 2 months (around 03/05/2019), or if symptoms worsen or fail to improve.  Control of Dust Mite Allergen  House dust mites play a major role in allergic asthma and rhinitis.  They occur in environments with high humidity wherever human skin, the food for dust mites is found. High levels have been detected in dust obtained from mattresses, pillows, carpets, upholstered furniture, bed covers, clothes and soft toys.  The principal allergen of the house dust mite is found in its feces.  A gram of dust may contain 1,000 mites and 250,000 fecal particles.  Mite antigen is easily measured in the air during house cleaning activities.    1. Encase mattresses, including the box spring, and pillow, in an air tight cover.  Seal the zipper end of the encased mattresses with wide adhesive tape. 2. Wash the bedding in water of 130 degrees Farenheit weekly.  Avoid cotton comforters/quilts and flannel bedding: the most ideal bed covering is the dacron comforter. 3. Remove all upholstered furniture from the bedroom. 4. Remove carpets, carpet padding, rugs, and non-washable window drapes from the bedroom.  Wash drapes weekly or use plastic window coverings. 5. Remove all non-washable stuffed toys from the bedroom.  Wash stuffed toys weekly. 6. Have the room cleaned frequently with a vacuum cleaner and a damp dust-mop.  The  patient should not be in a room which is being cleaned and should wait 1 hour after cleaning before going into the room. 7. Close and seal all heating outlets in the bedroom.  Otherwise, the room will become filled with dust-laden air.  An electric heater can be used to heat the room. Reduce indoor humidity to less than 50%.  Do not use a humidifier.   Reducing Pollen Exposure  The American Academy of Allergy, Asthma and Immunology suggests the following steps to reduce your exposure to pollen during allergy seasons.    1. Do not hang sheets or clothing out to dry; pollen may collect on these items. 2. Do not mow lawns or spend time around freshly cut grass; mowing stirs up pollen. 3. Keep windows closed at night.  Keep car windows closed while driving. 4. Minimize morning activities outdoors, a time when pollen counts are usually at their highest. 5. Stay indoors as much as possible when pollen counts or humidity is high and on windy days when pollen tends to remain in the air longer. 6. Use air conditioning when possible.  Many air conditioners have filters that trap the pollen spores. 7. Use a HEPA room air filter to remove pollen form the indoor air you breathe.   Control of Dog or Cat Allergen  Avoidance is the best way to manage a dog or cat allergy. If you have a dog or cat and are allergic to dog or cats, consider removing the dog or cat from the home. If you have a dog or cat but don't want to find it a new home, or if your family wants a pet even though someone in the household is allergic, here are some strategies that may help keep symptoms at bay:  1. Keep the pet out of your bedroom and restrict it to only a few rooms. Be advised that keeping the dog or cat in only one room will not limit the allergens to that room. 2. Don't pet, hug or kiss the dog or cat; if you do, wash your hands with soap and water. 3. High-efficiency particulate air (HEPA) cleaners run continuously in a  bedroom or living room can reduce allergen levels over time. 4. Place electrostatic material sheet in the air inlet vent in the bedroom. 5. Regular use of a high-efficiency vacuum cleaner or a central vacuum can reduce allergen levels. 6. Giving your dog or cat a bath at least once a week can reduce airborne allergen.   Control of Mold Allergen  Mold and fungi can grow on a variety of surfaces provided certain temperature and moisture conditions exist.  Outdoor molds grow on plants, decaying vegetation and soil.  The major outdoor mold, Alternaria and Cladosporium, are found in very high numbers during hot and dry conditions.  Generally, a late Summer - Fall peak is seen for common outdoor fungal spores.  Rain will temporarily lower outdoor mold spore count, but counts rise rapidly when the rainy period ends.  The most important indoor molds are Aspergillus and Penicillium.  Dark, humid and poorly ventilated basements are ideal sites for mold growth.  The next most common sites of mold growth are the bathroom and the kitchen.  Outdoor [employer] 1. Use air conditioning and keep windows closed 2. Avoid exposure to decaying vegetation. 3. Avoid leaf raking. 4. Avoid grain handling. 5. Consider  wearing a face mask if working in moldy areas.  Indoor Mold Control 1. Maintain humidity below 50%. 2. Clean washable surfaces with 5% bleach solution. 3. Remove sources e.g. Contaminated carpets.   Control of Cockroach Allergen  Cockroach allergen has been identified as an important cause of acute attacks of asthma, especially in urban settings.  There are fifty-five species of cockroach that exist in the [place], however only three, the [place], [place] and Oriental species produce allergen that can affect patients with Asthma.  Allergens can be obtained from fecal particles, egg casings and secretions from cockroaches.    1. Remove food sources. 2. Reduce access to water. 3. Seal access  and entry points. 4. Spray runways with 0.5-1% Diazinon or Chlorpyrifos 5. Blow boric acid power under stoves and refrigerator. 6. Place bait stations (hydramethylnon) at feeding sites.

## 2019-01-06 ENCOUNTER — Telehealth: Payer: Self-pay | Admitting: *Deleted

## 2019-01-06 NOTE — Telephone Encounter (Signed)
PA approved for Spiriva Respimat 1.25 through [facility]

## 2019-01-10 ENCOUNTER — Telehealth: Payer: Self-pay

## 2019-01-10 ENCOUNTER — Telehealth: Payer: Self-pay | Admitting: Allergy and Immunology

## 2019-01-10 NOTE — Telephone Encounter (Signed)
Prior Auth was done and approved for Azelastine, approval faxed to [facility] @ [phone]

## 2019-01-10 NOTE — Telephone Encounter (Signed)
Pt for azelastine was approved via [facility] and faxed to pharmacy approval for spirivi was approved 01/06/2019 via [facility]

## 2019-01-10 NOTE — Telephone Encounter (Signed)
PT came in office to let us know that her pharmacy is waiting for approval for spiriva inhaler and astelin nasal spray.

## 2019-01-13 DIAGNOSIS — J3089 Other allergic rhinitis: Secondary | ICD-10-CM | POA: Diagnosis not present

## 2019-01-13 NOTE — Progress Notes (Signed)
VIALS EXP 01-13-2020 

## 2019-01-14 LAB — CBC WITH DIFFERENTIAL/PLATELET
Basophils Absolute: 0 10*3/uL (ref 0.0–0.2)
Basos: 0 %
EOS (ABSOLUTE): 0.2 10*3/uL (ref 0.0–0.4)
Eos: 2 %
Hematocrit: 41.9 % (ref 34.0–46.6)
Hemoglobin: 13.3 g/dL (ref 11.1–15.9)
Immature Grans (Abs): 0 10*3/uL (ref 0.0–0.1)
Immature Granulocytes: 0 %
Lymphocytes Absolute: 2.2 10*3/uL (ref 0.7–3.1)
Lymphs: 24 %
MCH: 29.1 pg (ref 26.6–33.0)
MCHC: 31.7 g/dL (ref 31.5–35.7)
MCV: 92 fL (ref 79–97)
Monocytes Absolute: 0.5 10*3/uL (ref 0.1–0.9)
Monocytes: 6 %
Neutrophils Absolute: 6.2 10*3/uL (ref 1.4–7.0)
Neutrophils: 68 %
Platelets: 293 10*3/uL (ref 150–450)
RBC: 4.57 x10E6/uL (ref 3.77–5.28)
RDW: 12.6 % (ref 11.7–15.4)
WBC: 9.1 10*3/uL (ref 3.4–10.8)

## 2019-01-14 LAB — STREP PNEUMONIAE 14 SEROTYPES IGG
Pneumo Ab Type 1*: 0.8 ug/mL — ABNORMAL LOW (ref >1.3)
Pneumo Ab Type 12 (12F)*: 0.4 ug/mL — ABNORMAL LOW (ref >1.3)
Pneumo Ab Type 14*: 8.6 ug/mL (ref >1.3)
Pneumo Ab Type 19 (19F)*: 2.5 ug/mL (ref >1.3)
Pneumo Ab Type 23 (23F)*: 0.6 ug/mL — ABNORMAL LOW (ref >1.3)
Pneumo Ab Type 26 (6B)*: 0.7 ug/mL — ABNORMAL LOW (ref >1.3)
Pneumo Ab Type 3*: 3.3 ug/mL (ref >1.3)
Pneumo Ab Type 4*: 0.2 ug/mL — ABNORMAL LOW (ref >1.3)
Pneumo Ab Type 51 (7F)*: 17 ug/mL (ref >1.3)
Pneumo Ab Type 56 (18C)*: 1.9 ug/mL (ref >1.3)
Pneumo Ab Type 57 (19A)*: 8 ug/mL (ref >1.3)
Pneumo Ab Type 68 (9V)*: 1.6 ug/mL (ref >1.3)
Pneumo Ab Type 8*: 6.5 ug/mL (ref >1.3)
Pneumo Ab Type 9 (9N)*: 2.2 ug/mL (ref >1.3)

## 2019-01-14 LAB — IGG, IGA, IGM
IgA/Immunoglobulin A, Serum: 185 mg/dL (ref 87–352)
IgG (Immunoglobin G), Serum: 936 mg/dL (ref 586–1602)
IgM (Immunoglobulin M), Srm: 105 mg/dL (ref 26–217)

## 2019-01-14 LAB — TETANUS ANTIBODY, IGG: Tetanus Ab, IgG: 0.78 IU/mL (ref <0.10)

## 2019-01-24 ENCOUNTER — Ambulatory Visit: Payer: Self-pay

## 2019-01-24 ENCOUNTER — Ambulatory Visit (INDEPENDENT_AMBULATORY_CARE_PROVIDER_SITE_OTHER): Payer: Medicaid Other

## 2019-01-24 DIAGNOSIS — J309 Allergic rhinitis, unspecified: Secondary | ICD-10-CM | POA: Diagnosis not present

## 2019-01-24 NOTE — Progress Notes (Signed)
Immunotherapy   Patient Details  Name: [name] MRN: [id number] Date of Birth: 23-May-1981  01/24/2019  [name] started injections for  BLUE 1:100,000 (G-WEEDS-T-DM-D & M-C-CR) Following schedule: A  Frequency:2 times per week Epi-Pen:Epi-Pen Available  Consent signed and patient instructions given.   Kentavius Dettore 01/24/2019, 2:28 PM

## 2019-01-31 ENCOUNTER — Ambulatory Visit (INDEPENDENT_AMBULATORY_CARE_PROVIDER_SITE_OTHER): Payer: Medicaid Other

## 2019-01-31 DIAGNOSIS — J309 Allergic rhinitis, unspecified: Secondary | ICD-10-CM

## 2019-02-07 ENCOUNTER — Ambulatory Visit (INDEPENDENT_AMBULATORY_CARE_PROVIDER_SITE_OTHER): Payer: Medicaid Other

## 2019-02-07 DIAGNOSIS — J309 Allergic rhinitis, unspecified: Secondary | ICD-10-CM | POA: Diagnosis not present

## 2019-02-21 ENCOUNTER — Ambulatory Visit (INDEPENDENT_AMBULATORY_CARE_PROVIDER_SITE_OTHER): Payer: Medicaid Other

## 2019-02-21 DIAGNOSIS — J309 Allergic rhinitis, unspecified: Secondary | ICD-10-CM | POA: Diagnosis not present

## 2019-02-28 ENCOUNTER — Ambulatory Visit (INDEPENDENT_AMBULATORY_CARE_PROVIDER_SITE_OTHER): Payer: Medicaid Other

## 2019-02-28 DIAGNOSIS — J309 Allergic rhinitis, unspecified: Secondary | ICD-10-CM

## 2019-03-07 ENCOUNTER — Ambulatory Visit (INDEPENDENT_AMBULATORY_CARE_PROVIDER_SITE_OTHER): Payer: Medicaid Other

## 2019-03-07 DIAGNOSIS — J309 Allergic rhinitis, unspecified: Secondary | ICD-10-CM | POA: Diagnosis not present

## 2019-03-13 ENCOUNTER — Ambulatory Visit: Payer: Medicaid Other | Admitting: Allergy and Immunology

## 2019-04-03 ENCOUNTER — Ambulatory Visit (INDEPENDENT_AMBULATORY_CARE_PROVIDER_SITE_OTHER): Payer: Medicaid Other

## 2019-04-03 DIAGNOSIS — J309 Allergic rhinitis, unspecified: Secondary | ICD-10-CM

## 2019-04-17 ENCOUNTER — Ambulatory Visit (INDEPENDENT_AMBULATORY_CARE_PROVIDER_SITE_OTHER): Payer: Medicaid Other

## 2019-04-17 DIAGNOSIS — J309 Allergic rhinitis, unspecified: Secondary | ICD-10-CM

## 2019-04-24 NOTE — Progress Notes (Signed)
This encounter was created in error - please disregard.

## 2019-05-08 ENCOUNTER — Ambulatory Visit (INDEPENDENT_AMBULATORY_CARE_PROVIDER_SITE_OTHER): Payer: Medicaid Other

## 2019-05-08 DIAGNOSIS — J309 Allergic rhinitis, unspecified: Secondary | ICD-10-CM

## 2019-05-22 ENCOUNTER — Ambulatory Visit (INDEPENDENT_AMBULATORY_CARE_PROVIDER_SITE_OTHER): Payer: Medicaid Other

## 2019-05-22 DIAGNOSIS — J309 Allergic rhinitis, unspecified: Secondary | ICD-10-CM | POA: Diagnosis not present

## 2019-06-05 ENCOUNTER — Ambulatory Visit (INDEPENDENT_AMBULATORY_CARE_PROVIDER_SITE_OTHER): Payer: Medicaid Other

## 2019-06-05 DIAGNOSIS — J309 Allergic rhinitis, unspecified: Secondary | ICD-10-CM | POA: Diagnosis not present

## 2019-07-10 ENCOUNTER — Ambulatory Visit (INDEPENDENT_AMBULATORY_CARE_PROVIDER_SITE_OTHER): Payer: Medicaid Other

## 2019-07-10 DIAGNOSIS — J309 Allergic rhinitis, unspecified: Secondary | ICD-10-CM

## 2019-07-13 ENCOUNTER — Other Ambulatory Visit: Payer: Self-pay | Admitting: Allergy and Immunology

## 2019-07-16 ENCOUNTER — Ambulatory Visit (INDEPENDENT_AMBULATORY_CARE_PROVIDER_SITE_OTHER): Payer: Medicaid Other

## 2019-07-16 DIAGNOSIS — J309 Allergic rhinitis, unspecified: Secondary | ICD-10-CM

## 2019-07-24 ENCOUNTER — Encounter: Payer: Self-pay | Admitting: Allergy and Immunology

## 2019-07-24 ENCOUNTER — Ambulatory Visit (INDEPENDENT_AMBULATORY_CARE_PROVIDER_SITE_OTHER): Payer: Medicaid Other

## 2019-07-24 DIAGNOSIS — J309 Allergic rhinitis, unspecified: Secondary | ICD-10-CM

## 2019-07-31 ENCOUNTER — Ambulatory Visit (INDEPENDENT_AMBULATORY_CARE_PROVIDER_SITE_OTHER): Payer: Medicaid Other

## 2019-07-31 DIAGNOSIS — J309 Allergic rhinitis, unspecified: Secondary | ICD-10-CM | POA: Diagnosis not present

## 2019-08-07 ENCOUNTER — Encounter: Payer: Self-pay | Admitting: Allergy and Immunology

## 2019-08-07 ENCOUNTER — Ambulatory Visit (INDEPENDENT_AMBULATORY_CARE_PROVIDER_SITE_OTHER): Payer: Medicaid Other

## 2019-08-07 DIAGNOSIS — J309 Allergic rhinitis, unspecified: Secondary | ICD-10-CM | POA: Diagnosis not present

## 2019-08-14 ENCOUNTER — Ambulatory Visit (INDEPENDENT_AMBULATORY_CARE_PROVIDER_SITE_OTHER): Payer: Medicaid Other

## 2019-08-14 ENCOUNTER — Encounter: Payer: Self-pay | Admitting: Allergy and Immunology

## 2019-08-14 DIAGNOSIS — J309 Allergic rhinitis, unspecified: Secondary | ICD-10-CM | POA: Diagnosis not present

## 2019-08-18 ENCOUNTER — Other Ambulatory Visit: Payer: Self-pay | Admitting: Allergy and Immunology

## 2019-08-28 ENCOUNTER — Encounter: Payer: Self-pay | Admitting: Allergy and Immunology

## 2019-08-28 ENCOUNTER — Ambulatory Visit (INDEPENDENT_AMBULATORY_CARE_PROVIDER_SITE_OTHER): Payer: Medicaid Other | Admitting: *Deleted

## 2019-08-28 DIAGNOSIS — J309 Allergic rhinitis, unspecified: Secondary | ICD-10-CM

## 2019-09-04 ENCOUNTER — Encounter: Payer: Self-pay | Admitting: Allergy and Immunology

## 2019-09-04 ENCOUNTER — Ambulatory Visit (INDEPENDENT_AMBULATORY_CARE_PROVIDER_SITE_OTHER): Payer: Medicaid Other

## 2019-09-04 DIAGNOSIS — J309 Allergic rhinitis, unspecified: Secondary | ICD-10-CM | POA: Diagnosis not present

## 2019-09-04 NOTE — Progress Notes (Signed)
Mix down vials to [facility]

## 2019-09-07 ENCOUNTER — Other Ambulatory Visit: Payer: Self-pay | Admitting: Allergy and Immunology

## 2019-09-11 ENCOUNTER — Ambulatory Visit (INDEPENDENT_AMBULATORY_CARE_PROVIDER_SITE_OTHER): Payer: Medicaid Other | Admitting: *Deleted

## 2019-09-11 ENCOUNTER — Encounter: Payer: Self-pay | Admitting: Allergy and Immunology

## 2019-09-11 DIAGNOSIS — J309 Allergic rhinitis, unspecified: Secondary | ICD-10-CM | POA: Diagnosis not present

## 2019-09-18 ENCOUNTER — Ambulatory Visit (INDEPENDENT_AMBULATORY_CARE_PROVIDER_SITE_OTHER): Payer: Medicaid Other | Admitting: *Deleted

## 2019-09-18 ENCOUNTER — Encounter: Payer: Self-pay | Admitting: Allergy and Immunology

## 2019-09-18 DIAGNOSIS — J309 Allergic rhinitis, unspecified: Secondary | ICD-10-CM | POA: Diagnosis not present

## 2019-09-25 ENCOUNTER — Ambulatory Visit (INDEPENDENT_AMBULATORY_CARE_PROVIDER_SITE_OTHER): Payer: Medicaid Other | Admitting: *Deleted

## 2019-09-25 ENCOUNTER — Encounter: Payer: Self-pay | Admitting: Pediatrics

## 2019-09-25 DIAGNOSIS — J309 Allergic rhinitis, unspecified: Secondary | ICD-10-CM

## 2019-09-30 ENCOUNTER — Ambulatory Visit (INDEPENDENT_AMBULATORY_CARE_PROVIDER_SITE_OTHER): Payer: Medicaid Other

## 2019-09-30 DIAGNOSIS — J309 Allergic rhinitis, unspecified: Secondary | ICD-10-CM

## 2019-10-05 ENCOUNTER — Other Ambulatory Visit: Payer: Self-pay | Admitting: Allergy and Immunology

## 2019-10-09 ENCOUNTER — Ambulatory Visit (INDEPENDENT_AMBULATORY_CARE_PROVIDER_SITE_OTHER): Payer: Medicaid Other

## 2019-10-09 ENCOUNTER — Encounter: Payer: Self-pay | Admitting: Allergy and Immunology

## 2019-10-09 DIAGNOSIS — J309 Allergic rhinitis, unspecified: Secondary | ICD-10-CM | POA: Diagnosis not present

## 2019-10-16 ENCOUNTER — Ambulatory Visit (INDEPENDENT_AMBULATORY_CARE_PROVIDER_SITE_OTHER): Payer: Medicaid Other

## 2019-10-16 ENCOUNTER — Encounter: Payer: Self-pay | Admitting: Pediatrics

## 2019-10-16 DIAGNOSIS — J309 Allergic rhinitis, unspecified: Secondary | ICD-10-CM | POA: Diagnosis not present

## 2019-10-30 ENCOUNTER — Ambulatory Visit (INDEPENDENT_AMBULATORY_CARE_PROVIDER_SITE_OTHER): Payer: Medicaid Other

## 2019-10-30 DIAGNOSIS — J309 Allergic rhinitis, unspecified: Secondary | ICD-10-CM

## 2019-11-06 ENCOUNTER — Encounter: Payer: Self-pay | Admitting: Allergy and Immunology

## 2019-11-06 ENCOUNTER — Ambulatory Visit (INDEPENDENT_AMBULATORY_CARE_PROVIDER_SITE_OTHER): Payer: Medicaid Other

## 2019-11-06 DIAGNOSIS — J309 Allergic rhinitis, unspecified: Secondary | ICD-10-CM

## 2019-11-19 ENCOUNTER — Ambulatory Visit (INDEPENDENT_AMBULATORY_CARE_PROVIDER_SITE_OTHER): Payer: Medicaid Other | Admitting: *Deleted

## 2019-11-19 DIAGNOSIS — J309 Allergic rhinitis, unspecified: Secondary | ICD-10-CM | POA: Diagnosis not present

## 2019-12-03 ENCOUNTER — Encounter: Payer: Self-pay | Admitting: Allergy and Immunology

## 2019-12-03 ENCOUNTER — Other Ambulatory Visit: Payer: Self-pay

## 2019-12-03 ENCOUNTER — Ambulatory Visit (INDEPENDENT_AMBULATORY_CARE_PROVIDER_SITE_OTHER): Payer: Medicaid Other | Admitting: Allergy and Immunology

## 2019-12-03 VITALS — BP 128/68 | HR 88 | Temp 98.2°F | Resp 18 | Ht 60.8 in | Wt 161.4 lb

## 2019-12-03 DIAGNOSIS — J4540 Moderate persistent asthma, uncomplicated: Secondary | ICD-10-CM

## 2019-12-03 DIAGNOSIS — H1013 Acute atopic conjunctivitis, bilateral: Secondary | ICD-10-CM

## 2019-12-03 DIAGNOSIS — Z720 Tobacco use: Secondary | ICD-10-CM

## 2019-12-03 DIAGNOSIS — J3089 Other allergic rhinitis: Secondary | ICD-10-CM

## 2019-12-03 DIAGNOSIS — J309 Allergic rhinitis, unspecified: Secondary | ICD-10-CM

## 2019-12-03 MED ORDER — ALBUTEROL SULFATE HFA 108 (90 BASE) MCG/ACT IN AERS: 1.0000 | INHALATION_SPRAY | RESPIRATORY_TRACT | 1 refills | Status: DC | PRN

## 2019-12-03 MED ORDER — AZELASTINE HCL 0.1 % NA SOLN: 1.0000 | Freq: Two times a day (BID) | NASAL | 5 refills | Status: DC | PRN

## 2019-12-03 MED ORDER — PAZEO 0.7 % OP SOLN: 1.0000 [drp] | Freq: Every day | OPHTHALMIC | 5 refills | Status: DC | PRN

## 2019-12-03 MED ORDER — CARBINOXAMINE MALEATE 4 MG PO TABS: ORAL_TABLET | ORAL | 5 refills | Status: DC

## 2019-12-03 MED ORDER — SPIRIVA RESPIMAT 1.25 MCG/ACT IN AERS: 2.0000 | INHALATION_SPRAY | Freq: Every day | RESPIRATORY_TRACT | 5 refills | Status: DC

## 2019-12-03 MED ORDER — BUDESONIDE-FORMOTEROL FUMARATE 160-4.5 MCG/ACT IN AERO: INHALATION_SPRAY | RESPIRATORY_TRACT | 5 refills | Status: DC

## 2019-12-03 NOTE — Progress Notes (Signed)
Follow-up Note  RE: [name] MRN: [id number] DOB: 1981/05/04 Date of Office Visit: 12/03/2019  Primary care provider: Selina Cooley, MD Referring provider: Selina Cooley, MD  History of present illness: [name] is a 39 y.o. female with persistent asthma and allergic rhinoconjunctivitis presenting today for follow-up.  She was previously seen in this clinic for her initial evaluation in July 2020.  She reports that in the interval since her previous visit her asthma control has been "really good."  She rarely requires albuterol rescue and denies limitations in normal daily activities and nocturnal awakenings due to lower respiratory symptoms.  She is currently taking Symbicort 160-4.5 g, 2 inhalations via spacer device twice daily and Spiriva 2 inhalations daily.  She is receiving immunotherapy injections without problems or complications.  She is also taking fluticasone nasal spray daily.  She reports that if she does not take fluticasone daily she experiences nasal congestion.  She ran out of carbinoxamine and needs a refill.  While out of carbinoxamine she has been using fexofenadine 180 mg 1-2 times daily as needed.  If she is not using olopatadine eyedrops, she experiences ocular pruritus.  She needs refills for her medications.  She reports that she has cut back smoking to half pack per day on average.  Assessment and plan: Moderate persistent asthma Currently well controlled.  Continued efforts towards tobacco cessation has been discussed and encouraged.  Continue Spiriva Respimat 1.25 g, 2 inhalations daily.  Continue Symbicort 160-4.5 g, 2 inhalations via spacer device twice daily, and albuterol HFA, 1 to 2 inhalations every 4-6 hours if needed and 15 minutes prior to exercise.  If subjective and objective measures of pulmonary function remain stable, we will consider stepping down therapy on the next visit.  Perennial and seasonal allergic  rhinitis Stable.  Continue appropriate allergen avoidance measures and immunotherapy injections per protocol.  Continue azelastine nasal spray as needed.  Nasal saline spray (i.e., Simply Saline) or nasal saline lavage (i.e., NeilMed) is recommended as needed and prior to medicated nasal sprays.  Continue carbinoxamine 4 mg every 8 hours as needed.  Refill prescriptions have been provided.  Medications will be decreased or discontinued as symptom relief from immunotherapy becomes evident.  Allergic conjunctivitis  Treatment plan as outlined above for allergic rhinitis.  Continue olopatadine eyedrops, 1 drop per eye daily as needed.  Eye lubricant drops (Natural Tears) as needed.   Meds ordered this encounter  Medications  . albuterol (VENTOLIN HFA) 108 (90 Base) MCG/ACT inhaler    Sig: Inhale 1-2 puffs into the lungs every 4 (four) hours as needed for wheezing or shortness of breath.    Dispense:  18 g    Refill:  1  . budesonide-formoterol (SYMBICORT) 160-4.5 MCG/ACT inhaler    Sig: Two puffs with spacer twice a day.    Dispense:  1 Inhaler    Refill:  5  . Tiotropium Bromide Monohydrate (SPIRIVA RESPIMAT) 1.25 MCG/ACT AERS    Sig: Inhale 2 puffs into the lungs daily.    Dispense:  4 g    Refill:  5  . azelastine (ASTELIN) 0.1 % nasal spray    Sig: Place 1-2 sprays into both nostrils 2 (two) times daily as needed for rhinitis.    Dispense:  30 mL    Refill:  5  . Carbinoxamine Maleate 4 MG TABS    Sig: TAKE 1 TABLET(4 MG) BY MOUTH EVERY 8 HOURS AS NEEDED    Dispense:  60 tablet  Refill:  5  . Olopatadine HCl (PAZEO) 0.7 % SOLN    Sig: Place 1 drop into both eyes daily as needed (for itchy eyes).    Dispense:  2.5 mL    Refill:  5    Diagnostics: Spirometry:  Normal with an FEV1 of 104% predicted. This study was performed while the patient was asymptomatic.  Please see scanned spirometry results for details.    Physical examination: Blood pressure 128/68,  pulse 88, temperature 98.2 F (36.8 C), temperature source Oral, resp. rate 18, height 5' 0.8" (1.544 m), weight 161 lb 6.4 oz (73.2 kg), SpO2 98 %.  General: Alert, interactive, in no acute distress. HEENT: TMs pearly gray, turbinates mildly edematous without discharge, post-pharynx mildly erythematous. Neck: Supple without lymphadenopathy. Lungs: Clear to auscultation without wheezing, rhonchi or rales. CV: Normal S1, S2 without murmurs. Skin: Warm and dry, without lesions or rashes.  The following portions of the patient's history were reviewed and updated as appropriate: allergies, current medications, past family history, past medical history, past social history, past surgical history and problem list.  Current Outpatient Medications  Medication Sig Dispense Refill  . albuterol (VENTOLIN HFA) 108 (90 Base) MCG/ACT inhaler Inhale 1-2 puffs into the lungs every 4 (four) hours as needed for wheezing or shortness of breath. 18 g 1  . ALPRAZolam (XANAX) 0.5 MG tablet Take 0.25-0.5 mg by mouth 2 (two) times daily as needed.    Marland Kitchen amitriptyline (ELAVIL) 50 MG tablet Take 50 mg by mouth at bedtime.    Marland Kitchen asenapine (SAPHRIS) 5 MG SUBL 24 hr tablet Place 5 mg under the tongue 2 (two) times daily. Take 5mg  in morning and 10mg  at night     . Asenapine Maleate 10 MG SUBL DISSOLVE 1 TABLET UNDER THE TONGUE EVERY NIGHT AT BEDTIME    . azelastine (ASTELIN) 0.1 % nasal spray Place 1-2 sprays into both nostrils 2 (two) times daily as needed for rhinitis. 30 mL 5  . BD PEN NEEDLE NANO 2ND GEN 32G X 4 MM MISC USE WITH INSULIN TWICE DAILY    . budesonide-formoterol (SYMBICORT) 160-4.5 MCG/ACT inhaler Two puffs with spacer twice a day. 1 Inhaler 5  . Carbinoxamine Maleate 4 MG TABS TAKE 1 TABLET(4 MG) BY MOUTH EVERY 8 HOURS AS NEEDED 60 tablet 5  . fexofenadine (KLS ALLER-FEX) 180 MG tablet Take 180 mg by mouth 2 (two) times daily.    Marland Kitchen gabapentin (NEURONTIN) 600 MG tablet Take 1,200 mg by mouth at bedtime.      Marland Kitchen ibuprofen (ADVIL,MOTRIN) 200 MG tablet Take 800 mg by mouth every 6 (six) hours as needed for moderate pain.     Marland Kitchen insulin regular human CONCENTRATED (HUMULIN R U-500 KWIKPEN) 500 UNIT/ML kwikpen Inject into the skin 3 (three) times daily with meals.    Marland Kitchen lithium carbonate (LITHOBID) 300 MG CR tablet Take 300 mg by mouth at bedtime.    . Olopatadine HCl (PAZEO) 0.7 % SOLN Place 1 drop into both eyes 1 day or 1 dose. 2.5 mL 5  . rosuvastatin (CRESTOR) 5 MG tablet Take 5 mg by mouth 2 (two) times a week.    . Tiotropium Bromide Monohydrate (SPIRIVA RESPIMAT) 1.25 MCG/ACT AERS Inhale 2 puffs into the lungs daily. 4 g 5  . traZODone (DESYREL) 150 MG tablet Take by mouth at bedtime.    . vortioxetine HBr (TRINTELLIX) 20 MG TABS tablet Take 20 mg by mouth daily. Take at bedtime     . cetirizine (ZYRTEC) 10  MG tablet Take 10 mg by mouth as needed for allergies.    Marland Kitchen levocetirizine (XYZAL) 5 MG tablet Take 5 mg by mouth as needed.     . Olopatadine HCl (PAZEO) 0.7 % SOLN Place 1 drop into both eyes daily as needed (for itchy eyes). 2.5 mL 5   No current facility-administered medications for this visit.    Allergies  Allergen Reactions  . Penicillins Anaphylaxis  . Depakote Er [Divalproex Sodium Er] Other (See Comments)     Hx recurrent pancreatitis  . Lactose Intolerance (Gi)   . Metformin Diarrhea  . Pristiq [Desvenlafaxine] Hives   Review of systems: Review of systems negative except as noted in HPI / PMHx.  Past Medical History:  Diagnosis Date  . Bilateral flank pain   . Bilateral renal stones    right > left  . Bipolar 1 disorder (HCC)   . Claustrophobia   . Complication of anesthesia    claustrophobic- jittery   . Exercise-induced asthma   . Frequency of urination   . GERD (gastroesophageal reflux disease)    watches diet  . History of acute pancreatitis    04-19-2013;  12/ 2012;  09/ 2011  . History of kidney stones    since age 57  . History of recurrent UTIs   .  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 Katrinka Blazing ([facility]) per note last A1c 9.9 on 11-15-2018  . Peripheral neuropathy   . Renal cyst, right urologist--- dr Berneice Heinrich   non-complex  . Urgency of urination   . Wears glasses     History reviewed. No pertinent family history.  Social History   Socioeconomic History  . Marital status: Married    Spouse name: Not on file  . Number of children: Not on file  . Years of education: Not on file  . Highest education level: Not on file  Occupational History  . Not on file  Tobacco Use  . Smoking status: Current Every Day Smoker    Packs/day: 0.25    Years: 16.00    Pack years: 4.00    Types: Cigarettes  . Smokeless tobacco: Never Used  . Tobacco comment: 11-21-2018  smoked for 15 yrs stopped 2013,  started back  yr ago  Substance and Sexual Activity  . Alcohol use: Not Currently  . Drug use: Never  . Sexual activity: Yes    Birth control/protection: I.U.D.    Comment: Mirena placed 07/ 2017  Other Topics Concern  . Not on file  Social History Narrative  . Not on file   Social Determinants of Health   Financial Resource Strain:   . Difficulty of Paying Living Expenses:   Food Insecurity:   . Worried About Programme researcher, broadcasting/film/video in the Last Year:   . Barista in the Last Year:   Transportation Needs:   . Freight forwarder (Medical):   Marland Kitchen Lack of Transportation (Non-Medical):   Physical Activity:   . Days of Exercise per Week:   . Minutes of Exercise per Session:   Stress:   . Feeling of Stress :   Social Connections:   . Frequency of Communication with Friends and Family:   . Frequency of Social Gatherings with Friends and Family:   . Attends Religious Services:   . Active Member of Clubs or Organizations:   . Attends Banker Meetings:   Marland Kitchen Marital Status:  Intimate Partner Violence:   . Fear of Current or Ex-Partner:   . Emotionally Abused:   Marland Kitchen  Physically Abused:   . Sexually Abused:     I appreciate the opportunity to take part in Adaleena's care. Please do not hesitate to contact me with questions.  Sincerely,   R. Jorene Guest, MD

## 2019-12-03 NOTE — Assessment & Plan Note (Addendum)
Currently well controlled.  Continued efforts towards tobacco cessation has been discussed and encouraged.  Continue Spiriva Respimat 1.25 g, 2 inhalations daily.  Continue Symbicort 160-4.5 g, 2 inhalations via spacer device twice daily, and albuterol HFA, 1 to 2 inhalations every 4-6 hours if needed and 15 minutes prior to exercise.  If subjective and objective measures of pulmonary function remain stable, we will consider stepping down therapy on the next visit.

## 2019-12-03 NOTE — Assessment & Plan Note (Addendum)
Stable.  Continue appropriate allergen avoidance measures and immunotherapy injections per protocol.  Continue azelastine nasal spray as needed.  Nasal saline spray (i.e., Simply Saline) or nasal saline lavage (i.e., NeilMed) is recommended as needed and prior to medicated nasal sprays.  Continue carbinoxamine 4 mg every 8 hours as needed.  Refill prescriptions have been provided.  Medications will be decreased or discontinued as symptom relief from immunotherapy becomes evident.

## 2019-12-03 NOTE — Patient Instructions (Addendum)
Moderate persistent asthma Currently well controlled.  Continued efforts towards tobacco cessation has been discussed and encouraged.  Continue Spiriva Respimat 1.25 g, 2 inhalations daily.  Continue Symbicort 160-4.5 g, 2 inhalations via spacer device twice daily, and albuterol HFA, 1 to 2 inhalations every 4-6 hours if needed and 15 minutes prior to exercise.  If subjective and objective measures of pulmonary function remain stable, we will consider stepping down therapy on the next visit.  Perennial and seasonal allergic rhinitis Stable.  Continue appropriate allergen avoidance measures and immunotherapy injections per protocol.  Continue azelastine nasal spray as needed.  Nasal saline spray (i.e., Simply Saline) or nasal saline lavage (i.e., NeilMed) is recommended as needed and prior to medicated nasal sprays.  Continue carbinoxamine 4 mg every 8 hours as needed.  Refill prescriptions have been provided.  Medications will be decreased or discontinued as symptom relief from immunotherapy becomes evident.  Allergic conjunctivitis  Treatment plan as outlined above for allergic rhinitis.  Continue olopatadine eyedrops, 1 drop per eye daily as needed.  Eye lubricant drops (Natural Tears) as needed.   Return in about 4 months (around 04/03/2020), or if symptoms worsen or fail to improve.

## 2019-12-03 NOTE — Assessment & Plan Note (Signed)
   Treatment plan as outlined above for allergic rhinitis.  Continue olopatadine eyedrops, 1 drop per eye daily as needed.  Eye lubricant drops (Natural Tears) as needed.

## 2019-12-11 ENCOUNTER — Encounter: Payer: Self-pay | Admitting: Allergy and Immunology

## 2019-12-11 ENCOUNTER — Ambulatory Visit (INDEPENDENT_AMBULATORY_CARE_PROVIDER_SITE_OTHER): Payer: Medicaid Other

## 2019-12-11 DIAGNOSIS — J309 Allergic rhinitis, unspecified: Secondary | ICD-10-CM

## 2019-12-31 ENCOUNTER — Ambulatory Visit (INDEPENDENT_AMBULATORY_CARE_PROVIDER_SITE_OTHER): Payer: Medicaid Other

## 2019-12-31 DIAGNOSIS — J309 Allergic rhinitis, unspecified: Secondary | ICD-10-CM

## 2020-01-02 NOTE — Progress Notes (Signed)
VIALS EXP 01-01-21 

## 2020-01-06 DIAGNOSIS — J3089 Other allergic rhinitis: Secondary | ICD-10-CM | POA: Diagnosis not present

## 2020-01-07 DIAGNOSIS — J3081 Allergic rhinitis due to animal (cat) (dog) hair and dander: Secondary | ICD-10-CM

## 2020-01-08 ENCOUNTER — Ambulatory Visit (INDEPENDENT_AMBULATORY_CARE_PROVIDER_SITE_OTHER): Payer: Medicaid Other

## 2020-01-08 DIAGNOSIS — J309 Allergic rhinitis, unspecified: Secondary | ICD-10-CM | POA: Diagnosis not present

## 2020-01-12 ENCOUNTER — Other Ambulatory Visit: Payer: Self-pay | Admitting: Allergy and Immunology

## 2020-01-15 ENCOUNTER — Ambulatory Visit (INDEPENDENT_AMBULATORY_CARE_PROVIDER_SITE_OTHER): Payer: Medicaid Other

## 2020-01-15 DIAGNOSIS — J309 Allergic rhinitis, unspecified: Secondary | ICD-10-CM

## 2020-01-22 ENCOUNTER — Ambulatory Visit (INDEPENDENT_AMBULATORY_CARE_PROVIDER_SITE_OTHER): Payer: Medicaid Other

## 2020-01-22 DIAGNOSIS — J309 Allergic rhinitis, unspecified: Secondary | ICD-10-CM

## 2020-02-05 ENCOUNTER — Encounter: Payer: Self-pay | Admitting: Allergy and Immunology

## 2020-02-05 ENCOUNTER — Ambulatory Visit (INDEPENDENT_AMBULATORY_CARE_PROVIDER_SITE_OTHER): Payer: Medicaid Other

## 2020-02-05 DIAGNOSIS — J309 Allergic rhinitis, unspecified: Secondary | ICD-10-CM | POA: Diagnosis not present

## 2020-02-12 ENCOUNTER — Ambulatory Visit (INDEPENDENT_AMBULATORY_CARE_PROVIDER_SITE_OTHER): Payer: Medicaid Other

## 2020-02-12 DIAGNOSIS — J309 Allergic rhinitis, unspecified: Secondary | ICD-10-CM | POA: Diagnosis not present

## 2020-02-18 ENCOUNTER — Ambulatory Visit (INDEPENDENT_AMBULATORY_CARE_PROVIDER_SITE_OTHER): Payer: Medicaid Other | Admitting: *Deleted

## 2020-02-18 DIAGNOSIS — J309 Allergic rhinitis, unspecified: Secondary | ICD-10-CM

## 2020-02-26 ENCOUNTER — Ambulatory Visit (INDEPENDENT_AMBULATORY_CARE_PROVIDER_SITE_OTHER): Payer: Medicaid Other

## 2020-02-26 DIAGNOSIS — J309 Allergic rhinitis, unspecified: Secondary | ICD-10-CM | POA: Diagnosis not present

## 2020-03-04 ENCOUNTER — Ambulatory Visit (INDEPENDENT_AMBULATORY_CARE_PROVIDER_SITE_OTHER): Payer: Medicaid Other

## 2020-03-04 DIAGNOSIS — J309 Allergic rhinitis, unspecified: Secondary | ICD-10-CM

## 2020-03-11 ENCOUNTER — Ambulatory Visit (INDEPENDENT_AMBULATORY_CARE_PROVIDER_SITE_OTHER): Payer: Medicaid Other

## 2020-03-11 DIAGNOSIS — J309 Allergic rhinitis, unspecified: Secondary | ICD-10-CM | POA: Diagnosis not present

## 2020-03-18 ENCOUNTER — Ambulatory Visit (INDEPENDENT_AMBULATORY_CARE_PROVIDER_SITE_OTHER): Payer: Medicaid Other

## 2020-03-18 DIAGNOSIS — J309 Allergic rhinitis, unspecified: Secondary | ICD-10-CM

## 2020-03-25 ENCOUNTER — Ambulatory Visit (INDEPENDENT_AMBULATORY_CARE_PROVIDER_SITE_OTHER): Payer: Medicaid Other

## 2020-03-25 DIAGNOSIS — J309 Allergic rhinitis, unspecified: Secondary | ICD-10-CM | POA: Diagnosis not present

## 2020-03-30 ENCOUNTER — Ambulatory Visit (INDEPENDENT_AMBULATORY_CARE_PROVIDER_SITE_OTHER): Payer: Medicaid Other

## 2020-03-30 DIAGNOSIS — J309 Allergic rhinitis, unspecified: Secondary | ICD-10-CM | POA: Diagnosis not present

## 2020-04-05 ENCOUNTER — Ambulatory Visit: Payer: Medicaid Other | Admitting: Allergy and Immunology

## 2020-04-08 ENCOUNTER — Ambulatory Visit (INDEPENDENT_AMBULATORY_CARE_PROVIDER_SITE_OTHER): Payer: Medicaid Other

## 2020-04-08 DIAGNOSIS — J309 Allergic rhinitis, unspecified: Secondary | ICD-10-CM

## 2020-04-16 ENCOUNTER — Ambulatory Visit (INDEPENDENT_AMBULATORY_CARE_PROVIDER_SITE_OTHER): Payer: Medicaid Other

## 2020-04-16 DIAGNOSIS — J309 Allergic rhinitis, unspecified: Secondary | ICD-10-CM | POA: Diagnosis not present

## 2020-04-19 ENCOUNTER — Ambulatory Visit (INDEPENDENT_AMBULATORY_CARE_PROVIDER_SITE_OTHER): Payer: Medicaid Other

## 2020-04-19 DIAGNOSIS — J309 Allergic rhinitis, unspecified: Secondary | ICD-10-CM | POA: Diagnosis not present

## 2020-04-26 ENCOUNTER — Ambulatory Visit (INDEPENDENT_AMBULATORY_CARE_PROVIDER_SITE_OTHER): Payer: Medicaid Other | Admitting: *Deleted

## 2020-04-26 DIAGNOSIS — J309 Allergic rhinitis, unspecified: Secondary | ICD-10-CM | POA: Diagnosis not present

## 2020-05-21 ENCOUNTER — Ambulatory Visit (INDEPENDENT_AMBULATORY_CARE_PROVIDER_SITE_OTHER): Payer: Medicaid Other | Admitting: *Deleted

## 2020-05-21 DIAGNOSIS — J309 Allergic rhinitis, unspecified: Secondary | ICD-10-CM | POA: Diagnosis not present

## 2020-06-03 ENCOUNTER — Ambulatory Visit (INDEPENDENT_AMBULATORY_CARE_PROVIDER_SITE_OTHER): Payer: Medicaid Other | Admitting: *Deleted

## 2020-06-03 DIAGNOSIS — J309 Allergic rhinitis, unspecified: Secondary | ICD-10-CM

## 2020-06-09 ENCOUNTER — Ambulatory Visit (INDEPENDENT_AMBULATORY_CARE_PROVIDER_SITE_OTHER): Payer: Medicaid Other

## 2020-06-09 DIAGNOSIS — J309 Allergic rhinitis, unspecified: Secondary | ICD-10-CM

## 2020-08-10 ENCOUNTER — Ambulatory Visit (INDEPENDENT_AMBULATORY_CARE_PROVIDER_SITE_OTHER): Payer: Medicaid Other

## 2020-08-10 DIAGNOSIS — J309 Allergic rhinitis, unspecified: Secondary | ICD-10-CM | POA: Diagnosis not present

## 2020-08-17 ENCOUNTER — Ambulatory Visit (INDEPENDENT_AMBULATORY_CARE_PROVIDER_SITE_OTHER): Payer: Medicaid Other

## 2020-08-17 DIAGNOSIS — J309 Allergic rhinitis, unspecified: Secondary | ICD-10-CM | POA: Diagnosis not present

## 2020-08-24 ENCOUNTER — Ambulatory Visit (INDEPENDENT_AMBULATORY_CARE_PROVIDER_SITE_OTHER): Payer: Medicaid Other

## 2020-08-24 DIAGNOSIS — J309 Allergic rhinitis, unspecified: Secondary | ICD-10-CM | POA: Diagnosis not present

## 2020-08-31 ENCOUNTER — Ambulatory Visit (INDEPENDENT_AMBULATORY_CARE_PROVIDER_SITE_OTHER): Payer: Medicaid Other

## 2020-08-31 DIAGNOSIS — J309 Allergic rhinitis, unspecified: Secondary | ICD-10-CM | POA: Diagnosis not present

## 2020-09-07 ENCOUNTER — Ambulatory Visit (INDEPENDENT_AMBULATORY_CARE_PROVIDER_SITE_OTHER): Payer: Medicaid Other

## 2020-09-07 DIAGNOSIS — J309 Allergic rhinitis, unspecified: Secondary | ICD-10-CM | POA: Diagnosis not present

## 2020-09-27 ENCOUNTER — Other Ambulatory Visit: Payer: Self-pay

## 2020-10-14 ENCOUNTER — Ambulatory Visit (INDEPENDENT_AMBULATORY_CARE_PROVIDER_SITE_OTHER): Payer: Medicaid Other

## 2020-10-14 DIAGNOSIS — J309 Allergic rhinitis, unspecified: Secondary | ICD-10-CM

## 2020-10-19 ENCOUNTER — Ambulatory Visit (INDEPENDENT_AMBULATORY_CARE_PROVIDER_SITE_OTHER): Payer: Medicaid Other

## 2020-10-19 DIAGNOSIS — J309 Allergic rhinitis, unspecified: Secondary | ICD-10-CM

## 2020-10-27 ENCOUNTER — Other Ambulatory Visit: Payer: Self-pay

## 2020-10-27 ENCOUNTER — Ambulatory Visit: Payer: Self-pay | Admitting: *Deleted

## 2020-10-27 ENCOUNTER — Encounter: Payer: Self-pay | Admitting: Allergy

## 2020-10-27 ENCOUNTER — Ambulatory Visit (INDEPENDENT_AMBULATORY_CARE_PROVIDER_SITE_OTHER): Payer: Medicaid Other | Admitting: Allergy

## 2020-10-27 VITALS — BP 100/70 | HR 100 | Temp 98.8°F | Resp 18 | Ht 60.2 in | Wt 156.0 lb

## 2020-10-27 DIAGNOSIS — J3089 Other allergic rhinitis: Secondary | ICD-10-CM

## 2020-10-27 DIAGNOSIS — J309 Allergic rhinitis, unspecified: Secondary | ICD-10-CM | POA: Diagnosis not present

## 2020-10-27 DIAGNOSIS — J4540 Moderate persistent asthma, uncomplicated: Secondary | ICD-10-CM | POA: Diagnosis not present

## 2020-10-27 DIAGNOSIS — H1013 Acute atopic conjunctivitis, bilateral: Secondary | ICD-10-CM | POA: Diagnosis not present

## 2020-10-27 DIAGNOSIS — Z720 Tobacco use: Secondary | ICD-10-CM

## 2020-10-27 MED ORDER — SPIRIVA RESPIMAT 1.25 MCG/ACT IN AERS: 2.0000 | INHALATION_SPRAY | Freq: Every day | RESPIRATORY_TRACT | 5 refills | Status: DC

## 2020-10-27 MED ORDER — AZELASTINE HCL 0.15 % NA SOLN: NASAL | 5 refills | Status: DC

## 2020-10-27 MED ORDER — MONTELUKAST SODIUM 10 MG PO TABS
10.0000 mg | ORAL_TABLET | Freq: Every day | ORAL | 5 refills | Status: DC
Start: 2020-10-27 — End: 2023-02-23

## 2020-10-27 MED ORDER — ALBUTEROL SULFATE HFA 108 (90 BASE) MCG/ACT IN AERS: 1.0000 | INHALATION_SPRAY | RESPIRATORY_TRACT | 1 refills | Status: DC | PRN

## 2020-10-27 MED ORDER — BUDESONIDE-FORMOTEROL FUMARATE 160-4.5 MCG/ACT IN AERO: INHALATION_SPRAY | RESPIRATORY_TRACT | 5 refills | Status: DC

## 2020-10-27 NOTE — Progress Notes (Signed)
Follow-up Note  RE: [name] MRN: [id number] DOB: 1980-12-08 Date of Office Visit: 10/27/2020   History of present illness: [name] is a 40 y.o. female presenting today for follow-up of allergic rhinitis with conjunctivitis and asthma. She was last seen in the office on 12/03/2019 by Dr. Nunzio Cobbs. She states she notices her asthma worsens when the pollen levels are elevated.  She states she coughs all day with the pollen exposure.  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   She has cut down a lot with her smoking.  States she was smoking a pack a day and now a pack can last 3 days.  She reports she will continue to cut back even further.   She is using Symbicort and Spiriva.  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  She then takes symbicort 2 puffs at night.  She uses the Spiriva 2 puffs once a day.  She states she parks in the back of the parking lot at school and has to walk about 100 yards into the school from her car and this does cause her to get winded.  She states she will usually have to use her albuterol prior to the walk and will use her albuterol before she walks back to the car in the afternoon.  She uses carbinoxamine twice a day and it does help with her allergy symptom control.  She uses astelin as needed for nasal drainage which she states is a "life saver".  She has pataday that she uses as needed for itchy/watery eyes and uses it on average couple times a month or when the dog needs to get bathed.  She is on allergen immunotherapy.  She missed a period of time during the fall where she was jobless and did not have a means to get to the office for injection.  She has been able to resume and is tolerating these well at this time.  Currently denying any large local or systemic  reactions..     Review of systems: Review of Systems  Constitutional: Negative.   HENT:       See HPI  Eyes:       See HPI  Respiratory: Positive for cough, shortness of breath and wheezing.   Cardiovascular: Negative.   Gastrointestinal: Negative.   Musculoskeletal: Negative.   Skin: Negative.   Neurological: Negative.     All other systems negative unless noted above in HPI  Past medical/social/surgical/family history have been reviewed and are unchanged unless specifically indicated below.  No changes  Medication List: Current Outpatient Medications  Medication Sig Dispense Refill  . albuterol (VENTOLIN HFA) 108 (90 Base) MCG/ACT inhaler Inhale 1-2 puffs into the lungs every 4 (four) hours as needed for wheezing or shortness of breath. 18 g 1  . ALPRAZolam (XANAX) 0.5 MG tablet Take 0.25-0.5 mg by mouth 2 (two) times daily as needed.    Marland Kitchen amitriptyline (ELAVIL) 50 MG tablet Take 50 mg by mouth at bedtime.    Marland Kitchen asenapine (SAPHRIS) 5 MG SUBL 24 hr tablet Place 5 mg under the tongue 2 (two) times daily. Take 5mg  in morning and 10mg  at night    . Asenapine Maleate 10 MG SUBL DISSOLVE 1 TABLET UNDER THE TONGUE EVERY NIGHT AT BEDTIME    .  Azelastine HCl 0.15 % SOLN USE 1 TO 2 SPRAYS IN EACH NOSTRIL TWICE DAILY AS NEEDED 30 mL 5  . BD PEN NEEDLE NANO 2ND GEN 32G X 4 MM MISC USE WITH INSULIN TWICE DAILY    . budesonide-formoterol (SYMBICORT) 160-4.5 MCG/ACT inhaler Two puffs with spacer twice a day. 1 Inhaler 5  . Carbinoxamine Maleate 4 MG TABS TAKE 1 TABLET(4 MG) BY MOUTH EVERY 8 HOURS AS NEEDED 60 tablet 5  . fexofenadine (ALLEGRA) 180 MG tablet Take 180 mg by mouth 2 (two) times daily.    Marland Kitchen gabapentin (NEURONTIN) 600 MG tablet Take 1,200 mg by mouth at bedtime.     Marland Kitchen ibuprofen (ADVIL,MOTRIN) 200 MG tablet Take 800 mg by mouth every 6 (six) hours as needed for moderate pain.     Marland Kitchen insulin regular human CONCENTRATED (HUMULIN R) 500 UNIT/ML kwikpen Inject into the skin 2 (two)  times daily with a meal. 70 unit am and 30-40 unit lunch.    . lithium carbonate (LITHOBID) 300 MG CR tablet Take 300 mg by mouth at bedtime.    . rosuvastatin (CRESTOR) 5 MG tablet Take 5 mg by mouth 2 (two) times a week.    . Tiotropium Bromide Monohydrate (SPIRIVA RESPIMAT) 1.25 MCG/ACT AERS Inhale 2 puffs into the lungs daily. 4 g 5  . traZODone (DESYREL) 150 MG tablet Take by mouth at bedtime.    . vortioxetine HBr (TRINTELLIX) 20 MG TABS tablet Take 20 mg by mouth daily. Take at bedtime    . benztropine (COGENTIN) 1 MG tablet Take 1 mg by mouth at bedtime.    . cetirizine (ZYRTEC) 10 MG tablet Take 10 mg by mouth as needed for allergies. (Patient not taking: Reported on 10/27/2020)    . fluconazole (DIFLUCAN) 150 MG tablet Take 150 mg by mouth once.    Marland Kitchen JARDIANCE 10 MG TABS tablet Take 10 mg by mouth daily.     No current facility-administered medications for this visit.     Known medication allergies: Allergies  Allergen Reactions  . Penicillins Anaphylaxis  . Depakote Er [Divalproex Sodium Er] Other (See Comments)     Hx recurrent pancreatitis  . Lactose Intolerance (Gi)   . Metformin Diarrhea  . Pristiq [Desvenlafaxine] Hives     Physical examination: Blood pressure 100/70, pulse 100, temperature 98.8 F (37.1 C), temperature source Temporal, resp. rate 18, height 5' 0.2" (1.529 m), weight 156 lb (70.8 kg), SpO2 98 %.  General: Alert, interactive, in no acute distress. HEENT: PERRLA, TMs pearly gray, turbinates minimally edematous without discharge, post-pharynx non erythematous. Neck: Supple without lymphadenopathy. Lungs: Clear to auscultation without wheezing, rhonchi or rales. {no increased work of breathing. CV: Normal S1, S2 without murmurs. Abdomen: Nondistended, nontender. Skin: Warm and dry, without lesions or rashes. Extremities:  No clubbing, cyanosis or edema. Neuro:   Grossly intact.  Diagnositics/Labs:  Spirometry: FEV1: 2.34 L 88%, FVC: 2.81 L 88%,  ratio consistent with Nonobstructive pattern   Assessment and plan:   Moderate persistent asthma Control could be improved  Continue efforts towards tobacco cessation.  You have done a great job thus far with cutting back!  Start Singulair 10mg  daily at bedtime.  If you notice any change in mood/behavior/sleep after starting Singulair then stop this medication and let know.  Symptoms resolve after stopping the medication.    Continue Spiriva Respimat 1.25 g, 2 inhalations daily.  Continue Symbicort 160-4.5 g, 2 inhalations via spacer device twice daily.  Can use Symbicort up  to 4 additional puffs per day if needed for cough, wheezing, shortness of breath or chest tightness.   have access to albuterol inhaler 2 puffs every 4-6 hours as needed for cough/wheeze/shortness of breath/chest tightness.  May use 15-20 minutes prior to activity.   Monitor frequency of use.    Asthma control goals:   Full participation in all desired activities (may need albuterol before activity)  Albuterol use two time or less a week on average (not counting use with activity)  Cough interfering with sleep two time or less a month  Oral steroids no more than once a year  No hospitalizations   Perennial and seasonal allergic rhinitis  Continue appropriate allergen avoidance measures and immunotherapy injections per protocol.  Continue azelastine nasal spray as needed.  Nasal saline spray (i.e., Simply Saline) or nasal saline lavage (i.e., NeilMed) is recommended as needed and prior to medicated nasal sprays.  Continue carbinoxamine 4 mg every 8 hours as needed.  Singulair as above  Allergic conjunctivitis  Treatment plan as outlined above for allergic rhinitis.  Continue olopatadine eyedrops, 1 drop per eye daily as needed.  Eye lubricant drops (Natural Tears) as needed.   Return in about 4 months or sooner if needed  I appreciate the opportunity to take part in Aline care. Please  do not hesitate to contact me with questions.  Sincerely,   Margo Aye, MD Allergy/Immunology [facility]

## 2020-10-27 NOTE — Patient Instructions (Addendum)
Moderate persistent asthma Control could be improved  Continue efforts towards tobacco cessation.  You have done a great job thus far with cutting back!  Start Singulair 10mg  daily at bedtime.  If you notice any change in mood/behavior/sleep after starting Singulair then stop this medication and let know.  Symptoms resolve after stopping the medication.    Continue Spiriva Respimat 1.25 g, 2 inhalations daily.  Continue Symbicort 160-4.5 g, 2 inhalations via spacer device twice daily.  Can use Symbicort up to 4 additional puffs per day if needed for cough, wheezing, shortness of breath or chest tightness.   have access to albuterol inhaler 2 puffs every 4-6 hours as needed for cough/wheeze/shortness of breath/chest tightness.  May use 15-20 minutes prior to activity.   Monitor frequency of use.    Asthma control goals:   Full participation in all desired activities (may need albuterol before activity)  Albuterol use two time or less a week on average (not counting use with activity)  Cough interfering with sleep two time or less a month  Oral steroids no more than once a year  No hospitalizations   Perennial and seasonal allergic rhinitis  Continue appropriate allergen avoidance measures and immunotherapy injections per protocol.  Continue azelastine nasal spray as needed.  Nasal saline spray (i.e., Simply Saline) or nasal saline lavage (i.e., NeilMed) is recommended as needed and prior to medicated nasal sprays.  Continue carbinoxamine 4 mg every 8 hours as needed.  Singulair as above  Allergic conjunctivitis  Treatment plan as outlined above for allergic rhinitis.  Continue olopatadine eyedrops, 1 drop per eye daily as needed.  Eye lubricant drops (Natural Tears) as needed.   Return in about 4 months or sooner if needed

## 2020-11-02 ENCOUNTER — Ambulatory Visit (INDEPENDENT_AMBULATORY_CARE_PROVIDER_SITE_OTHER): Payer: Medicaid Other

## 2020-11-02 DIAGNOSIS — J309 Allergic rhinitis, unspecified: Secondary | ICD-10-CM | POA: Diagnosis not present

## 2020-11-03 NOTE — Progress Notes (Signed)
DILUTE VIALS

## 2020-11-10 ENCOUNTER — Ambulatory Visit (INDEPENDENT_AMBULATORY_CARE_PROVIDER_SITE_OTHER): Payer: Medicaid Other

## 2020-11-10 DIAGNOSIS — J309 Allergic rhinitis, unspecified: Secondary | ICD-10-CM

## 2020-11-16 ENCOUNTER — Ambulatory Visit (INDEPENDENT_AMBULATORY_CARE_PROVIDER_SITE_OTHER): Payer: Medicaid Other

## 2020-11-16 DIAGNOSIS — J309 Allergic rhinitis, unspecified: Secondary | ICD-10-CM | POA: Diagnosis not present

## 2020-12-23 DIAGNOSIS — J3089 Other allergic rhinitis: Secondary | ICD-10-CM

## 2020-12-23 NOTE — Progress Notes (Signed)
VIALS MADE. EXP 12-24-21 

## 2020-12-24 DIAGNOSIS — J3081 Allergic rhinitis due to animal (cat) (dog) hair and dander: Secondary | ICD-10-CM

## 2021-01-14 ENCOUNTER — Other Ambulatory Visit: Payer: Self-pay | Admitting: *Deleted

## 2021-01-14 MED ORDER — ALBUTEROL SULFATE HFA 108 (90 BASE) MCG/ACT IN AERS: 2.0000 | INHALATION_SPRAY | RESPIRATORY_TRACT | 2 refills | Status: DC | PRN

## 2021-01-21 ENCOUNTER — Ambulatory Visit (INDEPENDENT_AMBULATORY_CARE_PROVIDER_SITE_OTHER): Payer: Medicaid Other

## 2021-01-21 DIAGNOSIS — J309 Allergic rhinitis, unspecified: Secondary | ICD-10-CM | POA: Diagnosis not present

## 2021-02-17 ENCOUNTER — Ambulatory Visit (INDEPENDENT_AMBULATORY_CARE_PROVIDER_SITE_OTHER): Payer: Medicaid Other

## 2021-02-17 DIAGNOSIS — J309 Allergic rhinitis, unspecified: Secondary | ICD-10-CM

## 2021-03-02 ENCOUNTER — Ambulatory Visit: Payer: Medicaid Other | Admitting: Allergy

## 2021-05-17 ENCOUNTER — Other Ambulatory Visit: Payer: Self-pay
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
## 2023-02-23 ENCOUNTER — Encounter: Payer: Self-pay | Admitting: Internal Medicine

## 2023-02-23 ENCOUNTER — Ambulatory Visit (INDEPENDENT_AMBULATORY_CARE_PROVIDER_SITE_OTHER): Payer: Medicaid Other | Admitting: Internal Medicine

## 2023-02-23 VITALS — BP 112/72 | HR 68 | Temp 98.7°F | Resp 18 | Ht 63.0 in | Wt 141.0 lb

## 2023-02-23 DIAGNOSIS — J4540 Moderate persistent asthma, uncomplicated: Secondary | ICD-10-CM | POA: Diagnosis not present

## 2023-02-23 DIAGNOSIS — Z720 Tobacco use: Secondary | ICD-10-CM

## 2023-02-23 DIAGNOSIS — H1013 Acute atopic conjunctivitis, bilateral: Secondary | ICD-10-CM | POA: Diagnosis not present

## 2023-02-23 DIAGNOSIS — T7491XA Unspecified adult maltreatment, confirmed, initial encounter: Secondary | ICD-10-CM

## 2023-02-23 DIAGNOSIS — J3089 Other allergic rhinitis: Secondary | ICD-10-CM | POA: Diagnosis not present

## 2023-02-23 DIAGNOSIS — J302 Other seasonal allergic rhinitis: Secondary | ICD-10-CM

## 2023-02-23 MED ORDER — BUDESONIDE-FORMOTEROL FUMARATE 160-4.5 MCG/ACT IN AERO: 2.0000 | INHALATION_SPRAY | Freq: Two times a day (BID) | RESPIRATORY_TRACT | 5 refills | Status: DC

## 2023-02-23 MED ORDER — OLOPATADINE HCL 0.2 % OP SOLN: 1.0000 [drp] | Freq: Every day | OPHTHALMIC | 5 refills | Status: DC | PRN

## 2023-02-23 MED ORDER — SPIRIVA RESPIMAT 1.25 MCG/ACT IN AERS: 2.0000 | INHALATION_SPRAY | Freq: Every day | RESPIRATORY_TRACT | 5 refills | Status: DC

## 2023-02-23 MED ORDER — AZELASTINE-FLUTICASONE 137-50 MCG/ACT NA SUSP: 1.0000 | Freq: Two times a day (BID) | NASAL | 5 refills | Status: DC | PRN

## 2023-02-23 MED ORDER — ALBUTEROL SULFATE (2.5 MG/3ML) 0.083% IN NEBU: 2.5000 mg | INHALATION_SOLUTION | Freq: Four times a day (QID) | RESPIRATORY_TRACT | 2 refills | Status: DC | PRN

## 2023-02-23 MED ORDER — LORATADINE 10 MG PO TABS: 10.0000 mg | ORAL_TABLET | Freq: Every day | ORAL | 5 refills | Status: DC

## 2023-02-23 NOTE — Patient Instructions (Signed)
Chronic Rhinitis: determined to be Seasonal and Perennial Allergic: - allergy testing today: Johnson grass, indoor and outdoor molds, dust mite, cat, dog - Prevention:  - allergen avoidance when possible - consider allergy shots as long term control of your symptoms by teaching your immune system to be more tolerant of your allergy triggers - Symptom control: - Start  dymista  1 spray in each nostril twice a day for nasal congestion/itchy nose - Continue Claritin (Loratadine) 10mg  daily as needed. Can take two doses for breakthrough allergies  Allergic Conjunctivitis:  - Start Allergy Eye drops-pataday eye drops- 1 drop each eye daily as needed  -Avoid eye drops that say red eye relief as they may contain medications that dry out your eyes.  Moderate Persistent Asthma: - your lung testing today looked great - Controller Inhaler: Start Symbicort 160 mcg 2 puffs twice a day; This Should Be Used Everyday - Rinse mouth out after use - Spiriva 2 puffs twice daily - During respiratory illness or asthma flares: Increase Symbicort 160 mcg  3 puffs twice daily  and continue for 2 weeks or until symptoms resolve. - Rescue Inhaler: Albuterol (Proair/Ventolin) 2 puffs . Or 1 vial via nebulizer.  Use  every 4-6 hours as needed for chest tightness, wheezing, or coughing.   Can also use 15 minutes prior to exercise if you have symptoms with activity. - Asthma is not controlled if:  - Symptoms are occurring >2 times a week OR  - >2 times a month nighttime awakenings  - You are requiring systemic steroids (prednisone/steroid injections) more than once per year  - Your require hospitalization for your asthma.  - Please call the clinic to schedule a follow up if these symptoms arise Avoid smoke exposure Stay up-to-date with your annual flu vaccines, COVID vaccines and pneumonia vaccines when indicated.  Follow up : 4-6 weeks, sooner if needed.  It was a pleasure meeting you in clinic today! Thank you  for allowing me to participate in your care.  Tonny Bollman, MD [facility]  Control of Mold Allergen   Mold and fungi can grow on a variety of surfaces provided certain temperature and moisture conditions exist.  Outdoor molds grow on plants, decaying vegetation and soil.  The major outdoor mold, Alternaria and Cladosporium, are found in very high numbers during hot and dry conditions.  Generally, a late Summer - Fall peak is seen for common outdoor fungal spores.  Rain will temporarily lower outdoor mold spore count, but counts rise rapidly when the rainy period ends.  The most important indoor molds are Aspergillus and Penicillium.  Dark, humid and poorly ventilated basements are ideal sites for mold growth.  The next most common sites of mold growth are the bathroom and the kitchen.  Outdoor (Seasonal) Mold Control  Use air conditioning and keep windows closed Avoid exposure to decaying vegetation. Avoid leaf raking. Avoid grain handling. Consider wearing a face mask if working in moldy areas.    Indoor (Perennial) Mold Control   Maintain humidity below 50%. Clean washable surfaces with 5% bleach solution. Remove sources e.g. contaminated carpets.   Control of Dog or Cat Allergen  Avoidance is the best way to manage a dog or cat allergy. If you have a dog or cat and are allergic to dog or cats, consider removing the dog or cat from the home. If you have a dog or cat but don't want to find it a new home, or if your  family wants a pet even though someone in the household is allergic, here are some strategies that may help keep symptoms at bay:  Keep the pet out of your bedroom and restrict it to only a few rooms. Be advised that keeping the dog or cat in only one room will not limit the allergens to that room. Don't pet, hug or kiss the dog or cat; if you do, wash your hands with soap and water. High-efficiency particulate air (HEPA) cleaners run continuously in a  bedroom or living room can reduce allergen levels over time. Regular use of a high-efficiency vacuum cleaner or a central vacuum can reduce allergen levels. Giving your dog or cat a bath at least once a week can reduce airborne allergen. DUST MITE AVOIDANCE MEASURES:  There are three main measures that need and can be taken to avoid house dust mites:  Reduce accumulation of dust in general -reduce furniture, clothing, carpeting, books, stuffed animals, especially in bedroom  Separate yourself from the dust -use pillow and mattress encasements (can be found at stores such as [employer] or online) -avoid direct exposure to air condition flow -use a HEPA filter device, especially in the bedroom; you can also use a HEPA filter vacuum cleaner -wipe dust with a moist towel instead of a dry towel or broom when cleaning  Decrease mites and/or their secretions -wash clothing and linen and stuffed animals at highest temperature possible, at least every 2 weeks -stuffed animals can also be placed in a bag and put in a freezer overnight  Despite the above measures, it is impossible to eliminate dust mites or their allergen completely from your home.  With the above measures the burden of mites in your home can be diminished, with the goal of minimizing your allergic symptoms.  Success will be reached only when implementing and using all means together. Reducing Pollen Exposure  The American Academy of Allergy, Asthma and Immunology suggests the following steps to reduce your exposure to pollen during allergy seasons.    Do not hang sheets or clothing out to dry; pollen may collect on these items. Do not mow lawns or spend time around freshly cut grass; mowing stirs up pollen. Keep windows closed at night.  Keep car windows closed while driving. Minimize morning activities outdoors, a time when pollen counts are usually at their highest. Stay indoors as much as possible when pollen counts  or humidity is high and on windy days when pollen tends to remain in the air longer. Use air conditioning when possible.  Many air conditioners have filters that trap the pollen spores. Use a HEPA room air filter to remove pollen form the indoor air you breathe.

## 2023-02-28 LAB — CBC WITH DIFFERENTIAL/PLATELET
Basophils Absolute: 0 10*3/uL (ref 0.0–0.2)
Basos: 0 %
EOS (ABSOLUTE): 0.1 10*3/uL (ref 0.0–0.4)
Eos: 1 %
Hematocrit: 43.7 % (ref 34.0–46.6)
Hemoglobin: 15 g/dL (ref 11.1–15.9)
Immature Grans (Abs): 0 10*3/uL (ref 0.0–0.1)
Immature Granulocytes: 0 %
Lymphocytes Absolute: 2.1 10*3/uL (ref 0.7–3.1)
Lymphs: 24 %
MCH: 31.1 pg (ref 26.6–33.0)
MCHC: 34.3 g/dL (ref 31.5–35.7)
MCV: 91 fL (ref 79–97)
Monocytes Absolute: 0.5 10*3/uL (ref 0.1–0.9)
Monocytes: 6 %
Neutrophils Absolute: 6 10*3/uL (ref 1.4–7.0)
Neutrophils: 69 %
Platelets: 254 10*3/uL (ref 150–450)
RBC: 4.83 x10E6/uL (ref 3.77–5.28)
RDW: 12.7 % (ref 11.7–15.4)
WBC: 8.7 10*3/uL (ref 3.4–10.8)

## 2023-02-28 LAB — IGE: IgE (Immunoglobulin E), Serum: 45 [IU]/mL (ref 6–495)

## 2023-03-23 ENCOUNTER — Encounter: Payer: Self-pay | Admitting: Internal Medicine

## 2023-03-23 ENCOUNTER — Ambulatory Visit (INDEPENDENT_AMBULATORY_CARE_PROVIDER_SITE_OTHER): Payer: Medicaid Other | Admitting: Internal Medicine

## 2023-03-23 VITALS — BP 122/70 | HR 96 | Temp 98.1°F | Resp 18

## 2023-03-23 DIAGNOSIS — J3089 Other allergic rhinitis: Secondary | ICD-10-CM

## 2023-03-23 DIAGNOSIS — J4540 Moderate persistent asthma, uncomplicated: Secondary | ICD-10-CM

## 2023-03-23 DIAGNOSIS — J302 Other seasonal allergic rhinitis: Secondary | ICD-10-CM

## 2023-03-23 DIAGNOSIS — H1013 Acute atopic conjunctivitis, bilateral: Secondary | ICD-10-CM | POA: Diagnosis not present

## 2023-03-23 MED ORDER — LORATADINE 10 MG PO TABS: 10.0000 mg | ORAL_TABLET | Freq: Every day | ORAL | 5 refills | Status: DC

## 2023-03-23 MED ORDER — OLOPATADINE HCL 0.2 % OP SOLN: 1.0000 [drp] | Freq: Every day | OPHTHALMIC | 5 refills | Status: DC | PRN

## 2023-03-23 MED ORDER — AZELASTINE-FLUTICASONE 137-50 MCG/ACT NA SUSP: 1.0000 | Freq: Two times a day (BID) | NASAL | 5 refills | Status: DC | PRN

## 2023-03-23 MED ORDER — SPIRIVA RESPIMAT 1.25 MCG/ACT IN AERS: 2.0000 | INHALATION_SPRAY | Freq: Every day | RESPIRATORY_TRACT | 5 refills | Status: DC

## 2023-03-23 MED ORDER — BUDESONIDE-FORMOTEROL FUMARATE 160-4.5 MCG/ACT IN AERO: 2.0000 | INHALATION_SPRAY | Freq: Two times a day (BID) | RESPIRATORY_TRACT | 5 refills | Status: DC
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
## 2023-03-23 NOTE — Patient Instructions (Addendum)
Seasonal and Perennial Allergic: - allergy testing Johnson grass, indoor and outdoor molds, dust mite, cat, dog - Prevention:  - allergen avoidance when possible - consider allergy shots as long term control of your symptoms by teaching your immune system to be more tolerant of your allergy triggers - Symptom control: - Start  dymista  1 spray in each nostril twice a day for nasal congestion/itchy nose - Continue Claritin (Loratadine) 10mg  daily as needed. Can take two doses for breakthrough allergies  Allergic Conjunctivitis:  - Start Allergy Eye drops-pataday eye drops- 1 drop each eye daily as needed  -Avoid eye drops that say red eye relief as they may contain medications that dry out your eyes.  Moderate Persistent Asthma: - your lung testing today  looked good - Controller Inhaler: Continue Symbicort 160 mcg 2 puffs twice a day; This Should Be Used Everyday - Rinse mouth out after use - Spiriva 2 puffs twice daily - During respiratory illness or asthma flares: Increase Symbicort 160 mcg  3 puffs twice daily  and continue for 2 weeks or until symptoms resolve. - Rescue Inhaler: Albuterol (Proair/Ventolin) 2 puffs . Or 1 vial via nebulizer.  Use  every 4-6 hours as needed for chest tightness, wheezing, or coughing.   Can also use 15 minutes prior to exercise if you have symptoms with activity. - Asthma is not controlled if:  - Symptoms are occurring >2 times a week OR  - >2 times a month nighttime awakenings  - You are requiring systemic steroids (prednisone/steroid injections) more than once per year  - Your require hospitalization for your asthma.  - Please call the clinic to schedule a follow up if these symptoms arise Avoid smoke exposure Stay up-to-date with your annual flu vaccines, COVID vaccines and pneumonia vaccines when indicated.   We will submit paperwork for Xolair. You will hear from our biologics coordinator Tammy VonCannon. Please answer her phone calls to ensure  a seamless approval process.   Follow up : 12 weeks, sooner if needed.  It was a pleasure seeing you again in clinic today! Thank you for allowing me to participate in your care.  Tonny Bollman, MD [facility]   Control of Mold Allergen   Mold and fungi can grow on a variety of surfaces provided certain temperature and moisture conditions exist.  Outdoor molds grow on plants, decaying vegetation and soil.  The major outdoor mold, Alternaria and Cladosporium, are found in very high numbers during hot and dry conditions.  Generally, a late Summer - Fall peak is seen for common outdoor fungal spores.  Rain will temporarily lower outdoor mold spore count, but counts rise rapidly when the rainy period ends.  The most important indoor molds are Aspergillus and Penicillium.  Dark, humid and poorly ventilated basements are ideal sites for mold growth.  The next most common sites of mold growth are the bathroom and the kitchen.  Outdoor (Seasonal) Mold Control  Use air conditioning and keep windows closed Avoid exposure to decaying vegetation. Avoid leaf raking. Avoid grain handling. Consider wearing a face mask if working in moldy areas.    Indoor (Perennial) Mold Control   Maintain humidity below 50%. Clean washable surfaces with 5% bleach solution. Remove sources e.g. contaminated carpets.   Control of Dog or Cat Allergen  Avoidance is the best way to manage a dog or cat allergy. If you have a dog or cat and are allergic to dog or cats, consider removing the  dog or cat from the home. If you have a dog or cat but don't want to find it a new home, or if your family wants a pet even though someone in the household is allergic, here are some strategies that may help keep symptoms at bay:  Keep the pet out of your bedroom and restrict it to only a few rooms. Be advised that keeping the dog or cat in only one room will not limit the allergens to that room. Don't pet, hug or  kiss the dog or cat; if you do, wash your hands with soap and water. High-efficiency particulate air (HEPA) cleaners run continuously in a bedroom or living room can reduce allergen levels over time. Regular use of a high-efficiency vacuum cleaner or a central vacuum can reduce allergen levels. Giving your dog or cat a bath at least once a week can reduce airborne allergen. DUST MITE AVOIDANCE MEASURES:  There are three main measures that need and can be taken to avoid house dust mites:  Reduce accumulation of dust in general -reduce furniture, clothing, carpeting, books, stuffed animals, especially in bedroom  Separate yourself from the dust -use pillow and mattress encasements (can be found at stores such as [employer], [employer] or online) -avoid direct exposure to air condition flow -use a HEPA filter device, especially in the bedroom; you can also use a HEPA filter vacuum cleaner -wipe dust with a moist towel instead of a dry towel or broom when cleaning  Decrease mites and/or their secretions -wash clothing and linen and stuffed animals at highest temperature possible, at least every 2 weeks -stuffed animals can also be placed in a bag and put in a freezer overnight  Despite the above measures, it is impossible to eliminate dust mites or their allergen completely from your home.  With the above measures the burden of mites in your home can be diminished, with the goal of minimizing your allergic symptoms.  Success will be reached only when implementing and using all means together. Reducing Pollen Exposure  The American Academy of Allergy, Asthma and Immunology suggests the following steps to reduce your exposure to pollen during allergy seasons.    Do not hang sheets or clothing out to dry; pollen may collect on these items. Do not mow lawns or spend time around freshly cut grass; mowing stirs up pollen. Keep windows closed at night.  Keep car windows closed while  driving. Minimize morning activities outdoors, a time when pollen counts are usually at their highest. Stay indoors as much as possible when pollen counts or humidity is high and on windy days when pollen tends to remain in the air longer. Use air conditioning when possible.  Many air conditioners have filters that trap the pollen spores. Use a HEPA room air filter to remove pollen form the indoor air you breathe.

## 2023-03-27 ENCOUNTER — Encounter: Payer: Self-pay | Admitting: *Deleted

## 2023-03-27 ENCOUNTER — Telehealth: Payer: Self-pay | Admitting: *Deleted

## 2023-03-27 NOTE — Telephone Encounter (Signed)
Tried to reach patient but voicemail full. Mychart message sent to patient

## 2023-03-27 NOTE — Telephone Encounter (Signed)
-----   Message from Verlee Monte sent at 03/23/2023 12:54 PM EDT ----- Unknown Foley we submit for Xolair for asthma?  Her IgE was 45, weight in kilos 64.  I did not give a sample today. Thanks you!

## 2023-04-04 NOTE — Telephone Encounter (Signed)
Unable to reach patient by phone 

## 2023-04-11 NOTE — Telephone Encounter (Signed)
Patient has not responded to [facility] [facility]chart message

## 2023-04-18 NOTE — Telephone Encounter (Signed)
Patient never returned calls or responded to [employer]

## 2023-06-15 ENCOUNTER — Ambulatory Visit: Payer: Medicaid Other | Admitting: Internal Medicine

## 2023-07-13 ENCOUNTER — Encounter: Payer: Self-pay | Admitting: Internal Medicine

## 2023-07-13 ENCOUNTER — Ambulatory Visit (INDEPENDENT_AMBULATORY_CARE_PROVIDER_SITE_OTHER): Payer: Medicaid Other | Admitting: Internal Medicine

## 2023-07-13 VITALS — BP 130/80 | HR 121 | Temp 97.2°F | Resp 20

## 2023-07-13 DIAGNOSIS — H1013 Acute atopic conjunctivitis, bilateral: Secondary | ICD-10-CM | POA: Diagnosis not present

## 2023-07-13 DIAGNOSIS — J3089 Other allergic rhinitis: Secondary | ICD-10-CM | POA: Diagnosis not present

## 2023-07-13 DIAGNOSIS — J302 Other seasonal allergic rhinitis: Secondary | ICD-10-CM | POA: Diagnosis not present

## 2023-07-13 DIAGNOSIS — J4540 Moderate persistent asthma, uncomplicated: Secondary | ICD-10-CM | POA: Diagnosis not present

## 2023-07-13 DIAGNOSIS — Z720 Tobacco use: Secondary | ICD-10-CM

## 2023-07-13 MED ORDER — LORATADINE 10 MG PO TABS: 10.0000 mg | ORAL_TABLET | Freq: Two times a day (BID) | ORAL | 5 refills | Status: DC | PRN

## 2023-07-13 MED ORDER — OLOPATADINE HCL 0.2 % OP SOLN: 1.0000 [drp] | Freq: Every day | OPHTHALMIC | 5 refills | Status: AC | PRN

## 2023-07-13 MED ORDER — AZELASTINE-FLUTICASONE 137-50 MCG/ACT NA SUSP: 1.0000 | Freq: Two times a day (BID) | NASAL | 5 refills | Status: DC | PRN

## 2023-07-13 MED ORDER — ALBUTEROL SULFATE HFA 108 (90 BASE) MCG/ACT IN AERS: 2.0000 | INHALATION_SPRAY | RESPIRATORY_TRACT | 1 refills | Status: DC | PRN

## 2023-07-13 MED ORDER — SPIRIVA RESPIMAT 1.25 MCG/ACT IN AERS: 2.0000 | INHALATION_SPRAY | Freq: Every day | RESPIRATORY_TRACT | 5 refills | Status: DC

## 2023-07-13 MED ORDER — BUDESONIDE-FORMOTEROL FUMARATE 160-4.5 MCG/ACT IN AERO: 2.0000 | INHALATION_SPRAY | Freq: Two times a day (BID) | RESPIRATORY_TRACT | 5 refills | Status: DC
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
## 2023-07-13 NOTE — Patient Instructions (Addendum)
 Seasonal and Perennial Allergic: - allergy  testing 2024 positive to Johnson grass, indoor and outdoor molds, dust mite, cat, dog - Prevention:  - allergen avoidance when possible - consider allergy  shots as long term control of your symptoms by teaching your immune system to be more tolerant of your allergy  triggers once asthma controlled - Symptom control: - Continue  dymista   1 spray in each nostril twice a day for nasal congestion/itchy nose - Continue Claritin  (Loratadine ) 10mg  daily as needed. Can take two doses for breakthrough allergies  Allergic Conjunctivitis:  - Continue Allergy  Eye drops-pataday  eye drops- 1 drop each eye daily as needed  -Avoid eye drops that say red eye relief as they may contain medications that dry out your eyes.  Moderate Persistent Asthma: - your lung testing today  looked great but your history is concerning for not controlled asthma - Controller Inhaler: Continue Symbicort  160 mcg 2 puffs twice a day; This Should Be Used Everyday - Rinse mouth out after use - Spiriva  2 puffs twice daily - During respiratory illness or asthma flares: Increase Symbicort  160 mcg  3 puffs twice daily  and continue for 2 weeks or until symptoms resolve. - Rescue Inhaler: Albuterol  (Proair /Ventolin ) 2 puffs . Or 1 vial via nebulizer.  Use  every 4-6 hours as needed for chest tightness, wheezing, or coughing.   Can also use 15 minutes prior to exercise if you have symptoms with activity. - Asthma is not controlled if:  - Symptoms are occurring >2 times a week OR  - >2 times a month nighttime awakenings  - You are requiring systemic steroids (prednisone /steroid injections) more than once per year  - Your require hospitalization for your asthma.  - Please call the clinic to schedule a follow up if these symptoms arise Avoid smoke exposure Stay up-to-date with your annual flu vaccines, COVID vaccines and pneumonia vaccines when indicated.   We will submit paperwork for  Xolair . You will hear from our biologics coordinator Tammy VonCannon. Please answer her phone calls to ensure a seamless approval process.   Follow up : 12 weeks, sooner if needed.  It was a pleasure seeing you again in clinic today! Thank you for allowing me to participate in your care.  Anne Endow, MD [facility] [place]  Control of Mold Allergen   Mold and fungi can grow on a variety of surfaces provided certain temperature and moisture conditions exist.  Outdoor molds grow on plants, decaying vegetation and soil.  The major outdoor mold, Alternaria and Cladosporium, are found in very high numbers during hot and dry conditions.  Generally, a late Summer - Fall peak is seen for common outdoor fungal spores.  Rain will temporarily lower outdoor mold spore count, but counts rise rapidly when the rainy period ends.  The most important indoor molds are Aspergillus and Penicillium.  Dark, humid and poorly ventilated basements are ideal sites for mold growth.  The next most common sites of mold growth are the bathroom and the kitchen.  Outdoor (Seasonal) Mold Control  Use air conditioning and keep windows closed Avoid exposure to decaying vegetation. Avoid leaf raking. Avoid grain handling. Consider wearing a face mask if working in moldy areas.    Indoor (Perennial) Mold Control   Maintain humidity below 50%. Clean washable surfaces with 5% bleach solution. Remove sources e.g. contaminated carpets.   Control of Dog or Cat Allergen  Avoidance is the best way to manage a dog or cat allergy . If you  have a dog or cat and are allergic to dog or cats, consider removing the dog or cat from the home. If you have a dog or cat but don't want to find it a new home, or if your family wants a pet even though someone in the household is allergic, here are some strategies that may help keep symptoms at bay:  Keep the pet out of your bedroom and restrict it to only a few rooms. Be  advised that keeping the dog or cat in only one room will not limit the allergens to that room. Don't pet, hug or kiss the dog or cat; if you do, wash your hands with soap and water. High-efficiency particulate air (HEPA) cleaners run continuously in a bedroom or living room can reduce allergen levels over time. Regular use of a high-efficiency vacuum cleaner or a central vacuum can reduce allergen levels. Giving your dog or cat a bath at least once a week can reduce airborne allergen. DUST MITE AVOIDANCE MEASURES:  There are three main measures that need and can be taken to avoid house dust mites:  Reduce accumulation of dust in general -reduce furniture, clothing, carpeting, books, stuffed animals, especially in bedroom  Separate yourself from the dust -use pillow and mattress encasements (can be found at stores such as [employer], [employer] or online) -avoid direct exposure to air condition flow -use a HEPA filter device, especially in the bedroom; you can also use a HEPA filter vacuum cleaner -wipe dust with a moist towel instead of a dry towel or broom when cleaning  Decrease mites and/or their secretions -wash clothing and linen and stuffed animals at highest temperature possible, at least every 2 weeks -stuffed animals can also be placed in a bag and put in a freezer overnight  Despite the above measures, it is impossible to eliminate dust mites or their allergen completely from your home.  With the above measures the burden of mites in your home can be diminished, with the goal of minimizing your allergic symptoms.  Success will be reached only when implementing and using all means together. Reducing Pollen Exposure  The American Academy of Allergy , Asthma and Immunology suggests the following steps to reduce your exposure to pollen during allergy  seasons.    Do not hang sheets or clothing out to dry; pollen may collect on these items. Do not mow lawns or spend time around freshly  cut grass; mowing stirs up pollen. Keep windows closed at night.  Keep car windows closed while driving. Minimize morning activities outdoors, a time when pollen counts are usually at their highest. Stay indoors as much as possible when pollen counts or humidity is high and on windy days when pollen tends to remain in the air longer. Use air conditioning when possible.  Many air conditioners have filters that trap the pollen spores. Use a HEPA room air filter to remove pollen form the indoor air you breathe.

## 2023-07-31 ENCOUNTER — Telehealth: Payer: Self-pay | Admitting: *Deleted

## 2023-07-31 MED ORDER — OMALIZUMAB 150 MG/ML ~~LOC~~ SOSY
150.0000 mg | PREFILLED_SYRINGE | SUBCUTANEOUS | 11 refills | Status: DC
  Filled 2023-08-02: qty 1, 28d supply, fill #0
  Filled 2024-02-26: qty 1, 28d supply, fill #1
  Filled 2024-04-15: qty 1, 28d supply, fill #2
  Filled 2024-05-12: qty 1, 28d supply, fill #3
  Filled 2024-06-10 – 2024-07-09 (×2): qty 1, 28d supply, fill #4

## 2023-07-31 NOTE — Telephone Encounter (Signed)
Called patient and advised approval still in place will send rx to [facility] and reach out once delivery set to make appt to start

## 2023-07-31 NOTE — Telephone Encounter (Signed)
-----   Message from Verlee Monte sent at 07/13/2023  1:12 PM EST ----- Hi Quill Grinder-this patient was previously approved for Xolair which she would like to start.  Her phone was cut off and we are trying to contact her earlier for financial reasons.  She was able to provide me with 2 phone numbers today.  Goal is to get her on Xolair and then start allergy shots.

## 2023-08-01 ENCOUNTER — Other Ambulatory Visit (HOSPITAL_COMMUNITY): Payer: Self-pay

## 2023-08-02 ENCOUNTER — Other Ambulatory Visit: Payer: Self-pay

## 2023-08-02 ENCOUNTER — Other Ambulatory Visit (HOSPITAL_COMMUNITY): Payer: Self-pay

## 2023-08-02 NOTE — Progress Notes (Signed)
Specialty Pharmacy Initial Fill Coordination Note  [name] is a 43 y.o. female contacted today regarding initial fill of specialty medication(s) Omalizumab Geoffry [place])   Patient requested Courier to Provider Office   Delivery date: 08/07/23   Verified address: [address] [place] [address]   Medication will be filled on 02/03.   Patient is aware of $4.00 copayment.

## 2023-08-02 NOTE — Progress Notes (Signed)
Specialty Pharmacy Initiation Note   [name] is a 43 y.o. female who will be followed by the specialty pharmacy service for RxSp Asthma/COPD    Review of administration, indication, effectiveness, safety, potential side effects, storage/disposable, and missed dose instructions occurred today for patient's specialty medication(s) Omalizumab [name])     Patient/Caregiver did not have any additional questions or concerns.   Patient's therapy is appropriate to: Initiate    Goals Addressed             This Visit's Progress    Reduce disease symptoms including coughing and shortness of breath       Patient is initiating therapy. Patient will maintain adherence and adhere to provider and/or lab appointments         Otto Herb Specialty Pharmacist

## 2023-08-06 ENCOUNTER — Other Ambulatory Visit: Payer: Self-pay

## 2023-08-20 ENCOUNTER — Other Ambulatory Visit: Payer: Self-pay

## 2023-09-18 ENCOUNTER — Other Ambulatory Visit: Payer: Self-pay

## 2023-10-11 ENCOUNTER — Ambulatory Visit: Payer: Medicaid Other | Admitting: Internal Medicine

## 2023-10-17 ENCOUNTER — Ambulatory Visit: Admitting: Internal Medicine

## 2023-11-22 ENCOUNTER — Other Ambulatory Visit (HOSPITAL_COMMUNITY): Payer: Self-pay

## 2023-11-28 ENCOUNTER — Other Ambulatory Visit: Payer: Self-pay

## 2023-11-28 NOTE — Progress Notes (Signed)
 Disenrolling - Xolair  last filled 2.3.25 (114 days ago) for initial dose which is still at [facility]. Multiple no show appointments and no future appointments scheduled.

## 2023-12-06 ENCOUNTER — Other Ambulatory Visit: Payer: Self-pay

## 2023-12-17 ENCOUNTER — Ambulatory Visit (INDEPENDENT_AMBULATORY_CARE_PROVIDER_SITE_OTHER)

## 2023-12-17 DIAGNOSIS — J4540 Moderate persistent asthma, uncomplicated: Secondary | ICD-10-CM

## 2023-12-17 MED ORDER — OMALIZUMAB 150 MG/ML ~~LOC~~ SOSY
150.0000 mg | PREFILLED_SYRINGE | SUBCUTANEOUS | Status: AC
  Administered 2023-12-17 – 2024-07-17 (×5): 150 mg via SUBCUTANEOUS

## 2023-12-17 NOTE — Progress Notes (Signed)
 Immunotherapy   Patient Details  Name: MELODIE ASHWORTH MRN: [id number] Date of Birth: 1981/02/07  12/17/2023  Claudina Cullen started injections for XOLAIR  150 MG  Frequency: Q 28 DAYS Consent signed and patient instructions given. No problem after 1 hour wait.   Atleigh Gruen J Steel Kerney 12/17/2023, 11:14 AM

## 2024-01-14 ENCOUNTER — Other Ambulatory Visit (HOSPITAL_COMMUNITY): Payer: Self-pay

## 2024-01-14 ENCOUNTER — Other Ambulatory Visit: Payer: Self-pay | Admitting: Internal Medicine

## 2024-01-14 ENCOUNTER — Ambulatory Visit

## 2024-01-14 ENCOUNTER — Other Ambulatory Visit: Payer: Self-pay | Admitting: *Deleted

## 2024-01-14 ENCOUNTER — Other Ambulatory Visit: Payer: Self-pay

## 2024-02-14 ENCOUNTER — Ambulatory Visit: Admitting: Internal Medicine

## 2024-02-21 ENCOUNTER — Ambulatory Visit: Admitting: Internal Medicine

## 2024-02-21 ENCOUNTER — Other Ambulatory Visit: Payer: Self-pay

## 2024-02-21 ENCOUNTER — Encounter: Payer: Self-pay | Admitting: Internal Medicine

## 2024-02-21 VITALS — BP 128/78 | HR 100 | Temp 98.6°F | Resp 18 | Ht 61.0 in | Wt 156.9 lb

## 2024-02-21 DIAGNOSIS — H1013 Acute atopic conjunctivitis, bilateral: Secondary | ICD-10-CM | POA: Diagnosis not present

## 2024-02-21 DIAGNOSIS — J302 Other seasonal allergic rhinitis: Secondary | ICD-10-CM

## 2024-02-21 DIAGNOSIS — J3089 Other allergic rhinitis: Secondary | ICD-10-CM | POA: Diagnosis not present

## 2024-02-21 DIAGNOSIS — J4540 Moderate persistent asthma, uncomplicated: Secondary | ICD-10-CM

## 2024-02-21 MED ORDER — BUDESONIDE-FORMOTEROL FUMARATE 160-4.5 MCG/ACT IN AERO: 2.0000 | INHALATION_SPRAY | Freq: Two times a day (BID) | RESPIRATORY_TRACT | 5 refills | Status: DC

## 2024-02-21 MED ORDER — SPIRIVA RESPIMAT 1.25 MCG/ACT IN AERS: 2.0000 | INHALATION_SPRAY | Freq: Every day | RESPIRATORY_TRACT | 5 refills | Status: DC

## 2024-02-21 MED ORDER — ALBUTEROL SULFATE (2.5 MG/3ML) 0.083% IN NEBU: 2.5000 mg | INHALATION_SOLUTION | Freq: Four times a day (QID) | RESPIRATORY_TRACT | 2 refills | Status: AC | PRN

## 2024-02-21 MED ORDER — OMALIZUMAB 150 MG/ML ~~LOC~~ SOSY
150.0000 mg | PREFILLED_SYRINGE | Freq: Once | SUBCUTANEOUS | Status: AC
  Administered 2024-02-21: 150 mg via SUBCUTANEOUS

## 2024-02-21 NOTE — Progress Notes (Signed)
 Immunotherapy   Patient Details  Name: [name] MRN: [id number] Date of Birth: July 25, 1980  02/21/2024  [name] pt was given sample of Xolair  today in office visit.  Frequency: 150 mg every 4 weeks  Epi-Pen:Epi-Pen Available  Consent signed and patient instructions given.   [name] Pouch 02/21/2024, 12:07 PM
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
## 2024-02-21 NOTE — Patient Instructions (Addendum)
 Seasonal and Perennial Allergic: moderately well-controlled - allergy  testing 2024 positive to Johnson grass, indoor and outdoor molds, dust mite, cat, dog - Prevention:  - allergen avoidance when possible - once asthma controlled, consider allergy  shots as long term control of your symptoms by teaching your immune system to be more tolerant of your allergy  triggers once asthma controlled - Symptom control: - Continue Dymista  1 spray in each nostril twice a day for nasal congestion/itchy nose - Continue Claritin  (Loratadine ) 10mg  daily as needed. Can take two doses for breakthrough allergies  Allergic Conjunctivitis: moderatey well-controlled - Continue Allergy  Eye drops-pataday  eye drops- 1 drop each eye daily as needed  -Avoid eye drops that say red eye relief as they may contain medications that dry out your eyes.  Moderate Persistent Asthma:not well - controlled - your lung testing today looked great but your history is concerning for not controlled asthma - Controller Inhaler: Continue Symbicort  160 mcg 2 puffs twice a day; This Should Be Used Everyday - Rinse mouth out after use - Spiriva  2 puffs twice daily - During respiratory illness or asthma flares: Increase Symbicort  160 mcg  3 puffs twice daily  and continue for 2 weeks or until symptoms resolve. - Rescue Inhaler: Albuterol  (Proair /Ventolin ) 2 puffs . Or 1 vial via nebulizer.  Use  every 4-6 hours as needed for chest tightness, wheezing, or coughing.   Can also use 15 minutes prior to exercise if you have symptoms with activity. - Asthma is not controlled if:  - Symptoms are occurring >2 times a week OR  - >2 times a month nighttime awakenings  - You are requiring systemic steroids (prednisone/steroid injections) more than once per year  - Your require hospitalization for your asthma.  - Please call the clinic to schedule a follow up if these symptoms arise Avoid smoke exposure Stay up-to-date with your annual flu vaccines,  COVID vaccines and pneumonia vaccines when indicated.  Sample Xolair  given today. Will send Tammy a message to see about your medication delivery.  Follow up : 6 months, sooner if needed.  It was a pleasure seeing you again in clinic today! Thank you for allowing me to participate in your care.  Anne Endow, MD [facility]  Control of Mold Allergen   Mold and fungi can grow on a variety of surfaces provided certain temperature and moisture conditions exist.  Outdoor molds grow on plants, decaying vegetation and soil.  The major outdoor mold, Alternaria and Cladosporium, are found in very high numbers during hot and dry conditions.  Generally, a late Summer - Fall peak is seen for common outdoor fungal spores.  Rain will temporarily lower outdoor mold spore count, but counts rise rapidly when the rainy period ends.  The most important indoor molds are Aspergillus and Penicillium.  Dark, humid and poorly ventilated basements are ideal sites for mold growth.  The next most common sites of mold growth are the bathroom and the kitchen.  Outdoor (Seasonal) Mold Control  Use air conditioning and keep windows closed Avoid exposure to decaying vegetation. Avoid leaf raking. Avoid grain handling. Consider wearing a face mask if working in moldy areas.    Indoor (Perennial) Mold Control   Maintain humidity below 50%. Clean washable surfaces with 5% bleach solution. Remove sources e.g. contaminated carpets.   Control of Dog or Cat Allergen  Avoidance is the best way to manage a dog or cat allergy . If you have a dog or cat and are  allergic to dog or cats, consider removing the dog or cat from the home. If you have a dog or cat but don't want to find it a new home, or if your family wants a pet even though someone in the household is allergic, here are some strategies that may help keep symptoms at bay:  Keep the pet out of your bedroom and restrict it to only a few rooms.  Be advised that keeping the dog or cat in only one room will not limit the allergens to that room. Don't pet, hug or kiss the dog or cat; if you do, wash your hands with soap and water. High-efficiency particulate air (HEPA) cleaners run continuously in a bedroom or living room can reduce allergen levels over time. Regular use of a high-efficiency vacuum cleaner or a central vacuum can reduce allergen levels. Giving your dog or cat a bath at least once a week can reduce airborne allergen. DUST MITE AVOIDANCE MEASURES:  There are three main measures that need and can be taken to avoid house dust mites:  Reduce accumulation of dust in general -reduce furniture, clothing, carpeting, books, stuffed animals, especially in bedroom  Separate yourself from the dust -use pillow and mattress encasements (can be found at stores such as [employer] or online) -avoid direct exposure to air condition flow -use a HEPA filter device, especially in the bedroom; you can also use a HEPA filter vacuum cleaner -wipe dust with a moist towel instead of a dry towel or broom when cleaning  Decrease mites and/or their secretions -wash clothing and linen and stuffed animals at highest temperature possible, at least every 2 weeks -stuffed animals can also be placed in a bag and put in a freezer overnight  Despite the above measures, it is impossible to eliminate dust mites or their allergen completely from your home.  With the above measures the burden of mites in your home can be diminished, with the goal of minimizing your allergic symptoms.  Success will be reached only when implementing and using all means together. Reducing Pollen Exposure  The American Academy of Allergy , Asthma and Immunology suggests the following steps to reduce your exposure to pollen during allergy  seasons.    Do not hang sheets or clothing out to dry; pollen may collect on these items. Do not mow lawns or spend time around  freshly cut grass; mowing stirs up pollen. Keep windows closed at night.  Keep car windows closed while driving. Minimize morning activities outdoors, a time when pollen counts are usually at their highest. Stay indoors as much as possible when pollen counts or humidity is high and on windy days when pollen tends to remain in the air longer. Use air conditioning when possible.  Many air conditioners have filters that trap the pollen spores. Use a HEPA room air filter to remove pollen form the indoor air you breathe.

## 2024-02-25 ENCOUNTER — Telehealth: Payer: Self-pay | Admitting: *Deleted

## 2024-02-25 NOTE — Telephone Encounter (Signed)
-----   Message from Rocky LOISE Endow sent at 02/21/2024  1:20 PM EDT ----- [name]-patient states she has not received her injections and we do not have any in clinic.  She is confused about where her medication should be going. I gave her a sample today.

## 2024-02-25 NOTE — Telephone Encounter (Signed)
 Called patient and advised the reason [name] is not sending Rx is due to multiple calls and messages when trying to get her started so I gave her number and advised to call since they wont reach out to her. I had told her this before

## 2024-02-26 ENCOUNTER — Other Ambulatory Visit: Payer: Self-pay

## 2024-02-26 NOTE — Progress Notes (Signed)
 Specialty Pharmacy Initial Fill Coordination Note  [name] is a 43 y.o. female contacted today regarding initial fill of specialty medication(s) Omalizumab  (XOLAIR )   Patient requested Courier to Provider Office   Delivery date: 02/28/24   Verified address: [address] [place] [address]   Medication will be filled on 02/27/24.   Patient is aware of $4 copayment.    Informed husband of last 4 of cc on file. He will double check when he gets home and call us  if he needs to provide a new card. Advised we can also call him in case of decline.

## 2024-02-26 NOTE — Progress Notes (Signed)
 Specialty Pharmacy Initiation Note   [name] is a 43 y.o. female who will be followed by the specialty pharmacy service for RxSp Asthma/COPD    Review of administration, indication, effectiveness, safety, potential side effects, storage/disposable, and missed dose instructions occurred today for patient's specialty medication(s) Omalizumab  (XOLAIR )     Patient/Caregiver did not have any additional questions or concerns.   Patient's therapy is appropriate to: Initiate    Goals Addressed             This Visit's Progress    Reduce disease symptoms including coughing and shortness of breath       Patient is re-initiating therapy. Patient will maintain adherence and adhere to provider and/or lab appointments. Patient had stopped therapy previously and restarted in office with sample 8/21.          [name] M Sigurd Pugh Specialty Pharmacist

## 2024-03-06 ENCOUNTER — Other Ambulatory Visit: Payer: Self-pay

## 2024-03-11 ENCOUNTER — Other Ambulatory Visit: Payer: Self-pay

## 2024-03-25 ENCOUNTER — Ambulatory Visit (INDEPENDENT_AMBULATORY_CARE_PROVIDER_SITE_OTHER)

## 2024-03-25 DIAGNOSIS — J4540 Moderate persistent asthma, uncomplicated: Secondary | ICD-10-CM

## 2024-04-01 ENCOUNTER — Other Ambulatory Visit: Payer: Self-pay

## 2024-04-03 ENCOUNTER — Other Ambulatory Visit: Payer: Self-pay

## 2024-04-09 ENCOUNTER — Other Ambulatory Visit: Payer: Self-pay | Admitting: Internal Medicine

## 2024-04-15 ENCOUNTER — Other Ambulatory Visit: Payer: Self-pay | Admitting: Pharmacy Technician

## 2024-04-15 ENCOUNTER — Other Ambulatory Visit: Payer: Self-pay

## 2024-04-15 NOTE — Progress Notes (Signed)
 Specialty Pharmacy Refill Coordination Note  [name] is a 43 y.o. female assessed today regarding refills of clinic administered specialty medication(s) Omalizumab  (XOLAIR )   Clinic requested Courier to Provider Office   Delivery date: 04/16/24   Verified address: [facility] [address] [place]   Medication will be filled on 04/15/24.

## 2024-04-22 ENCOUNTER — Ambulatory Visit

## 2024-04-22 ENCOUNTER — Ambulatory Visit (INDEPENDENT_AMBULATORY_CARE_PROVIDER_SITE_OTHER)

## 2024-04-22 DIAGNOSIS — J4540 Moderate persistent asthma, uncomplicated: Secondary | ICD-10-CM | POA: Diagnosis not present

## 2024-05-12 ENCOUNTER — Other Ambulatory Visit: Payer: Self-pay | Admitting: Pharmacy Technician

## 2024-05-12 ENCOUNTER — Other Ambulatory Visit: Payer: Self-pay

## 2024-05-12 NOTE — Progress Notes (Signed)
 Specialty Pharmacy Refill Coordination Note  [name] is a 43 y.o. female assessed today regarding refills of clinic administered specialty medication(s) Omalizumab  (XOLAIR )   Clinic requested Courier to Provider Office   Delivery date: 05/15/24   Verified address: [facility] [address] [place]   Medication will be filled on: 05/14/24

## 2024-05-20 ENCOUNTER — Ambulatory Visit

## 2024-05-21 ENCOUNTER — Other Ambulatory Visit: Payer: Self-pay

## 2024-05-21 ENCOUNTER — Ambulatory Visit

## 2024-05-22 ENCOUNTER — Ambulatory Visit (HOSPITAL_BASED_OUTPATIENT_CLINIC_OR_DEPARTMENT_OTHER)
Admission: RE | Admit: 2024-05-22 | Discharge: 2024-05-22 | Disposition: A | Discharge status: Home / Self Care | Source: Ambulatory Visit | Attending: Family | Admitting: Family

## 2024-05-22 ENCOUNTER — Ambulatory Visit: Admitting: Family

## 2024-05-22 ENCOUNTER — Other Ambulatory Visit: Payer: Self-pay

## 2024-05-22 ENCOUNTER — Ambulatory Visit: Payer: Self-pay | Admitting: Family

## 2024-05-22 ENCOUNTER — Ambulatory Visit

## 2024-05-22 ENCOUNTER — Encounter: Payer: Self-pay | Admitting: Family

## 2024-05-22 VITALS — BP 128/76 | HR 95 | Temp 98.2°F | Resp 20

## 2024-05-22 DIAGNOSIS — J4541 Moderate persistent asthma with (acute) exacerbation: Secondary | ICD-10-CM

## 2024-05-22 DIAGNOSIS — H1013 Acute atopic conjunctivitis, bilateral: Secondary | ICD-10-CM | POA: Diagnosis not present

## 2024-05-22 DIAGNOSIS — R051 Acute cough: Secondary | ICD-10-CM | POA: Insufficient documentation

## 2024-05-22 DIAGNOSIS — J3089 Other allergic rhinitis: Secondary | ICD-10-CM | POA: Diagnosis not present

## 2024-05-22 DIAGNOSIS — J302 Other seasonal allergic rhinitis: Secondary | ICD-10-CM

## 2024-05-22 MED ORDER — PREDNISONE 10 MG PO TABS: ORAL_TABLET | ORAL | 0 refills | Status: AC

## 2024-05-22 MED ORDER — DOXYCYCLINE HYCLATE 100 MG PO TABS: 100.0000 mg | ORAL_TABLET | Freq: Two times a day (BID) | ORAL | 0 refills | Status: AC

## 2024-05-22 NOTE — Progress Notes (Signed)
 [address] [place] [address] Dept: [phone]  FOLLOW UP NOTE  Patient ID: [name], female    DOB: 1980/11/07  Age: 43 y.o. MRN: [id number] Date of Office Visit: 05/22/2024  Assessment  Chief Complaint: Other (Same day, asthma used nebulizer this am and 2-3 times last night. Cough x 1 month)  HPI [name] is a 43 year old female who presents today for an acute visit of cough.  She was last seen on February 21, 2024 by Dr. Marinda for seasonal and perennial allergic rhinitis, moderate persistent asthma without complication, and allergic conjunctivitis of both eyes.  She denies any new diagnosis or surgery since her last office visit.  She reports that her symptoms started at least 4 weeks ago with head cold and sore throat that went down to her chest.  During that time she also had diarrhea, vomiting, and congestion in her chest.  It started going away, but for the past 1-1/2 weeks it has been worse.  Her cough is nonproductive she also has wheezing, tightness in her chest, shortness of breath, and nocturnal awakenings due to breathing problems.  She is waking up like she is gasping for breath.  She has been using her albuterol  via her nebulizer for the past few days and it helps a little bit more than the albuterol  inhaler.  She does not cough for about an hour after.  It also helps with her shortness of breath, wheezing, and tightness in her chest and lets her sleep.  She does continue to take Symbicort  160/4.5 mcg 2 puffs twice a day and has not increased her Symbicort  to 3 puffs twice a day as instructed to do during respiratory illnesses/asthma flares.  She does receive Xolair  injections per protocol, but she was supposed to get this today, but was told there was a delay and that our office will call when we get it in.  Since her last office visit she has not required any systemic steroids or made any trips to the emergency room or urgent care due to breathing problems.   She reports that she has had a fever a couple of times, but she has lost her thermometer.  She reports that she could tell that she had a fever because how she felt and that was probably around  100 degrees.  Her last fever was approximately 2 weeks ago.  She has also been taking Mucinex  D and [employer] cold and flu.  She also reports light yellow-green rhinorrhea when she can get it out for the past 4 to 5 days.  She also has constant nasal congestion.  She denies postnasal drip.  Her ear did stop hurting 2-1/2 weeks ago.  Since her last office visit she has not been treated for any sinus infections.  She does not use any nasal sprays because she does not feel like they help she does take an antihistamine that the pharmacist says is loratadine .   Drug Allergies:  Allergies  Allergen Reactions   Penicillins Anaphylaxis   Depakote  Er [Divalproex  Sodium Er] Other (See Comments)     Hx recurrent pancreatitis   Lactose Intolerance (Gi)    Metformin  Diarrhea   Pristiq [Desvenlafaxine] Hives    Review of Systems: Negative except as per HPI   Physical Exam: BP 128/76   Pulse 95   Temp 98.2 F (36.8 C) (Temporal)   Resp 20   SpO2 97%    Physical Exam Constitutional:  Appearance: Normal appearance.  HENT:     Head: Normocephalic and atraumatic.     Comments: Pharynx normal, eyes normal, ears normal, nose: Bilateral lower turbinates mildly edematous with no drainage noted    Right Ear: Tympanic membrane, ear canal and external ear normal.     Left Ear: Tympanic membrane, ear canal and external ear normal.     Mouth/Throat:     Mouth: Mucous membranes are moist.     Pharynx: Oropharynx is clear.  Eyes:     Conjunctiva/sclera: Conjunctivae normal.  Cardiovascular:     Rate and Rhythm: Regular rhythm.     Heart sounds: Normal heart sounds.  Pulmonary:     Effort: Pulmonary effort is normal.     Breath sounds: Normal breath sounds.     Comments: Lungs clear to  auscultation Musculoskeletal:     Cervical back: Neck supple.  Skin:    General: Skin is warm.  Neurological:     Mental Status: She is alert and oriented to person, place, and time.  Psychiatric:        Mood and Affect: Mood normal.        Behavior: Behavior normal.        Thought Content: Thought content normal.        Judgment: Judgment normal.     Diagnostics: FVC 3.04 L (65%), FEV1 2.40 L (65%), FEV1/FVC 0.79.  Spirometry indicates possible restrictive defect.  Assessment and Plan: 1. Acute cough   2. Moderate persistent asthma with (acute) exacerbation   3. Seasonal and perennial allergic rhinitis   4. Allergic conjunctivitis of both eyes     Meds ordered this encounter  Medications   predniSONE  (DELTASONE ) 10 MG tablet    Sig: Take 2 tablets twice a day for 3 days, then on the fourth day take 2 tablets in the morning, and on the fifth day take 1 tablet and stop    Dispense:  15 tablet    Refill:  0   doxycycline  (VIBRA -TABS) 100 MG tablet    Sig: Take 1 tablet (100 mg total) by mouth 2 (two) times daily.    Dispense:  14 tablet    Refill:  0    Patient Instructions  Seasonal and Perennial Allergic: not well-controlled - allergy  testing 2024 positive to Johnson grass, indoor and outdoor molds, dust mite, cat, dog - Prevention:  - allergen avoidance when possible - once asthma controlled, consider allergy  shots as long term control of your symptoms by teaching your immune system to be more tolerant of your allergy  triggers once asthma controlled - Symptom control: - May use Dymista  1 spray in each nostril twice a day for nasal congestion/itchy nose - Continue Claritin  (Loratadine ) 10mg  daily as needed. Can take two doses for breakthrough allergies  Allergic Conjunctivitis: moderatey well-controlled - Continue Allergy  Eye drops-pataday  eye drops- 1 drop each eye daily as needed  -Avoid eye drops that say red eye relief as they may contain medications that dry out  your eyes.  Moderate Persistent Asthma:with acute exacerbation - Start prednisone  10 mg taking 2 tablets twice a day for 3 days, then on the fourth day take 2 tablets in the morning, and on the fifth day take 1 tablet and stop.  Make sure to check your blood sugar while taking the prednisone  -I will send in an order for a stat chest x-ray.  We will call you with results.  This will help better guide me as to which antibiotic I prescribe. -  Please let me know if you are not getting any better.  If your symptoms worsen please go to the emergency room. Webster, our injection room nurse, reports that she will call you when the Xolair  is available.  Please call our office if you do not hear from her soon. - Controller Inhaler: Continue Symbicort  160 mcg 2 puffs twice a day; This Should Be Used Everyday - Rinse mouth out after use - Spiriva  2 puffs twice daily - During respiratory illness or asthma flares: Increase Symbicort  160 mcg  3 puffs twice daily  and continue for 2 weeks or until symptoms resolve. - Rescue Inhaler: Albuterol  (Proair /Ventolin ) 2 puffs . Or 1 vial via nebulizer.  Use  every 4-6 hours as needed for chest tightness, wheezing, or coughing.   Can also use 15 minutes prior to exercise if you have symptoms with activity. - Asthma is not controlled if:  - Symptoms are occurring >2 times a week OR  - >2 times a month nighttime awakenings  - You are requiring systemic steroids (prednisone /steroid injections) more than once per year  - Your require hospitalization for your asthma.  - Please call the clinic to schedule a follow up if these symptoms arise Avoid smoke exposure Stay up-to-date with your annual flu vaccines, COVID vaccines and pneumonia vaccines when indicated.    Follow up : 2 months, sooner if needed.  It was a pleasure seeing you again in clinic today! Thank you for allowing me to participate in your care.    Control of Mold Allergen   Mold and fungi can grow  on a variety of surfaces provided certain temperature and moisture conditions exist.  Outdoor molds grow on plants, decaying vegetation and soil.  The major outdoor mold, Alternaria and Cladosporium, are found in very high numbers during hot and dry conditions.  Generally, a late Summer - Fall peak is seen for common outdoor fungal spores.  Rain will temporarily lower outdoor mold spore count, but counts rise rapidly when the rainy period ends.  The most important indoor molds are Aspergillus and Penicillium.  Dark, humid and poorly ventilated basements are ideal sites for mold growth.  The next most common sites of mold growth are the bathroom and the kitchen.  Outdoor (Seasonal) Mold Control  Use air conditioning and keep windows closed Avoid exposure to decaying vegetation. Avoid leaf raking. Avoid grain handling. Consider wearing a face mask if working in moldy areas.    Indoor (Perennial) Mold Control   Maintain humidity below 50%. Clean washable surfaces with 5% bleach solution. Remove sources e.g. contaminated carpets.   Control of Dog or Cat Allergen  Avoidance is the best way to manage a dog or cat allergy . If you have a dog or cat and are allergic to dog or cats, consider removing the dog or cat from the home. If you have a dog or cat but don't want to find it a new home, or if your family wants a pet even though someone in the household is allergic, here are some strategies that may help keep symptoms at bay:  Keep the pet out of your bedroom and restrict it to only a few rooms. Be advised that keeping the dog or cat in only one room will not limit the allergens to that room. Don't pet, hug or kiss the dog or cat; if you do, wash your hands with soap and water. High-efficiency particulate air (HEPA) cleaners run continuously in a bedroom or living room can  reduce allergen levels over time. Regular use of a high-efficiency vacuum cleaner or a central vacuum can reduce allergen  levels. Giving your dog or cat a bath at least once a week can reduce airborne allergen. DUST MITE AVOIDANCE MEASURES:  There are three main measures that need and can be taken to avoid house dust mites:  Reduce accumulation of dust in general -reduce furniture, clothing, carpeting, books, stuffed animals, especially in bedroom  Separate yourself from the dust -use pillow and mattress encasements (can be found at stores such as [employer] or online) -avoid direct exposure to air condition flow -use a HEPA filter device, especially in the bedroom; you can also use a HEPA filter vacuum cleaner -wipe dust with a moist towel instead of a dry towel or broom when cleaning  Decrease mites and/or their secretions -wash clothing and linen and stuffed animals at highest temperature possible, at least every 2 weeks -stuffed animals can also be placed in a bag and put in a freezer overnight  Despite the above measures, it is impossible to eliminate dust mites or their allergen completely from your home.  With the above measures the burden of mites in your home can be diminished, with the goal of minimizing your allergic symptoms.  Success will be reached only when implementing and using all means together. Reducing Pollen Exposure  The American Academy of Allergy , Asthma and Immunology suggests the following steps to reduce your exposure to pollen during allergy  seasons.    Do not hang sheets or clothing out to dry; pollen may collect on these items. Do not mow lawns or spend time around freshly cut grass; mowing stirs up pollen. Keep windows closed at night.  Keep car windows closed while driving. Minimize morning activities outdoors, a time when pollen counts are usually at their highest. Stay indoors as much as possible when pollen counts or humidity is high and on windy days when pollen tends to remain in the air longer. Use air conditioning when possible.  Many air conditioners have  filters that trap the pollen spores. Use a HEPA room air filter to remove pollen form the indoor air you breathe.  Return in about 2 months (around 07/22/2024), or if symptoms worsen or fail to improve.    Thank you for the opportunity to care for this patient.  Please do not hesitate to contact me with questions.  Wanda Craze, FNP [facility]  

## 2024-05-22 NOTE — Progress Notes (Signed)
 Please let [name] know that her chest x-ray is normal. This is good news. I will send in doxycycline  100 mg twice a day for 7 days to treat for a sinus infection. Prescription sent . Let me know if she does not get better.

## 2024-05-22 NOTE — Progress Notes (Signed)
 Patient informed, she will pick up prescription, no questions or concerns verbalized. Best number [phone]

## 2024-05-22 NOTE — Patient Instructions (Addendum)
 Seasonal and Perennial Allergic: not well-controlled - allergy  testing 2024 positive to Johnson grass, indoor and outdoor molds, dust mite, cat, dog - Prevention:  - allergen avoidance when possible - once asthma controlled, consider allergy  shots as long term control of your symptoms by teaching your immune system to be more tolerant of your allergy  triggers once asthma controlled - Symptom control: - May use Dymista  1 spray in each nostril twice a day for nasal congestion/itchy nose - Continue Claritin  (Loratadine ) 10mg  daily as needed. Can take two doses for breakthrough allergies  Allergic Conjunctivitis: moderatey well-controlled - Continue Allergy  Eye drops-pataday  eye drops- 1 drop each eye daily as needed  -Avoid eye drops that say red eye relief as they may contain medications that dry out your eyes.  Moderate Persistent Asthma:with acute exacerbation - Start prednisone 10 mg taking 2 tablets twice a day for 3 days, then on the fourth day take 2 tablets in the morning, and on the fifth day take 1 tablet and stop.  Make sure to check your blood sugar while taking the prednisone -I will send in an order for a stat chest x-ray.  We will call you with results.  This will help better guide me as to which antibiotic I prescribe. -Please let me know if you are not getting any better.  If your symptoms worsen please go to the emergency room. Webster, our injection room nurse, reports that she will call you when the Xolair  is available.  Please call our office if you do not hear from her soon. - Controller Inhaler: Continue Symbicort  160 mcg 2 puffs twice a day; This Should Be Used Everyday - Rinse mouth out after use - Spiriva  2 puffs twice daily - During respiratory illness or asthma flares: Increase Symbicort  160 mcg  3 puffs twice daily  and continue for 2 weeks or until symptoms resolve. - Rescue Inhaler: Albuterol  (Proair /Ventolin ) 2 puffs . Or 1 vial via nebulizer.  Use  every 4-6  hours as needed for chest tightness, wheezing, or coughing.   Can also use 15 minutes prior to exercise if you have symptoms with activity. - Asthma is not controlled if:  - Symptoms are occurring >2 times a week OR  - >2 times a month nighttime awakenings  - You are requiring systemic steroids (prednisone/steroid injections) more than once per year  - Your require hospitalization for your asthma.  - Please call the clinic to schedule a follow up if these symptoms arise Avoid smoke exposure Stay up-to-date with your annual flu vaccines, COVID vaccines and pneumonia vaccines when indicated.    Follow up : 2 months, sooner if needed.  It was a pleasure seeing you again in clinic today! Thank you for allowing me to participate in your care.    Control of Mold Allergen   Mold and fungi can grow on a variety of surfaces provided certain temperature and moisture conditions exist.  Outdoor molds grow on plants, decaying vegetation and soil.  The major outdoor mold, Alternaria and Cladosporium, are found in very high numbers during hot and dry conditions.  Generally, a late Summer - Fall peak is seen for common outdoor fungal spores.  Rain will temporarily lower outdoor mold spore count, but counts rise rapidly when the rainy period ends.  The most important indoor molds are Aspergillus and Penicillium.  Dark, humid and poorly ventilated basements are ideal sites for mold growth.  The next most common sites of mold growth are the bathroom and  the kitchen.  Outdoor (Seasonal) Mold Control  Use air conditioning and keep windows closed Avoid exposure to decaying vegetation. Avoid leaf raking. Avoid grain handling. Consider wearing a face mask if working in moldy areas.    Indoor (Perennial) Mold Control   Maintain humidity below 50%. Clean washable surfaces with 5% bleach solution. Remove sources e.g. contaminated carpets.   Control of Dog or Cat Allergen  Avoidance is the best way to  manage a dog or cat allergy . If you have a dog or cat and are allergic to dog or cats, consider removing the dog or cat from the home. If you have a dog or cat but don't want to find it a new home, or if your family wants a pet even though someone in the household is allergic, here are some strategies that may help keep symptoms at bay:  Keep the pet out of your bedroom and restrict it to only a few rooms. Be advised that keeping the dog or cat in only one room will not limit the allergens to that room. Don't pet, hug or kiss the dog or cat; if you do, wash your hands with soap and water. High-efficiency particulate air (HEPA) cleaners run continuously in a bedroom or living room can reduce allergen levels over time. Regular use of a high-efficiency vacuum cleaner or a central vacuum can reduce allergen levels. Giving your dog or cat a bath at least once a week can reduce airborne allergen. DUST MITE AVOIDANCE MEASURES:  There are three main measures that need and can be taken to avoid house dust mites:  Reduce accumulation of dust in general -reduce furniture, clothing, carpeting, books, stuffed animals, especially in bedroom  Separate yourself from the dust -use pillow and mattress encasements (can be found at stores such as [employer] or online) -avoid direct exposure to air condition flow -use a HEPA filter device, especially in the bedroom; you can also use a HEPA filter vacuum cleaner -wipe dust with a moist towel instead of a dry towel or broom when cleaning  Decrease mites and/or their secretions -wash clothing and linen and stuffed animals at highest temperature possible, at least every 2 weeks -stuffed animals can also be placed in a bag and put in a freezer overnight  Despite the above measures, it is impossible to eliminate dust mites or their allergen completely from your home.  With the above measures the burden of mites in your home can be diminished, with the goal  of minimizing your allergic symptoms.  Success will be reached only when implementing and using all means together. Reducing Pollen Exposure  The American Academy of Allergy , Asthma and Immunology suggests the following steps to reduce your exposure to pollen during allergy  seasons.    Do not hang sheets or clothing out to dry; pollen may collect on these items. Do not mow lawns or spend time around freshly cut grass; mowing stirs up pollen. Keep windows closed at night.  Keep car windows closed while driving. Minimize morning activities outdoors, a time when pollen counts are usually at their highest. Stay indoors as much as possible when pollen counts or humidity is high and on windy days when pollen tends to remain in the air longer. Use air conditioning when possible.  Many air conditioners have filters that trap the pollen spores. Use a HEPA room air filter to remove pollen form the indoor air you breathe.

## 2024-06-10 ENCOUNTER — Other Ambulatory Visit: Payer: Self-pay

## 2024-06-13 ENCOUNTER — Other Ambulatory Visit: Payer: Self-pay

## 2024-06-19 ENCOUNTER — Ambulatory Visit

## 2024-06-19 DIAGNOSIS — J4540 Moderate persistent asthma, uncomplicated: Secondary | ICD-10-CM | POA: Diagnosis not present

## 2024-06-19 DIAGNOSIS — J4541 Moderate persistent asthma with (acute) exacerbation: Secondary | ICD-10-CM

## 2024-07-04 ENCOUNTER — Other Ambulatory Visit: Payer: Self-pay

## 2024-07-07 ENCOUNTER — Other Ambulatory Visit: Payer: Self-pay

## 2024-07-08 ENCOUNTER — Other Ambulatory Visit: Payer: Self-pay | Admitting: Internal Medicine

## 2024-07-09 ENCOUNTER — Other Ambulatory Visit: Payer: Self-pay

## 2024-07-09 NOTE — Progress Notes (Signed)
 Specialty Pharmacy Refill Coordination Note  [name] is a 44 y.o. female contacted today regarding refills of specialty medication(s) Omalizumab  (XOLAIR )   Patient requested Courier to Provider Office   Delivery date: 07/14/24   Verified address: [facility] [address] [place]   Medication will be filled on: 07/11/24  Copay:$4 Appointment: 01.15.26

## 2024-07-11 ENCOUNTER — Other Ambulatory Visit: Payer: Self-pay

## 2024-07-17 ENCOUNTER — Ambulatory Visit

## 2024-07-17 DIAGNOSIS — J4540 Moderate persistent asthma, uncomplicated: Secondary | ICD-10-CM | POA: Diagnosis not present

## 2024-07-17 DIAGNOSIS — J4541 Moderate persistent asthma with (acute) exacerbation: Secondary | ICD-10-CM

## 2024-07-23 NOTE — Patient Instructions (Incomplete)
 Seasonal and Perennial Allergic:  - allergy  testing 2024 positive to Johnson grass, indoor and outdoor molds, dust mite, cat, dog - Prevention:  - allergen avoidance when possible - once asthma controlled, consider allergy  shots as long term control of your symptoms by teaching your immune system to be more tolerant of your allergy  triggers once asthma controlled - Symptom control: - May use Dymista  1 spray in each nostril twice a day for nasal congestion/itchy nose - Continue Claritin  (Loratadine ) 10mg  daily as needed. Can take two doses for breakthrough allergies  Allergic Conjunctivitis: - Continue Allergy  Eye drops-pataday  eye drops- 1 drop each eye daily as needed  -Avoid eye drops that say red eye relief as they may contain medications that dry out your eyes.  Moderate Persistent Asthma: - Controller Inhaler: Continue Symbicort  160 mcg 2 puffs twice a day; This Should Be Used Everyday - Rinse mouth out after use - Spiriva  2 puffs twice daily - During respiratory illness or asthma flares: Increase Symbicort  160 mcg  3 puffs twice daily  and continue for 2 weeks or until symptoms resolve. - Rescue Inhaler: Albuterol  (Proair /Ventolin ) 2 puffs . Or 1 vial via nebulizer.  Use  every 4-6 hours as needed for chest tightness, wheezing, or coughing.   Can also use 15 minutes prior to exercise if you have symptoms with activity. - Asthma is not controlled if:  - Symptoms are occurring >2 times a week OR  - >2 times a month nighttime awakenings  - You are requiring systemic steroids (prednisone /steroid injections) more than once per year  - Your require hospitalization for your asthma.  - Please call the clinic to schedule a follow up if these symptoms arise Avoid smoke exposure Stay up-to-date with your annual flu vaccines, COVID vaccines and pneumonia vaccines when indicated.    Follow up :  months, sooner if needed.      Control of Mold Allergen   Mold and fungi can grow on a  variety of surfaces provided certain temperature and moisture conditions exist.  Outdoor molds grow on plants, decaying vegetation and soil.  The major outdoor mold, Alternaria and Cladosporium, are found in very high numbers during hot and dry conditions.  Generally, a late Summer - Fall peak is seen for common outdoor fungal spores.  Rain will temporarily lower outdoor mold spore count, but counts rise rapidly when the rainy period ends.  The most important indoor molds are Aspergillus and Penicillium.  Dark, humid and poorly ventilated basements are ideal sites for mold growth.  The next most common sites of mold growth are the bathroom and the kitchen.  Outdoor (Seasonal) Mold Control  Use air conditioning and keep windows closed Avoid exposure to decaying vegetation. Avoid leaf raking. Avoid grain handling. Consider wearing a face mask if working in moldy areas.    Indoor (Perennial) Mold Control   Maintain humidity below 50%. Clean washable surfaces with 5% bleach solution. Remove sources e.g. contaminated carpets.   Control of Dog or Cat Allergen  Avoidance is the best way to manage a dog or cat allergy . If you have a dog or cat and are allergic to dog or cats, consider removing the dog or cat from the home. If you have a dog or cat but dont want to find it a new home, or if your family wants a pet even though someone in the household is allergic, here are some strategies that may help keep symptoms at bay:  Keep the pet out of  your bedroom and restrict it to only a few rooms. Be advised that keeping the dog or cat in only one room will not limit the allergens to that room. Dont pet, hug or kiss the dog or cat; if you do, wash your hands with soap and water. High-efficiency particulate air (HEPA) cleaners run continuously in a bedroom or living room can reduce allergen levels over time. Regular use of a high-efficiency vacuum cleaner or a central vacuum can reduce allergen  levels. Giving your dog or cat a bath at least once a week can reduce airborne allergen. DUST MITE AVOIDANCE MEASURES:  There are three main measures that need and can be taken to avoid house dust mites:  Reduce accumulation of dust in general -reduce furniture, clothing, carpeting, books, stuffed animals, especially in bedroom  Separate yourself from the dust -use pillow and mattress encasements (can be found at stores such as [employer] or online) -avoid direct exposure to air condition flow -use a HEPA filter device, especially in the bedroom; you can also use a HEPA filter vacuum cleaner -wipe dust with a moist towel instead of a dry towel or broom when cleaning  Decrease mites and/or their secretions -wash clothing and linen and stuffed animals at highest temperature possible, at least every 2 weeks -stuffed animals can also be placed in a bag and put in a freezer overnight  Despite the above measures, it is impossible to eliminate dust mites or their allergen completely from your home.  With the above measures the burden of mites in your home can be diminished, with the goal of minimizing your allergic symptoms.  Success will be reached only when implementing and using all means together. Reducing Pollen Exposure  The American Academy of Allergy , Asthma and Immunology suggests the following steps to reduce your exposure to pollen during allergy  seasons.    Do not hang sheets or clothing out to dry; pollen may collect on these items. Do not mow lawns or spend time around freshly cut grass; mowing stirs up pollen. Keep windows closed at night.  Keep car windows closed while driving. Minimize morning activities outdoors, a time when pollen counts are usually at their highest. Stay indoors as much as possible when pollen counts or humidity is high and on windy days when pollen tends to remain in the air longer. Use air conditioning when possible.  Many air conditioners have  filters that trap the pollen spores. Use a HEPA room air filter to remove pollen form the indoor air you breathe.

## 2024-07-24 ENCOUNTER — Ambulatory Visit: Admitting: Family

## 2024-07-30 NOTE — Patient Instructions (Incomplete)
 Seasonal and Perennial Allergic:  - allergy  testing 2024 positive to Johnson grass, indoor and outdoor molds, dust mite, cat, dog - Prevention:  - allergen avoidance when possible - once asthma controlled, consider allergy  shots as long term control of your symptoms by teaching your immune system to be more tolerant of your allergy  triggers once asthma controlled - Symptom control: - May use Dymista  1 spray in each nostril twice a day for nasal congestion/itchy nose - Continue Claritin  (Loratadine ) 10mg  daily as needed. Can take two doses for breakthrough allergies  Allergic Conjunctivitis: - Continue Allergy  Eye drops-pataday  eye drops- 1 drop each eye daily as needed  -Avoid eye drops that say red eye relief as they may contain medications that dry out your eyes.  Moderate Persistent Asthma: Continue Xolair  injections per protocol and have access to your epinephrine  auto injector device - Controller Inhaler: Continue Symbicort  160 mcg 2 puffs twice a day; This Should Be Used Everyday - Rinse mouth out after use - Spiriva  Respimat 2 puffs once daily - During respiratory illness or asthma flares: Increase Symbicort  160 mcg  3 puffs twice daily  and continue for 2 weeks or until symptoms resolve. - Rescue Inhaler: Albuterol  (Proair /Ventolin ) 2 puffs . Or 1 vial via nebulizer.  Use  every 4-6 hours as needed for chest tightness, wheezing, or coughing.   Can also use 15 minutes prior to exercise if you have symptoms with activity. - Asthma is not controlled if:  - Symptoms are occurring >2 times a week OR  - >2 times a month nighttime awakenings  - You are requiring systemic steroids (prednisone /steroid injections) more than once per year  - Your require hospitalization for your asthma.  - Please call the clinic to schedule a follow up if these symptoms arise Avoid smoke exposure Stay up-to-date with your annual flu vaccines, COVID vaccines and pneumonia vaccines when  indicated.    Follow up :  months, sooner if needed.      Control of Mold Allergen   Mold and fungi can grow on a variety of surfaces provided certain temperature and moisture conditions exist.  Outdoor molds grow on plants, decaying vegetation and soil.  The major outdoor mold, Alternaria and Cladosporium, are found in very high numbers during hot and dry conditions.  Generally, a late Summer - Fall peak is seen for common outdoor fungal spores.  Rain will temporarily lower outdoor mold spore count, but counts rise rapidly when the rainy period ends.  The most important indoor molds are Aspergillus and Penicillium.  Dark, humid and poorly ventilated basements are ideal sites for mold growth.  The next most common sites of mold growth are the bathroom and the kitchen.  Outdoor (Seasonal) Mold Control  Use air conditioning and keep windows closed Avoid exposure to decaying vegetation. Avoid leaf raking. Avoid grain handling. Consider wearing a face mask if working in moldy areas.    Indoor (Perennial) Mold Control   Maintain humidity below 50%. Clean washable surfaces with 5% bleach solution. Remove sources e.g. contaminated carpets.   Control of Dog or Cat Allergen  Avoidance is the best way to manage a dog or cat allergy . If you have a dog or cat and are allergic to dog or cats, consider removing the dog or cat from the home. If you have a dog or cat but dont want to find it a new home, or if your family wants a pet even though someone in the household is allergic, here are  some strategies that may help keep symptoms at bay:  Keep the pet out of your bedroom and restrict it to only a few rooms. Be advised that keeping the dog or cat in only one room will not limit the allergens to that room. Dont pet, hug or kiss the dog or cat; if you do, wash your hands with soap and water. High-efficiency particulate air (HEPA) cleaners run continuously in a bedroom or living room can reduce  allergen levels over time. Regular use of a high-efficiency vacuum cleaner or a central vacuum can reduce allergen levels. Giving your dog or cat a bath at least once a week can reduce airborne allergen. DUST MITE AVOIDANCE MEASURES:  There are three main measures that need and can be taken to avoid house dust mites:  Reduce accumulation of dust in general -reduce furniture, clothing, carpeting, books, stuffed animals, especially in bedroom  Separate yourself from the dust -use pillow and mattress encasements (can be found at stores such as [employer] or online) -avoid direct exposure to air condition flow -use a HEPA filter device, especially in the bedroom; you can also use a HEPA filter vacuum cleaner -wipe dust with a moist towel instead of a dry towel or broom when cleaning  Decrease mites and/or their secretions -wash clothing and linen and stuffed animals at highest temperature possible, at least every 2 weeks -stuffed animals can also be placed in a bag and put in a freezer overnight  Despite the above measures, it is impossible to eliminate dust mites or their allergen completely from your home.  With the above measures the burden of mites in your home can be diminished, with the goal of minimizing your allergic symptoms.  Success will be reached only when implementing and using all means together. Reducing Pollen Exposure  The American Academy of Allergy , Asthma and Immunology suggests the following steps to reduce your exposure to pollen during allergy  seasons.    Do not hang sheets or clothing out to dry; pollen may collect on these items. Do not mow lawns or spend time around freshly cut grass; mowing stirs up pollen. Keep windows closed at night.  Keep car windows closed while driving. Minimize morning activities outdoors, a time when pollen counts are usually at their highest. Stay indoors as much as possible when pollen counts or humidity is high and on windy  days when pollen tends to remain in the air longer. Use air conditioning when possible.  Many air conditioners have filters that trap the pollen spores. Use a HEPA room air filter to remove pollen form the indoor air you breathe.

## 2024-07-31 ENCOUNTER — Encounter: Payer: Self-pay | Admitting: Family

## 2024-07-31 ENCOUNTER — Ambulatory Visit (INDEPENDENT_AMBULATORY_CARE_PROVIDER_SITE_OTHER): Admitting: Family

## 2024-07-31 ENCOUNTER — Ambulatory Visit: Admitting: Family

## 2024-07-31 VITALS — BP 122/80 | HR 95 | Temp 98.5°F | Resp 20 | Wt 152.0 lb

## 2024-07-31 DIAGNOSIS — J3089 Other allergic rhinitis: Secondary | ICD-10-CM

## 2024-07-31 DIAGNOSIS — J302 Other seasonal allergic rhinitis: Secondary | ICD-10-CM

## 2024-07-31 DIAGNOSIS — J4540 Moderate persistent asthma, uncomplicated: Secondary | ICD-10-CM

## 2024-07-31 DIAGNOSIS — H1013 Acute atopic conjunctivitis, bilateral: Secondary | ICD-10-CM

## 2024-07-31 MED ORDER — AZELASTINE-FLUTICASONE 137-50 MCG/ACT NA SUSP: 1.0000 | Freq: Two times a day (BID) | NASAL | 5 refills | Status: AC | PRN

## 2024-07-31 MED ORDER — SPIRIVA RESPIMAT 1.25 MCG/ACT IN AERS: 2.0000 | INHALATION_SPRAY | Freq: Every day | RESPIRATORY_TRACT | 5 refills | Status: AC

## 2024-07-31 MED ORDER — BUDESONIDE-FORMOTEROL FUMARATE 160-4.5 MCG/ACT IN AERO: 2.0000 | INHALATION_SPRAY | Freq: Two times a day (BID) | RESPIRATORY_TRACT | 5 refills | Status: AC

## 2024-07-31 MED ORDER — EPINEPHRINE 0.3 MG/0.3ML IJ SOAJ: 0.3000 mg | INTRAMUSCULAR | 1 refills | Status: AC | PRN

## 2024-07-31 NOTE — Progress Notes (Signed)
 "  [address] [place] [address] Dept: [phone]  FOLLOW UP NOTE  Patient ID: [name], female    DOB: 1981/03/19  Age: 44 y.o. MRN: [id number] Date of Office Visit: 07/31/2024  Assessment  Chief Complaint: Allergic Rhinitis  and Asthma  HPI [name] is a 44 year old female who presents today for follow-up of acute cough, moderate persistent asthma with acute exacerbation, seasonal and perennial allergic rhinitis, and allergic conjunctivitis of both eyes.  She was last seen on May 22, 2024 by myself.  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  She is also getting tested for H pylori soon due to bloating.  Seasonal and perennial allergic rhinitis: She feels like the Dymista  nasal spray along with Claritin  10 mg twice a day really helps.  She reports nasal congestion lately and denies rhinorrhea.  Her postnasal drip is not as often.  She feels like it is more mucus that she coughs up.  She has not been treated for any other sinus infections other than the 1 I treated at the last office visit.  Allergic conjunctivitis: She reports that her eyes have not been so bad.  She does have the Pataday  eyedrops to use as needed.  Moderate persistent asthma: She reports that she normally takes her Symbicort  160/4.5 mcg 2 puffs twice a day, but she will realize that she forgot to take it if she coughs.  She also continues to take Spiriva  2 puffs once a day and receives Xolair  injections per protocol.  She denies any problems or reactions with her Xolair  injections.  She does feel like they do help.  With the weather going back and forth she will have some breathing symptoms.  Her breathing is better in colder weather.  She also tries to change the air filter every 1 to 2 months.  She is using her albuterol  right now maybe once or twice a week.  Since her last office visit she has not made any trips to the emergency room or urgent  care due to breathing problems.  She has not received any other steroids than the ones I prescribed at her last office visit.  She has not needed to increase her Symbicort  to 3 puffs twice a day for 2 weeks with any respiratory illnesses/flares since we last saw her.     Drug Allergies:  Allergies[1]  Review of Systems: Negative except as per HPI   Physical Exam: BP 122/80 (BP Location: Right Arm, Patient Position: Sitting, Cuff Size: Normal)   Pulse 95   Temp 98.5 F (36.9 C) (Oral)   Resp 20   Wt 152 lb (68.9 kg)   SpO2 95%   BMI 28.72 kg/m    Physical Exam Constitutional:      Appearance: Normal appearance.  HENT:     Head: Normocephalic and atraumatic.     Comments: Negative except as per HPI    Right Ear: Tympanic membrane, ear canal and external ear normal.     Left Ear: Tympanic membrane, ear canal and external ear normal.     Nose: Nose normal.     Mouth/Throat:     Mouth: Mucous membranes are moist.     Pharynx: Oropharynx is clear.  Eyes:     Conjunctiva/sclera: Conjunctivae normal.  Cardiovascular:     Rate and Rhythm: Regular rhythm.     Heart sounds: Normal heart sounds.  Pulmonary:  Effort: Pulmonary effort is normal.     Breath sounds: Normal breath sounds.     Comments: Lungs clear to auscultation Musculoskeletal:     Cervical back: Neck supple.  Skin:    General: Skin is warm.  Neurological:     Mental Status: She is alert and oriented to person, place, and time.  Psychiatric:        Mood and Affect: Mood normal.        Behavior: Behavior normal.        Thought Content: Thought content normal.        Judgment: Judgment normal.     Diagnostics: FVC 3.12 L (67%), FEV1 2.62 L (71%), FEV1/FVC 0.84.  Spirometry indicates possible restrictive defect.  Assessment and Plan: 1. Seasonal and perennial allergic rhinitis   2. Moderate persistent asthma without complication   3. Allergic conjunctivitis of both eyes     Meds ordered this  encounter  Medications   Azelastine -Fluticasone  (DYMISTA ) 137-50 MCG/ACT SUSP    Sig: Place 1 spray into both nostrils 2 (two) times daily as needed.    Dispense:  23 g    Refill:  5   budesonide -formoterol  (SYMBICORT ) 160-4.5 MCG/ACT inhaler    Sig: Inhale 2 puffs into the lungs in the morning and at bedtime. Use with spacer.  Rinse mouth out afterward    Dispense:  1 each    Refill:  5   Tiotropium Bromide (SPIRIVA  RESPIMAT) 1.25 MCG/ACT AERS    Sig: Inhale 2 puffs into the lungs daily.    Dispense:  4 g    Refill:  5   EPINEPHrine  0.3 mg/0.3 mL IJ SOAJ injection    Sig: Inject 0.3 mg into the muscle as needed for anaphylaxis.    Dispense:  2 each    Refill:  1    Patient Instructions  Seasonal and Perennial Allergic: Better control - allergy  testing 2024 positive to Johnson grass, indoor and outdoor molds, dust mite, cat, dog - Prevention:  - allergen avoidance when possible - once asthma controlled, consider allergy  shots as long term control of your symptoms by teaching your immune system to be more tolerant of your allergy  triggers once asthma controlled - Symptom control: - May use Dymista  1 spray in each nostril twice a day for nasal congestion/itchy nose - Continue Claritin  (Loratadine ) 10mg  daily as needed. Can take two doses for breakthrough allergies  Allergic Conjunctivitis: - Continue Allergy  Eye drops-pataday  eye drops- 1 drop each eye daily as needed  -Avoid eye drops that say red eye relief as they may contain medications that dry out your eyes.  Moderate Persistent Asthma: Better control with colder weather Continue Xolair  injections per protocol and have access to your epinephrine  auto injector device.  Demonstration given.  Prescription sent for EpiPen  - Controller Inhaler: Continue Symbicort  160 mcg 2 puffs twice a day; This Should Be Used Everyday - Rinse mouth out after use - Spiriva  Respimat 2 puffs once daily - During respiratory illness or asthma  flares: Increase Symbicort  160 mcg  3 puffs twice daily  and continue for 2 weeks or until symptoms resolve. - Rescue Inhaler: Albuterol  (Proair /Ventolin ) 2 puffs . Or 1 vial via nebulizer.  Use  every 4-6 hours as needed for chest tightness, wheezing, or coughing.   Can also use 15 minutes prior to exercise if you have symptoms with activity. - Asthma is not controlled if:  - Symptoms are occurring >2 times a week OR  - >2 times a  month nighttime awakenings  - You are requiring systemic steroids (prednisone /steroid injections) more than once per year  - Your require hospitalization for your asthma.  - Please call the clinic to schedule a follow up if these symptoms arise Avoid smoke exposure Stay up-to-date with your annual flu vaccines, COVID vaccines and pneumonia vaccines when indicated.    Follow up : 3-4 months, sooner if needed.      Control of Mold Allergen   Mold and fungi can grow on a variety of surfaces provided certain temperature and moisture conditions exist.  Outdoor molds grow on plants, decaying vegetation and soil.  The major outdoor mold, Alternaria and Cladosporium, are found in very high numbers during hot and dry conditions.  Generally, a late Summer - Fall peak is seen for common outdoor fungal spores.  Rain will temporarily lower outdoor mold spore count, but counts rise rapidly when the rainy period ends.  The most important indoor molds are Aspergillus and Penicillium.  Dark, humid and poorly ventilated basements are ideal sites for mold growth.  The next most common sites of mold growth are the bathroom and the kitchen.  Outdoor (Seasonal) Mold Control  Use air conditioning and keep windows closed Avoid exposure to decaying vegetation. Avoid leaf raking. Avoid grain handling. Consider wearing a face mask if working in moldy areas.    Indoor (Perennial) Mold Control   Maintain humidity below 50%. Clean washable surfaces with 5% bleach solution. Remove  sources e.g. contaminated carpets.   Control of Dog or Cat Allergen  Avoidance is the best way to manage a dog or cat allergy . If you have a dog or cat and are allergic to dog or cats, consider removing the dog or cat from the home. If you have a dog or cat but dont want to find it a new home, or if your family wants a pet even though someone in the household is allergic, here are some strategies that may help keep symptoms at bay:  Keep the pet out of your bedroom and restrict it to only a few rooms. Be advised that keeping the dog or cat in only one room will not limit the allergens to that room. Dont pet, hug or kiss the dog or cat; if you do, wash your hands with soap and water. High-efficiency particulate air (HEPA) cleaners run continuously in a bedroom or living room can reduce allergen levels over time. Regular use of a high-efficiency vacuum cleaner or a central vacuum can reduce allergen levels. Giving your dog or cat a bath at least once a week can reduce airborne allergen. DUST MITE AVOIDANCE MEASURES:  There are three main measures that need and can be taken to avoid house dust mites:  Reduce accumulation of dust in general -reduce furniture, clothing, carpeting, books, stuffed animals, especially in bedroom  Separate yourself from the dust -use pillow and mattress encasements (can be found at stores such as [employer] or online) -avoid direct exposure to air condition flow -use a HEPA filter device, especially in the bedroom; you can also use a HEPA filter vacuum cleaner -wipe dust with a moist towel instead of a dry towel or broom when cleaning  Decrease mites and/or their secretions -wash clothing and linen and stuffed animals at highest temperature possible, at least every 2 weeks -stuffed animals can also be placed in a bag and put in a freezer overnight  Despite the above measures, it is impossible to eliminate dust mites or their  allergen completely from  your home.  With the above measures the burden of mites in your home can be diminished, with the goal of minimizing your allergic symptoms.  Success will be reached only when implementing and using all means together. Reducing Pollen Exposure  The American Academy of Allergy , Asthma and Immunology suggests the following steps to reduce your exposure to pollen during allergy  seasons.    Do not hang sheets or clothing out to dry; pollen may collect on these items. Do not mow lawns or spend time around freshly cut grass; mowing stirs up pollen. Keep windows closed at night.  Keep car windows closed while driving. Minimize morning activities outdoors, a time when pollen counts are usually at their highest. Stay indoors as much as possible when pollen counts or humidity is high and on windy days when pollen tends to remain in the air longer. Use air conditioning when possible.  Many air conditioners have filters that trap the pollen spores. Use a HEPA room air filter to remove pollen form the indoor air you breathe.  Return in about 4 months (around 11/28/2024).    Thank you for the opportunity to care for this patient.  Please do not hesitate to contact me with questions.  Wanda Craze, FNP [facility]         [1]  Allergies Allergen Reactions   Penicillins Anaphylaxis   Depakote  Er [Divalproex  Sodium Er] Other (See Comments)     Hx recurrent pancreatitis   Lactose Intolerance (Gi)    Metformin  Diarrhea   Pristiq [Desvenlafaxine] Hives   "

## 2024-07-31 NOTE — Patient Instructions (Addendum)
 Seasonal and Perennial Allergic: Better control - allergy  testing 2024 positive to [place] grass, indoor and outdoor molds, dust mite, cat, dog - Prevention:  - allergen avoidance when possible - once asthma controlled, consider allergy  shots as long term control of your symptoms by teaching your immune system to be more tolerant of your allergy  triggers once asthma controlled - Symptom control: - May use Dymista  1 spray in each nostril twice a day for nasal congestion/itchy nose - Continue Claritin  (Loratadine ) 10mg  daily as needed. Can take two doses for breakthrough allergies  Allergic Conjunctivitis: - Continue Allergy  Eye drops-pataday  eye drops- 1 drop each eye daily as needed  -Avoid eye drops that say red eye relief as they may contain medications that dry out your eyes.  Moderate Persistent Asthma: Better control with colder weather Continue Xolair  injections per protocol and have access to your epinephrine  auto injector device.  Demonstration given.  Prescription sent for EpiPen  - Controller Inhaler: Continue Symbicort  160 mcg 2 puffs twice a day; This Should Be Used Everyday - Rinse mouth out after use - Spiriva  Respimat 2 puffs once daily - During respiratory illness or asthma flares: Increase Symbicort  160 mcg  3 puffs twice daily  and continue for 2 weeks or until symptoms resolve. - Rescue Inhaler: Albuterol  (Proair /Ventolin ) 2 puffs . Or 1 vial via nebulizer.  Use  every 4-6 hours as needed for chest tightness, wheezing, or coughing.   Can also use 15 minutes prior to exercise if you have symptoms with activity. - Asthma is not controlled if:  - Symptoms are occurring >2 times a week OR  - >2 times a month nighttime awakenings  - You are requiring systemic steroids (prednisone /steroid injections) more than once per year  - Your require hospitalization for your asthma.  - Please call the clinic to schedule a follow up if these symptoms arise Avoid smoke exposure Stay  up-to-date with your annual flu vaccines, COVID vaccines and pneumonia vaccines when indicated.    Follow up : 3-4 months, sooner if needed.      Control of Mold Allergen   Mold and fungi can grow on a variety of surfaces provided certain temperature and moisture conditions exist.  Outdoor molds grow on plants, decaying vegetation and soil.  The major outdoor mold, Alternaria and Cladosporium, are found in very high numbers during hot and dry conditions.  Generally, a late Summer - Fall peak is seen for common outdoor fungal spores.  Rain will temporarily lower outdoor mold spore count, but counts rise rapidly when the rainy period ends.  The most important indoor molds are Aspergillus and Penicillium.  Dark, humid and poorly ventilated basements are ideal sites for mold growth.  The next most common sites of mold growth are the bathroom and the kitchen.  Outdoor (Seasonal) Mold Control  Use air conditioning and keep windows closed Avoid exposure to decaying vegetation. Avoid leaf raking. Avoid grain handling. Consider wearing a face mask if working in moldy areas.    Indoor (Perennial) Mold Control   Maintain humidity below 50%. Clean washable surfaces with 5% bleach solution. Remove sources e.g. contaminated carpets.   Control of Dog or Cat Allergen  Avoidance is the best way to manage a dog or cat allergy . If you have a dog or cat and are allergic to dog or cats, consider removing the dog or cat from the home. If you have a dog or cat but dont want to find it a new home, or if your  family wants a pet even though someone in the household is allergic, here are some strategies that may help keep symptoms at bay:  Keep the pet out of your bedroom and restrict it to only a few rooms. Be advised that keeping the dog or cat in only one room will not limit the allergens to that room. Dont pet, hug or kiss the dog or cat; if you do, wash your hands with soap and water. High-efficiency  particulate air (HEPA) cleaners run continuously in a bedroom or living room can reduce allergen levels over time. Regular use of a high-efficiency vacuum cleaner or a central vacuum can reduce allergen levels. Giving your dog or cat a bath at least once a week can reduce airborne allergen. DUST MITE AVOIDANCE MEASURES:  There are three main measures that need and can be taken to avoid house dust mites:  Reduce accumulation of dust in general -reduce furniture, clothing, carpeting, books, stuffed animals, especially in bedroom  Separate yourself from the dust -use pillow and mattress encasements (can be found at stores such as [employer] or online) -avoid direct exposure to air condition flow -use a HEPA filter device, especially in the bedroom; you can also use a HEPA filter vacuum cleaner -wipe dust with a moist towel instead of a dry towel or broom when cleaning  Decrease mites and/or their secretions -wash clothing and linen and stuffed animals at highest temperature possible, at least every 2 weeks -stuffed animals can also be placed in a bag and put in a freezer overnight  Despite the above measures, it is impossible to eliminate dust mites or their allergen completely from your home.  With the above measures the burden of mites in your home can be diminished, with the goal of minimizing your allergic symptoms.  Success will be reached only when implementing and using all means together. Reducing Pollen Exposure  The [employer] , [employer] suggests the following steps to reduce your exposure to pollen during allergy  seasons.    Do not hang sheets or clothing out to dry; pollen may collect on these items. Do not mow lawns or spend time around freshly cut grass; mowing stirs up pollen. Keep windows closed at night.  Keep car windows closed while driving. Minimize morning activities outdoors, a time when pollen counts are usually at their  highest. Stay indoors as much as possible when pollen counts or humidity is high and on windy days when pollen tends to remain in the air longer. Use air conditioning when possible.  Many air conditioners have filters that trap the pollen spores. Use a HEPA room air filter to remove pollen form the indoor air you breathe.

## 2024-08-01 ENCOUNTER — Other Ambulatory Visit: Payer: Self-pay

## 2024-08-01 ENCOUNTER — Other Ambulatory Visit: Payer: Self-pay | Admitting: *Deleted

## 2024-08-01 ENCOUNTER — Other Ambulatory Visit: Payer: Self-pay | Admitting: Internal Medicine

## 2024-08-01 MED ORDER — AZELASTINE HCL 0.1 % NA SOLN: 1.0000 | Freq: Two times a day (BID) | NASAL | 5 refills | Status: AC | PRN

## 2024-08-01 MED ORDER — FLUTICASONE PROPIONATE 50 MCG/ACT NA SUSP: 2.0000 | Freq: Every day | NASAL | 5 refills | Status: AC

## 2024-08-01 NOTE — Telephone Encounter (Signed)
 Received PA request for generic Dymista . Sending in separately.

## 2024-08-05 ENCOUNTER — Other Ambulatory Visit: Payer: Self-pay

## 2024-08-05 ENCOUNTER — Other Ambulatory Visit (HOSPITAL_COMMUNITY): Payer: Self-pay

## 2024-08-05 MED ORDER — OMALIZUMAB 150 MG/ML ~~LOC~~ SOSY
150.0000 mg | PREFILLED_SYRINGE | SUBCUTANEOUS | 11 refills | Status: AC
  Filled 2024-08-05 (×2): qty 1, 28d supply, fill #0

## 2024-08-08 ENCOUNTER — Other Ambulatory Visit: Payer: Self-pay

## 2024-08-14 ENCOUNTER — Ambulatory Visit

## 2024-11-28 ENCOUNTER — Ambulatory Visit: Admitting: Family
